# Patient Record
Sex: Male | Born: 1955 | Race: White | Hispanic: No | Marital: Married | Smoking: Former smoker
Health system: Southern US, Community
[De-identification: ages and names within clinical notes are randomized; demographics above are authoritative.]

## PROBLEM LIST (undated history)

## (undated) DIAGNOSIS — T8859XA Other complications of anesthesia, initial encounter: Secondary | ICD-10-CM

## (undated) DIAGNOSIS — C61 Malignant neoplasm of prostate: Secondary | ICD-10-CM

## (undated) DIAGNOSIS — K219 Gastro-esophageal reflux disease without esophagitis: Secondary | ICD-10-CM

## (undated) DIAGNOSIS — I10 Essential (primary) hypertension: Secondary | ICD-10-CM

## (undated) DIAGNOSIS — C801 Malignant (primary) neoplasm, unspecified: Secondary | ICD-10-CM

## (undated) HISTORY — PX: OTHER SURGICAL HISTORY: SHX169

## (undated) HISTORY — PX: EYE SURGERY: SHX253

## (undated) HISTORY — PX: FRACTURE SURGERY: SHX138

## (undated) HISTORY — PX: TONSILLECTOMY: SUR1361

---

## 2010-09-11 DIAGNOSIS — Z8719 Personal history of other diseases of the digestive system: Secondary | ICD-10-CM | POA: Insufficient documentation

## 2010-09-11 DIAGNOSIS — C61 Malignant neoplasm of prostate: Secondary | ICD-10-CM | POA: Insufficient documentation

## 2010-09-11 DIAGNOSIS — C159 Malignant neoplasm of esophagus, unspecified: Secondary | ICD-10-CM | POA: Insufficient documentation

## 2010-09-11 DIAGNOSIS — A6000 Herpesviral infection of urogenital system, unspecified: Secondary | ICD-10-CM | POA: Insufficient documentation

## 2012-03-15 DIAGNOSIS — Z7409 Other reduced mobility: Secondary | ICD-10-CM | POA: Insufficient documentation

## 2012-03-15 DIAGNOSIS — S32001A Stable burst fracture of unspecified lumbar vertebra, initial encounter for closed fracture: Secondary | ICD-10-CM | POA: Insufficient documentation

## 2012-03-15 DIAGNOSIS — W19XXXA Unspecified fall, initial encounter: Secondary | ICD-10-CM | POA: Insufficient documentation

## 2012-03-15 DIAGNOSIS — S52509A Unspecified fracture of the lower end of unspecified radius, initial encounter for closed fracture: Secondary | ICD-10-CM | POA: Insufficient documentation

## 2012-03-15 DIAGNOSIS — S92002A Unspecified fracture of left calcaneus, initial encounter for closed fracture: Secondary | ICD-10-CM | POA: Insufficient documentation

## 2012-03-15 DIAGNOSIS — B3781 Candidal esophagitis: Secondary | ICD-10-CM | POA: Insufficient documentation

## 2012-03-15 DIAGNOSIS — S92251A Displaced fracture of navicular [scaphoid] of right foot, initial encounter for closed fracture: Secondary | ICD-10-CM | POA: Insufficient documentation

## 2012-03-17 DIAGNOSIS — T07XXXA Unspecified multiple injuries, initial encounter: Secondary | ICD-10-CM | POA: Insufficient documentation

## 2012-09-23 DIAGNOSIS — S32009A Unspecified fracture of unspecified lumbar vertebra, initial encounter for closed fracture: Secondary | ICD-10-CM | POA: Insufficient documentation

## 2013-09-08 DIAGNOSIS — H26499 Other secondary cataract, unspecified eye: Secondary | ICD-10-CM | POA: Insufficient documentation

## 2013-11-04 DIAGNOSIS — M62838 Other muscle spasm: Secondary | ICD-10-CM | POA: Insufficient documentation

## 2013-11-04 DIAGNOSIS — K2100 Gastro-esophageal reflux disease with esophagitis, without bleeding: Secondary | ICD-10-CM | POA: Insufficient documentation

## 2018-01-15 DIAGNOSIS — I639 Cerebral infarction, unspecified: Secondary | ICD-10-CM

## 2018-01-15 HISTORY — DX: Cerebral infarction, unspecified: I63.9

## 2018-02-10 ENCOUNTER — Ambulatory Visit: Payer: BC Managed Care – PPO | Admitting: Gastroenterology

## 2018-02-10 ENCOUNTER — Other Ambulatory Visit: Payer: Self-pay

## 2018-02-10 VITALS — BP 155/89 | HR 59 | Wt 213.8 lb

## 2018-02-10 DIAGNOSIS — R1310 Dysphagia, unspecified: Secondary | ICD-10-CM

## 2018-02-10 NOTE — Progress Notes (Signed)
Andrew Bellows MD, MRCP(U.K) [place]  [place], [place] [address]  Main: [phone]  Fax: [phone]   Gastroenterology Consultation  Referring Provider:     Ellamae Sia, MD Primary Care Physician:  Andrew Sia, MD Primary Gastroenterologist:  Dr. Jonathon Mullins  Reason for Consultation:     GERD        HPI:   Andrew Mullins is a 63 y.o. y/o male referred for consultation & management  by Dr. Quay Mullins, Andrew Dach, MD.    She has been referred for history of acid reflux and esophageal stricture.  Prostate cancer 2004- did well . 2010 diagnosed with esophageal cancer. Not a long term smoker. Says he had a high grade tumor - was treated as head and neck , Was too risky for surgery . Underwent chemo and radiation. Cant recall any lymph nodes being affected. Completed treatment in 2010 . He was treated and followed up by Dr Andrew Mullins at [place] [facility] , last seen 3 years back. Says as a side effect of the chemo radiation wasan esophageal stricture and has had about 42 EGD's so far. Was having one every two months and would need to be dilated.   Says presently for the past 7 months since last EGD by Dr Andrew Mullins in [place] where had steroid injection x 2.  Since then he felt good for a long time.   Presently says cant swallow meats- just been eating softer foods.    No past medical history on file.    Prior to Admission medications   Not on File    No family history on file.   Social History   Tobacco Use  . Smoking status: Not on file  Substance Use Topics  . Alcohol use: Not on file  . Drug use: Not on file    Allergies as of 02/10/2018  . (No Known Allergies)    Review of Systems:    All systems reviewed and negative except where noted in HPI.   Physical Exam:  BP (!) 155/89   Pulse (!) 59   Wt 213 lb 12.8 oz (97 kg)  No LMP for male patient. Psych:  Alert and cooperative. Normal mood and affect. General:   Alert,  Well-developed,  well-nourished, pleasant and cooperative in NAD Head:  Normocephalic and atraumatic. Eyes:  Sclera clear, no icterus.   Conjunctiva pink. Ears:  Normal auditory acuity. Nose:  No deformity, discharge, or lesions. Mouth:  No deformity or lesions,oropharynx pink & moist. Neck:  Supple; no masses or thyromegaly. Lungs:  Respirations even and unlabored.  Clear throughout to auscultation.   No wheezes, crackles, or rhonchi. No acute distress. Heart:  Regular rate and rhythm; no murmurs, clicks, rubs, or gallops. Abdomen:  Normal bowel sounds.  No bruits.  Soft, non-tender and non-distended without masses, hepatosplenomegaly or hernias noted.  No guarding or rebound tenderness.    Neurologic:  Alert and oriented x3;  grossly normal neurologically. Skin:  Intact without significant lesions or rashes. No jaundice. Lymph Nodes:  No significant cervical adenopathy. Psych:  Alert and cooperative. Normal mood and affect.  Imaging Studies: No results found.  Assessment and Plan:   Andrew Mullins is a 63 y.o. y/o male has been referred for GERD. His main issue is dysphagia which based on his history of multiple dilations at [facility] is likely from radiation for esophageal cancer causing a stricture.   Plan  1. EGD+ dilation  2. Obtain all records from last  GI office 3. Will confirm at next visit need for colonoscopy    I have discussed alternative options, risks & benefits,  which include, but are not limited to, bleeding, infection, perforation,respiratory complication & drug reaction.  The patient agrees with this plan & written consent will be obtained.    Follow up in 8 weeks   Dr Andrew Bellows MD,MRCP(U.K)

## 2018-02-18 ENCOUNTER — Ambulatory Visit: Payer: BC Managed Care – PPO | Admitting: Anesthesiology

## 2018-02-18 ENCOUNTER — Encounter
Admission: RE | Disposition: A | Discharge status: Home / Self Care | Payer: Self-pay | Source: Home / Self Care | Attending: Gastroenterology

## 2018-02-18 ENCOUNTER — Encounter: Payer: Self-pay | Admitting: Anesthesiology

## 2018-02-18 ENCOUNTER — Ambulatory Visit
Admission: RE | Admit: 2018-02-18 | Discharge: 2018-02-18 | Disposition: A | Discharge status: Home / Self Care | Payer: BC Managed Care – PPO | Attending: Gastroenterology | Admitting: Gastroenterology

## 2018-02-18 DIAGNOSIS — I10 Essential (primary) hypertension: Secondary | ICD-10-CM | POA: Diagnosis not present

## 2018-02-18 DIAGNOSIS — K219 Gastro-esophageal reflux disease without esophagitis: Secondary | ICD-10-CM | POA: Insufficient documentation

## 2018-02-18 DIAGNOSIS — K222 Esophageal obstruction: Secondary | ICD-10-CM | POA: Insufficient documentation

## 2018-02-18 DIAGNOSIS — Z8501 Personal history of malignant neoplasm of esophagus: Secondary | ICD-10-CM | POA: Diagnosis not present

## 2018-02-18 DIAGNOSIS — Z87891 Personal history of nicotine dependence: Secondary | ICD-10-CM | POA: Insufficient documentation

## 2018-02-18 DIAGNOSIS — Z79899 Other long term (current) drug therapy: Secondary | ICD-10-CM | POA: Insufficient documentation

## 2018-02-18 DIAGNOSIS — R1310 Dysphagia, unspecified: Secondary | ICD-10-CM | POA: Diagnosis not present

## 2018-02-18 HISTORY — DX: Gastro-esophageal reflux disease without esophagitis: K21.9

## 2018-02-18 HISTORY — DX: Essential (primary) hypertension: I10

## 2018-02-18 HISTORY — PX: ESOPHAGOGASTRODUODENOSCOPY (EGD) WITH PROPOFOL: SHX5813

## 2018-02-18 SURGERY — ESOPHAGOGASTRODUODENOSCOPY (EGD) WITH PROPOFOL
Anesthesia: General

## 2018-02-18 MED ORDER — GLYCOPYRROLATE 0.2 MG/ML IJ SOLN
INTRAMUSCULAR | Status: DC | PRN
  Administered 2018-02-18: 0.2 mg via INTRAVENOUS

## 2018-02-18 MED ORDER — LIDOCAINE HCL (CARDIAC) PF 100 MG/5ML IV SOSY
PREFILLED_SYRINGE | INTRAVENOUS | Status: DC | PRN
  Administered 2018-02-18: 60 mg via INTRAVENOUS

## 2018-02-18 MED ORDER — PROPOFOL 10 MG/ML IV BOLUS
INTRAVENOUS | Status: DC | PRN
  Administered 2018-02-18: 70 mg via INTRAVENOUS

## 2018-02-18 MED ORDER — PROPOFOL 500 MG/50ML IV EMUL
INTRAVENOUS | Status: DC | PRN
  Administered 2018-02-18: 140 ug/kg/min via INTRAVENOUS

## 2018-02-18 MED ORDER — SODIUM CHLORIDE 0.9 % IV SOLN
INTRAVENOUS | Status: DC
  Administered 2018-02-18: 1000 mL via INTRAVENOUS

## 2018-02-18 NOTE — Anesthesia Post-op Follow-up Note (Signed)
Anesthesia QCDR form completed.        

## 2018-02-18 NOTE — Transfer of Care (Signed)
Immediate Anesthesia Transfer of Care Note  Patient: Andrew Mullins  Procedure(s) Performed: ESOPHAGOGASTRODUODENOSCOPY (EGD) WITH PROPOFOL (N/A )  Patient Location: PACU  Anesthesia Type:General  Level of Consciousness: sedated  Airway & Oxygen Therapy: Patient Spontanous Breathing and Patient connected to nasal cannula oxygen  Post-op Assessment: Report given to RN and Post -op Vital signs reviewed and stable  Post vital signs: Reviewed and stable  Last Vitals:  Vitals Value Taken Time  BP 143/104 02/18/2018  9:53 AM  Temp 36.1 C 02/18/2018  9:53 AM  Pulse 75 02/18/2018  9:54 AM  Resp 14 02/18/2018  9:54 AM  SpO2 99 % 02/18/2018  9:54 AM  Vitals shown include unvalidated device data.  Last Pain:  Vitals:   02/18/18 0953  TempSrc:   PainSc: 0-No pain         Complications: No apparent anesthesia complications

## 2018-02-18 NOTE — Anesthesia Preprocedure Evaluation (Addendum)
Anesthesia Evaluation  Patient identified by MRN, date of birth, ID band Patient awake    Reviewed: Allergy & Precautions, NPO status , Patient's Chart, lab work & pertinent test results, reviewed documented beta blocker date and time   Airway Mallampati: II  TM Distance: >3 FB     Dental  (+) Chipped   Pulmonary former smoker,           Cardiovascular hypertension, Pt. on home beta blockers      Neuro/Psych    GI/Hepatic   Endo/Other    Renal/GU      Musculoskeletal   Abdominal   Peds  Hematology   Anesthesia Other Findings Esophageal Ca with radiation and multiple dilations.  Reproductive/Obstetrics                            Anesthesia Physical Anesthesia Plan  ASA: II  Anesthesia Plan: General   Post-op Pain Management:    Induction: Intravenous  PONV Risk Score and Plan:   Airway Management Planned:   Additional Equipment:   Intra-op Plan:   Post-operative Plan:   Informed Consent: I have reviewed the patients History and Physical, chart, labs and discussed the procedure including the risks, benefits and alternatives for the proposed anesthesia with the patient or authorized representative who has indicated his/her understanding and acceptance.       Plan Discussed with: CRNA  Anesthesia Plan Comments:         Anesthesia Quick Evaluation

## 2018-02-18 NOTE — Anesthesia Postprocedure Evaluation (Signed)
Anesthesia Post Note  Patient: VIC ESCO  Procedure(s) Performed: ESOPHAGOGASTRODUODENOSCOPY (EGD) WITH PROPOFOL (N/A )  Patient location during evaluation: Endoscopy Anesthesia Type: General Level of consciousness: awake and alert Pain management: pain level controlled Vital Signs Assessment: post-procedure vital signs reviewed and stable Respiratory status: spontaneous breathing, nonlabored ventilation, respiratory function stable and patient connected to nasal cannula oxygen Cardiovascular status: blood pressure returned to baseline and stable Postop Assessment: no apparent nausea or vomiting Anesthetic complications: no     Last Vitals:  Vitals:   02/18/18 0953 02/18/18 1012  BP: (!) 143/104 (!) 176/98  Pulse: 85   Resp: 18   Temp: (!) 36.1 C   SpO2: 99%     Last Pain:  Vitals:   02/18/18 1022  TempSrc:   PainSc: 0-No pain                 Beaux Wedemeyer S

## 2018-02-18 NOTE — Op Note (Signed)
[facility] Gastroenterology Patient Name: Andrew Mullins Procedure Date: 02/18/2018 9:36 AM MRN: [id number] Account #: [id number] Date of Birth: [birth year] Admit Type: Outpatient Age: 63 Room: [facility] ENDO ROOM 1 Gender: Male Note Status: Finalized Procedure:            Upper GI endoscopy Indications:          Dysphagia Providers:            [name] MD, MD Referring MD:         Remus Blake MD, MD (Referring MD) Medicines:            Monitored Anesthesia Care Complications:        No immediate complications. Procedure:            Pre-Anesthesia Assessment:                       - Prior to the procedure, a History and Physical was                        performed, and patient medications, allergies and                        sensitivities were reviewed. The patient's tolerance of                        previous anesthesia was reviewed.                       - The risks and benefits of the procedure and the                        sedation options and risks were discussed with the                        patient. All questions were answered and informed                        consent was obtained.                       - ASA Grade Assessment: II - A patient with mild                        systemic disease.                       After obtaining informed consent, the endoscope was                        passed under direct vision. Throughout the procedure,                        the patient's blood pressure, pulse, and oxygen                        saturations were monitored continuously. The Endoscope                        was introduced through the mouth, and advanced to the  third part of duodenum. The upper GI endoscopy was                        accomplished with ease. The patient tolerated the                        procedure well. Findings:      The examined duodenum was normal.      The stomach was normal.      The cardia and  gastric fundus were normal on retroflexion.      One benign-appearing, intrinsic moderate (circumferential scarring or       stenosis; an endoscope may pass) stenosis was found 25 cm from the       incisors. This stenosis measured 1.1 cm (inner diameter) x 1 cm (in       length). The stenosis was traversed. A TTS dilator was passed through       the scope. Dilation with a 12-13.5-15 mm balloon dilator was performed       to 13.5 mm. The dilation site was examined and showed complete       resolution of luminal narrowing. Impression:           - Normal examined duodenum.                       - Normal stomach.                       - Benign-appearing esophageal stenosis. Dilated.                       - No specimens collected. Recommendation:       - Discharge patient to home (with escort).                       - Resume previous diet.                       - Continue present medications.                       - Repeat upper endoscopy in 2 weeks to evaluate the                        response to therapy. Procedure Code(s):    --- Professional ---                       [phone], Esophagogastroduodenoscopy, flexible, transoral;                        with transendoscopic balloon dilation of esophagus                        (less than 30 mm diameter) Diagnosis Code(s):    --- Professional ---                       K22.2, Esophageal obstruction                       R13.10, Dysphagia, unspecified CPT copyright 2018 American Medical Association. All rights reserved. The codes documented in this report are preliminary and upon coder review may  be revised to  meet current compliance requirements. [name], MD [name] MD, MD 02/18/2018 9:50:01 AM This report has been signed electronically. Number of Addenda: 0 Note Initiated On: 02/18/2018 9:36 AM      [facility]

## 2018-02-19 ENCOUNTER — Encounter: Payer: Self-pay | Admitting: Gastroenterology

## 2018-02-20 NOTE — H&P (Signed)
Andrew Bellows, MD [address], [place], [place], [place], [address] [address], [place], [place], [place], [address] Phone: [phone]  Fax: [phone]  Primary Care Physician:  Ellamae Sia, MD   Pre-Procedure History & Physical: HPI:  Andrew Mullins is a 63 y.o. male is here for an endoscopy    Past Medical History:  Diagnosis Date  . GERD (gastroesophageal reflux disease)   . Hypertension     Past Surgical History:  Procedure Laterality Date  . broken bones repair    . ESOPHAGOGASTRODUODENOSCOPY (EGD) WITH PROPOFOL N/A 02/18/2018   Procedure: ESOPHAGOGASTRODUODENOSCOPY (EGD) WITH PROPOFOL;  Surgeon: Andrew Bellows, MD;  Location: [facility] ENDOSCOPY;  Service: Gastroenterology;  Laterality: N/A;  . EYE SURGERY     retinal detatchment  . FRACTURE SURGERY    . radical prostate    . TONSILLECTOMY      Prior to Admission medications   Medication Sig Start Date End Date Taking? Authorizing Provider  famciclovir (FAMVIR) 250 MG tablet Take 250 mg by mouth 2 (two) times daily.   Yes [provider]  losartan (COZAAR) 25 MG tablet Take 25 mg by mouth daily.   Yes [provider]  metoprolol tartrate (LOPRESSOR) 25 MG tablet Take 25 mg by mouth 2 (two) times daily.   Yes [provider]  omeprazole (PRILOSEC) 20 MG capsule Take 20 mg by mouth daily.   Yes [provider]    Allergies as of 02/10/2018  . (No Known Allergies)    History reviewed. No pertinent family history.  Social History   Socioeconomic History  . Marital status: Married    Spouse name: Not on file  . Number of children: Not on file  . Years of education: Not on file  . Highest education level: Not on file  Occupational History  . Not on file  Social Needs  . Financial resource strain: Not on file  . Food insecurity:    Worry: Not on file    Inability: Not on file  . Transportation needs:    Medical: Not on file    Non-medical: Not on file    Tobacco Use  . Smoking status: Former Research scientist (life sciences)  . Smokeless tobacco: Never Used  Substance and Sexual Activity  . Alcohol use: Not on file  . Drug use: Not on file  . Sexual activity: Not on file  Lifestyle  . Physical activity:    Days per week: Not on file    Minutes per session: Not on file  . Stress: Not on file  Relationships  . Social connections:    Talks on phone: Not on file    Gets together: Not on file    Attends religious service: Not on file    Active member of club or organization: Not on file    Attends meetings of clubs or organizations: Not on file    Relationship status: Not on file  . Intimate partner violence:    Fear of current or ex partner: Not on file    Emotionally abused: Not on file    Physically abused: Not on file    Forced sexual activity: Not on file  Other Topics Concern  . Not on file  Social History Narrative  . Not on file    Review of Systems: See HPI, otherwise negative ROS  Physical Exam: BP (!) 176/98   Pulse 85   Temp (!) 97 F (36.1 C)   Resp 18  Ht 6' (1.829 m)   Wt 96.9 kg   SpO2 99%   BMI 28.97 kg/m  General:   Alert,  pleasant and cooperative in NAD Head:  Normocephalic and atraumatic. Neck:  Supple; no masses or thyromegaly. Lungs:  Clear throughout to auscultation, normal respiratory effort.    Heart:  +S1, +S2, Regular rate and rhythm, No edema. Abdomen:  Soft, nontender and nondistended. Normal bowel sounds, without guarding, and without rebound.   Neurologic:  Alert and  oriented x4;  grossly normal neurologically.  Impression/Plan: Andrew Mullins is here for an endoscopy  to be performed for  evaluation of dysphagia    Risks, benefits, limitations, and alternatives regarding endoscopy and dialtion have been reviewed with the patient.  Questions have been answered.  All parties agreeable.   Andrew Bellows, MD  02/20/2018, 8:16 AM

## 2018-02-21 ENCOUNTER — Telehealth: Payer: Self-pay

## 2018-02-21 NOTE — Telephone Encounter (Signed)
Called pt to schedule a repeat EGD. Pt states he has to confirm he and his wife's availability, he plans to contact our office when he's ready to schedule.

## 2018-02-21 NOTE — Telephone Encounter (Signed)
-----   Message from [name], MD sent at 02/18/2018 10:00 AM EST ----- Regarding: please arrange appointment  Andrew Mullins  Please arrange EGD with dilation in 2 weeks    Regards    Dr [name]  Gastroenterology/Hepatology Pager: [phone]

## 2018-03-26 ENCOUNTER — Telehealth: Payer: Self-pay | Admitting: Gastroenterology

## 2018-03-26 ENCOUNTER — Other Ambulatory Visit: Payer: Self-pay

## 2018-03-26 DIAGNOSIS — R1310 Dysphagia, unspecified: Secondary | ICD-10-CM

## 2018-03-26 NOTE — Telephone Encounter (Signed)
Spoke with pt and was able to schedule the repeat EGD procedure as instructed by Dr. Vicente Males. Pt plans to pick up the printed instructions as well as sign a medical release form to receive records from his previous GI provider.

## 2018-03-26 NOTE — Telephone Encounter (Signed)
Pt left vm he missed a call he think it may be to schedule an apt

## 2018-04-01 ENCOUNTER — Other Ambulatory Visit: Payer: Self-pay

## 2018-04-01 ENCOUNTER — Ambulatory Visit: Payer: BC Managed Care – PPO | Admitting: Anesthesiology

## 2018-04-01 ENCOUNTER — Encounter
Admission: RE | Disposition: A | Discharge status: Home / Self Care | Payer: Self-pay | Source: Home / Self Care | Attending: Gastroenterology

## 2018-04-01 ENCOUNTER — Ambulatory Visit
Admission: RE | Admit: 2018-04-01 | Discharge: 2018-04-01 | Disposition: A | Discharge status: Home / Self Care | Payer: BC Managed Care – PPO | Attending: Gastroenterology | Admitting: Gastroenterology

## 2018-04-01 DIAGNOSIS — I10 Essential (primary) hypertension: Secondary | ICD-10-CM | POA: Diagnosis not present

## 2018-04-01 DIAGNOSIS — Z79899 Other long term (current) drug therapy: Secondary | ICD-10-CM | POA: Diagnosis not present

## 2018-04-01 DIAGNOSIS — K222 Esophageal obstruction: Secondary | ICD-10-CM | POA: Diagnosis not present

## 2018-04-01 DIAGNOSIS — R1310 Dysphagia, unspecified: Secondary | ICD-10-CM | POA: Insufficient documentation

## 2018-04-01 DIAGNOSIS — K219 Gastro-esophageal reflux disease without esophagitis: Secondary | ICD-10-CM | POA: Insufficient documentation

## 2018-04-01 DIAGNOSIS — Z87891 Personal history of nicotine dependence: Secondary | ICD-10-CM | POA: Insufficient documentation

## 2018-04-01 HISTORY — PX: ESOPHAGOGASTRODUODENOSCOPY (EGD) WITH PROPOFOL: SHX5813

## 2018-04-01 SURGERY — ESOPHAGOGASTRODUODENOSCOPY (EGD) WITH PROPOFOL
Anesthesia: General

## 2018-04-01 MED ORDER — LIDOCAINE HCL (PF) 2 % IJ SOLN
INTRAMUSCULAR | Status: AC
  Filled 2018-04-01: qty 10

## 2018-04-01 MED ORDER — FENTANYL CITRATE (PF) 100 MCG/2ML IJ SOLN
INTRAMUSCULAR | Status: AC
  Filled 2018-04-01: qty 2

## 2018-04-01 MED ORDER — GLYCOPYRROLATE 0.2 MG/ML IJ SOLN
INTRAMUSCULAR | Status: DC | PRN
  Administered 2018-04-01: 0.2 mg via INTRAVENOUS

## 2018-04-01 MED ORDER — FENTANYL CITRATE (PF) 100 MCG/2ML IJ SOLN
INTRAMUSCULAR | Status: DC | PRN
  Administered 2018-04-01: 50 ug via INTRAVENOUS

## 2018-04-01 MED ORDER — LIDOCAINE HCL (CARDIAC) PF 100 MG/5ML IV SOSY
PREFILLED_SYRINGE | INTRAVENOUS | Status: DC | PRN
  Administered 2018-04-01: 80 mg via INTRAVENOUS

## 2018-04-01 MED ORDER — PROPOFOL 10 MG/ML IV BOLUS
INTRAVENOUS | Status: DC | PRN
  Administered 2018-04-01: 50 mg via INTRAVENOUS
  Administered 2018-04-01 (×2): 20 mg via INTRAVENOUS

## 2018-04-01 MED ORDER — PROPOFOL 500 MG/50ML IV EMUL
INTRAVENOUS | Status: AC
  Filled 2018-04-01: qty 50

## 2018-04-01 MED ORDER — SODIUM CHLORIDE 0.9 % IV SOLN
INTRAVENOUS | Status: DC
  Administered 2018-04-01: 10:00:00 via INTRAVENOUS

## 2018-04-01 NOTE — Op Note (Signed)
[facility] Gastroenterology Patient Name: Andrew Mullins Procedure Date: 04/01/2018 10:20 AM MRN: [id number] Account #: [id number] Date of Birth: 1955/02/16 Admit Type: Outpatient Age: 63 Room: [facility] ENDO ROOM 1 Gender: Male Note Status: Finalized Procedure:            Upper GI endoscopy Indications:          Dysphagia Providers:            [name] MD, MD Medicines:            Monitored Anesthesia Care Complications:        No immediate complications. Procedure:            Pre-Anesthesia Assessment:                       - Prior to the procedure, a History and Physical was                        performed, and patient medications, allergies and                        sensitivities were reviewed. The patient's tolerance of                        previous anesthesia was reviewed.                       - The risks and benefits of the procedure and the                        sedation options and risks were discussed with the                        patient. All questions were answered and informed                        consent was obtained.                       - ASA Grade Assessment: II - A patient with mild                        systemic disease.                       After obtaining informed consent, the endoscope was                        passed under direct vision. Throughout the procedure,                        the patient's blood pressure, pulse, and oxygen                        saturations were monitored continuously. The Endoscope                        was introduced through the mouth, and advanced to the                        third part of duodenum. The upper GI endoscopy was  accomplished with ease. The patient tolerated the                        procedure well. Findings:      One benign-appearing, intrinsic moderate (circumferential scarring or       stenosis; an endoscope may pass) stenosis was found 30 cm from the      incisors. This stenosis measured 1.4 cm (inner diameter) x less than one       cm (in length). The stenosis was traversed. A TTS dilator was passed       through the scope. Dilation with a 12-13.5-15 mm balloon dilator was       performed to 15 mm. The dilation site was examined and showed complete       resolution of luminal narrowing.      The stomach was normal.      The examined duodenum was normal.      The cardia and gastric fundus were normal on retroflexion. Impression:           - Benign-appearing esophageal stenosis. Dilated.                       - Normal stomach.                       - Normal examined duodenum.                       - No specimens collected. Recommendation:       - Discharge patient to home (with escort).                       - Resume previous diet.                       - Continue present medications.                       - Return to my office PRN. Procedure Code(s):    --- Professional ---                       [phone], Esophagogastroduodenoscopy, flexible, transoral;                        with transendoscopic balloon dilation of esophagus                        (less than 30 mm diameter) Diagnosis Code(s):    --- Professional ---                       K22.2, Esophageal obstruction                       R13.10, Dysphagia, unspecified CPT copyright 2018 American Medical Association. All rights reserved. The codes documented in this report are preliminary and upon coder review may  be revised to meet current compliance requirements. [name], MD [name] MD, MD 04/01/2018 10:39:53 AM This report has been signed electronically. Number of Addenda: 0 Note Initiated On: 04/01/2018 10:20 AM      [facility]

## 2018-04-01 NOTE — H&P (Signed)
[name], MD [address] [place], [place], [place], [address] [address], [place], [place], [place], [address] Phone: [phone]  Fax: [phone]  Primary Care Physician:  Ellamae Sia, MD   Pre-Procedure History & Physical: HPI:  Andrew Mullins is a 63 y.o. male is here for an endoscopy    Past Medical History:  Diagnosis Date  . GERD (gastroesophageal reflux disease)   . Hypertension     Past Surgical History:  Procedure Laterality Date  . broken bones repair    . ESOPHAGOGASTRODUODENOSCOPY (EGD) WITH PROPOFOL N/A 02/18/2018   Procedure: ESOPHAGOGASTRODUODENOSCOPY (EGD) WITH PROPOFOL;  Surgeon: [name], MD;  Location: [facility] ENDOSCOPY;  Service: Gastroenterology;  Laterality: N/A;  . EYE SURGERY     retinal detatchment  . FRACTURE SURGERY    . radical prostate    . TONSILLECTOMY      Prior to Admission medications   Medication Sig Start Date End Date Taking? Authorizing Provider  famciclovir (FAMVIR) 250 MG tablet Take 250 mg by mouth 2 (two) times daily.   Yes [provider]  losartan (COZAAR) 25 MG tablet Take 25 mg by mouth daily.   Yes [provider]  metoprolol tartrate (LOPRESSOR) 25 MG tablet Take 25 mg by mouth 2 (two) times daily.   Yes [provider]  omeprazole (PRILOSEC) 20 MG capsule Take 20 mg by mouth daily.   Yes [provider]    Allergies as of 03/26/2018  . (No Known Allergies)    No family history on file.  Social History   Socioeconomic History  . Marital status: Married    Spouse name: Not on file  . Number of children: Not on file  . Years of education: Not on file  . Highest education level: Not on file  Occupational History  . Not on file  Social Needs  . Financial resource strain: Not on file  . Food insecurity:    Worry: Not on file    Inability: Not on file  . Transportation needs:    Medical: Not on file    Non-medical: Not on file  Tobacco Use  . Smoking  status: Former Research scientist (life sciences)  . Smokeless tobacco: Never Used  Substance and Sexual Activity  . Alcohol use: Not on file  . Drug use: Not on file  . Sexual activity: Not on file  Lifestyle  . Physical activity:    Days per week: Not on file    Minutes per session: Not on file  . Stress: Not on file  Relationships  . Social connections:    Talks on phone: Not on file    Gets together: Not on file    Attends religious service: Not on file    Active member of club or organization: Not on file    Attends meetings of clubs or organizations: Not on file    Relationship status: Not on file  . Intimate partner violence:    Fear of current or ex partner: Not on file    Emotionally abused: Not on file    Physically abused: Not on file    Forced sexual activity: Not on file  Other Topics Concern  . Not on file  Social History Narrative  . Not on file    Review of Systems: See HPI, otherwise negative ROS  Physical Exam: BP (!) 159/95   Pulse (!) 59   Temp (!) 96 F (35.6 C) (Tympanic)   Resp 16  Ht 6' (1.829 m)   Wt 93 kg   SpO2 100%   BMI 27.80 kg/m  General:   Alert,  pleasant and cooperative in NAD Head:  Normocephalic and atraumatic. Neck:  Supple; no masses or thyromegaly. Lungs:  Clear throughout to auscultation, normal respiratory effort.    Heart:  +S1, +S2, Regular rate and rhythm, No edema. Abdomen:  Soft, nontender and nondistended. Normal bowel sounds, without guarding, and without rebound.   Neurologic:  Alert and  oriented x4;  grossly normal neurologically.  Impression/Plan: Andrew Mullins is here for an endoscopy  to be performed for  evaluation of dysphagia     Risks, benefits, limitations, and alternatives regarding endoscopy and dilation have been reviewed with the patient.  Questions have been answered.  All parties agreeable.   [name], MD  04/01/2018, 9:56 AM

## 2018-04-01 NOTE — Anesthesia Post-op Follow-up Note (Signed)
Anesthesia QCDR form completed.        

## 2018-04-01 NOTE — Transfer of Care (Signed)
Immediate Anesthesia Transfer of Care Note  Patient: Andrew Mullins  Procedure(s) Performed: ESOPHAGOGASTRODUODENOSCOPY (EGD) WITH PROPOFOL (N/A )  Patient Location: PACU  Anesthesia Type:General  Level of Consciousness: awake, alert  and oriented  Airway & Oxygen Therapy: Patient Spontanous Breathing and Patient connected to nasal cannula oxygen  Post-op Assessment: Report given to RN and Post -op Vital signs reviewed and stable  Post vital signs: Reviewed and stable  Last Vitals:  Vitals Value Taken Time  BP 141/90 04/01/2018 10:47 AM  Temp 35.7 C 04/01/2018 10:40 AM  Pulse 60 04/01/2018 10:48 AM  Resp 13 04/01/2018 10:48 AM  SpO2 98 % 04/01/2018 10:48 AM  Vitals shown include unvalidated device data.  Last Pain:  Vitals:   04/01/18 1040  TempSrc: Tympanic  PainSc:          Complications: No apparent anesthesia complications

## 2018-04-01 NOTE — Anesthesia Preprocedure Evaluation (Signed)
Anesthesia Evaluation  Patient identified by MRN, date of birth, ID band Patient awake    Reviewed: Allergy & Precautions, NPO status , Patient's Chart, lab work & pertinent test results  History of Anesthesia Complications Negative for: history of anesthetic complications  Airway Mallampati: III  TM Distance: >3 FB Neck ROM: Full    Dental no notable dental hx.    Pulmonary neg sleep apnea, neg COPD, former smoker,    breath sounds clear to auscultation- rhonchi (-) wheezing      Cardiovascular hypertension, Pt. on medications (-) CAD, (-) Past MI, (-) Cardiac Stents and (-) CABG  Rhythm:Regular Rate:Normal - Systolic murmurs and - Diastolic murmurs    Neuro/Psych neg Seizures negative neurological ROS  negative psych ROS   GI/Hepatic Neg liver ROS, GERD  ,  Endo/Other  negative endocrine ROSneg diabetes  Renal/GU negative Renal ROS     Musculoskeletal negative musculoskeletal ROS (+)   Abdominal (+) - obese,   Peds  Hematology negative hematology ROS (+)   Anesthesia Other Findings Past Medical History: No date: GERD (gastroesophageal reflux disease) No date: Hypertension   Reproductive/Obstetrics                             Anesthesia Physical Anesthesia Plan  ASA: II  Anesthesia Plan: General   Post-op Pain Management:    Induction: Intravenous  PONV Risk Score and Plan: 1 and Propofol infusion  Airway Management Planned: Natural Airway  Additional Equipment:   Intra-op Plan:   Post-operative Plan:   Informed Consent: I have reviewed the patients History and Physical, chart, labs and discussed the procedure including the risks, benefits and alternatives for the proposed anesthesia with the patient or authorized representative who has indicated his/her understanding and acceptance.     Dental advisory given  Plan Discussed with: CRNA and  Anesthesiologist  Anesthesia Plan Comments:         Anesthesia Quick Evaluation

## 2018-04-01 NOTE — Anesthesia Postprocedure Evaluation (Signed)
Anesthesia Post Note  Patient: Andrew Mullins  Procedure(s) Performed: ESOPHAGOGASTRODUODENOSCOPY (EGD) WITH PROPOFOL (N/A )  Patient location during evaluation: Endoscopy Anesthesia Type: General Level of consciousness: awake and alert and oriented Pain management: pain level controlled Vital Signs Assessment: post-procedure vital signs reviewed and stable Respiratory status: spontaneous breathing, nonlabored ventilation and respiratory function stable Cardiovascular status: blood pressure returned to baseline and stable Postop Assessment: no signs of nausea or vomiting Anesthetic complications: no     Last Vitals:  Vitals:   04/01/18 1110 04/01/18 1120  BP: (!) 157/92 (!) 157/93  Pulse: (!) 57 (!) 58  Resp: 15 13  Temp:    SpO2: 98% 100%    Last Pain:  Vitals:   04/01/18 1040  TempSrc: Tympanic  PainSc:                  Andrew Mullins

## 2018-04-02 ENCOUNTER — Encounter: Payer: Self-pay | Admitting: Gastroenterology

## 2018-09-16 ENCOUNTER — Other Ambulatory Visit: Payer: Self-pay | Admitting: Family Medicine

## 2018-09-16 DIAGNOSIS — R270 Ataxia, unspecified: Secondary | ICD-10-CM

## 2018-09-17 ENCOUNTER — Encounter: Payer: Self-pay | Admitting: Emergency Medicine

## 2018-09-17 ENCOUNTER — Other Ambulatory Visit: Payer: Self-pay

## 2018-09-17 ENCOUNTER — Emergency Department: Payer: BC Managed Care – PPO

## 2018-09-17 ENCOUNTER — Observation Stay: Payer: BC Managed Care – PPO

## 2018-09-17 ENCOUNTER — Inpatient Hospital Stay
Admission: EM | Admit: 2018-09-17 | Discharge: 2018-09-18 | DRG: 065 | Disposition: A | Discharge status: Home / Self Care | Payer: BC Managed Care – PPO | Attending: Internal Medicine | Admitting: Internal Medicine

## 2018-09-17 DIAGNOSIS — K219 Gastro-esophageal reflux disease without esophagitis: Secondary | ICD-10-CM | POA: Diagnosis present

## 2018-09-17 DIAGNOSIS — R531 Weakness: Secondary | ICD-10-CM | POA: Insufficient documentation

## 2018-09-17 DIAGNOSIS — K222 Esophageal obstruction: Secondary | ICD-10-CM | POA: Diagnosis present

## 2018-09-17 DIAGNOSIS — Z79899 Other long term (current) drug therapy: Secondary | ICD-10-CM

## 2018-09-17 DIAGNOSIS — R29700 NIHSS score 0: Secondary | ICD-10-CM | POA: Diagnosis present

## 2018-09-17 DIAGNOSIS — Z20828 Contact with and (suspected) exposure to other viral communicable diseases: Secondary | ICD-10-CM | POA: Diagnosis present

## 2018-09-17 DIAGNOSIS — I10 Essential (primary) hypertension: Secondary | ICD-10-CM | POA: Diagnosis present

## 2018-09-17 DIAGNOSIS — Z9079 Acquired absence of other genital organ(s): Secondary | ICD-10-CM

## 2018-09-17 DIAGNOSIS — Z8501 Personal history of malignant neoplasm of esophagus: Secondary | ICD-10-CM

## 2018-09-17 DIAGNOSIS — Z9221 Personal history of antineoplastic chemotherapy: Secondary | ICD-10-CM

## 2018-09-17 DIAGNOSIS — I69351 Hemiplegia and hemiparesis following cerebral infarction affecting right dominant side: Secondary | ICD-10-CM

## 2018-09-17 DIAGNOSIS — Z923 Personal history of irradiation: Secondary | ICD-10-CM

## 2018-09-17 DIAGNOSIS — M21371 Foot drop, right foot: Secondary | ICD-10-CM | POA: Diagnosis present

## 2018-09-17 DIAGNOSIS — Z87891 Personal history of nicotine dependence: Secondary | ICD-10-CM

## 2018-09-17 DIAGNOSIS — I639 Cerebral infarction, unspecified: Secondary | ICD-10-CM | POA: Diagnosis not present

## 2018-09-17 DIAGNOSIS — Z8546 Personal history of malignant neoplasm of prostate: Secondary | ICD-10-CM

## 2018-09-17 HISTORY — DX: Malignant (primary) neoplasm, unspecified: C80.1

## 2018-09-17 LAB — URINALYSIS, COMPLETE (UACMP) WITH MICROSCOPIC
Bacteria, UA: NONE SEEN
Bilirubin Urine: NEGATIVE
Glucose, UA: NEGATIVE mg/dL
Ketones, ur: NEGATIVE mg/dL
Leukocytes,Ua: NEGATIVE
Nitrite: NEGATIVE
Protein, ur: NEGATIVE mg/dL
Specific Gravity, Urine: 1.004 — ABNORMAL LOW (ref 1.005–1.030)
Squamous Epithelial / HPF: NONE SEEN (ref 0–5)
pH: 7 (ref 5.0–8.0)

## 2018-09-17 LAB — CBC WITH DIFFERENTIAL/PLATELET
Abs Immature Granulocytes: 0.02 10*3/uL (ref 0.00–0.07)
Basophils Absolute: 0 10*3/uL (ref 0.0–0.1)
Basophils Relative: 1 %
Eosinophils Absolute: 0.1 10*3/uL (ref 0.0–0.5)
Eosinophils Relative: 2 %
HCT: 40.1 % (ref 39.0–52.0)
Hemoglobin: 13.3 g/dL (ref 13.0–17.0)
Immature Granulocytes: 0 %
Lymphocytes Relative: 21 %
Lymphs Abs: 1.3 10*3/uL (ref 0.7–4.0)
MCH: 28.4 pg (ref 26.0–34.0)
MCHC: 33.2 g/dL (ref 30.0–36.0)
MCV: 85.5 fL (ref 80.0–100.0)
Monocytes Absolute: 0.6 10*3/uL (ref 0.1–1.0)
Monocytes Relative: 11 %
Neutro Abs: 4 10*3/uL (ref 1.7–7.7)
Neutrophils Relative %: 65 %
Platelets: 181 10*3/uL (ref 150–400)
RBC: 4.69 MIL/uL (ref 4.22–5.81)
RDW: 12.8 % (ref 11.5–15.5)
WBC: 6.1 10*3/uL (ref 4.0–10.5)
nRBC: 0 % (ref 0.0–0.2)

## 2018-09-17 LAB — COMPREHENSIVE METABOLIC PANEL
ALT: 10 U/L (ref 0–44)
AST: 15 U/L (ref 15–41)
Albumin: 3.5 g/dL (ref 3.5–5.0)
Alkaline Phosphatase: 52 U/L (ref 38–126)
Anion gap: 6 (ref 5–15)
BUN: 12 mg/dL (ref 8–23)
CO2: 26 mmol/L (ref 22–32)
Calcium: 8.5 mg/dL — ABNORMAL LOW (ref 8.9–10.3)
Chloride: 106 mmol/L (ref 98–111)
Creatinine, Ser: 0.72 mg/dL (ref 0.61–1.24)
GFR calc Af Amer: >60 mL/min (ref >60)
GFR calc non Af Amer: >60 mL/min (ref >60)
Glucose, Bld: 105 mg/dL — ABNORMAL HIGH (ref 70–99)
Potassium: 3.8 mmol/L (ref 3.5–5.1)
Sodium: 138 mmol/L (ref 135–145)
Total Bilirubin: 0.4 mg/dL (ref 0.3–1.2)
Total Protein: 6.5 g/dL (ref 6.5–8.1)

## 2018-09-17 LAB — TROPONIN I (HIGH SENSITIVITY): Troponin I (High Sensitivity): 3 ng/L (ref <18)

## 2018-09-17 LAB — SARS CORONAVIRUS 2 (TAT 6-24 HRS): SARS Coronavirus 2: NEGATIVE

## 2018-09-17 MED ORDER — STROKE: EARLY STAGES OF RECOVERY BOOK
Freq: Once | Status: AC
  Administered 2018-09-17: 17:00:00

## 2018-09-17 MED ORDER — ASPIRIN 81 MG PO CHEW
324.0000 mg | CHEWABLE_TABLET | Freq: Once | ORAL | Status: AC
  Administered 2018-09-17: 14:00:00 324 mg via ORAL
  Filled 2018-09-17: qty 4

## 2018-09-17 MED ORDER — ACETAMINOPHEN 160 MG/5ML PO SOLN
650.0000 mg | ORAL | Status: DC | PRN
  Filled 2018-09-17: qty 20.3

## 2018-09-17 MED ORDER — ASPIRIN EC 325 MG PO TBEC
325.0000 mg | DELAYED_RELEASE_TABLET | Freq: Every day | ORAL | Status: DC
  Administered 2018-09-18: 325 mg via ORAL
  Filled 2018-09-17: qty 1

## 2018-09-17 MED ORDER — ACETAMINOPHEN 650 MG RE SUPP: 650.0000 mg | RECTAL | Status: DC | PRN

## 2018-09-17 MED ORDER — ENOXAPARIN SODIUM 40 MG/0.4ML ~~LOC~~ SOLN
40.0000 mg | SUBCUTANEOUS | Status: DC
  Administered 2018-09-17: 40 mg via SUBCUTANEOUS
  Filled 2018-09-17: qty 0.4

## 2018-09-17 MED ORDER — ACETAMINOPHEN 325 MG PO TABS: 650.0000 mg | ORAL_TABLET | ORAL | Status: DC | PRN

## 2018-09-17 NOTE — Progress Notes (Signed)
Family Meeting Note  Advance Directive:yes  Today a meeting took place with the Patient.     The following clinical team members were present during this meeting:MD  The following were discussed:Patient's diagnosis: Acute right-sided weakness clinical CVA, hypertension, GERD, history of esophageal cancer status post chemoradiation therapy residual esophageal stenosis, history of prostate cancer will be admitted to the hospital for stroke work-up.  Initial CT head is negative.  Treatment plan of care discussed in detail with the patient and his wife at bedside.  They both verbalized understanding of the plan   patient's progosis: Unable to determine and Goals for treatment: Full Code  Wife Macky Lower is the healthcare power of attorney  Additional follow-up to be provided: Hospitalist and neurology  Time spent during discussion:17 min  Nicholes Mango, MD

## 2018-09-17 NOTE — ED Triage Notes (Signed)
Patient presents to ED via POV from home with c/o right sided weakness. Patient reports last known well Sunday night 2300. Patient reports at 0500 on Monday he got up and went to the bathroom and noticed his right leg dragging behind him. A&O x4. No slurred speech. 5/5 bilateral hand grasps noted. Patient reports still feeling "off".

## 2018-09-17 NOTE — ED Provider Notes (Signed)
[facility] Emergency Department Provider Note       Time seen: ----------------------------------------- 11:54 AM on 09/17/2018 -----------------------------------------   I have reviewed the triage vital signs and the nursing notes.  HISTORY   Chief Complaint Transient Ischemic Attack    HPI Andrew Mullins is a 63 y.o. male with a history of cancer, GERD, hypertension who presents to the ED for right-sided weakness.  Patient reports last known well was Sunday night at 11 PM.  Patient reports 5:00 on Monday got up went to the bathroom noticed his right leg was dragging behind him.  She reports feeling off at this point.  Past Medical History:  Diagnosis Date  . Cancer ([place])   . GERD (gastroesophageal reflux disease)   . Hypertension     There are no active problems to display for this patient.   Past Surgical History:  Procedure Laterality Date  . broken bones repair    . ESOPHAGOGASTRODUODENOSCOPY (EGD) WITH PROPOFOL N/A 02/18/2018   Procedure: ESOPHAGOGASTRODUODENOSCOPY (EGD) WITH PROPOFOL;  Surgeon: [name], MD;  Location: [facility] ENDOSCOPY;  Service: Gastroenterology;  Laterality: N/A;  . ESOPHAGOGASTRODUODENOSCOPY (EGD) WITH PROPOFOL N/A 04/01/2018   Procedure: ESOPHAGOGASTRODUODENOSCOPY (EGD) WITH PROPOFOL;  Surgeon: [name], MD;  Location: [facility] ENDOSCOPY;  Service: Gastroenterology;  Laterality: N/A;  . EYE SURGERY     retinal detatchment  . FRACTURE SURGERY    . radical prostate    . TONSILLECTOMY      Allergies Patient has no known allergies.  Social History Social History   Tobacco Use  . Smoking status: Former Research scientist (life sciences)  . Smokeless tobacco: Never Used  Substance Use Topics  . Alcohol use: Not on file  . Drug use: Not on file   Review of Systems Constitutional: Negative for fever. Cardiovascular: Negative for chest pain. Respiratory: Negative for shortness of breath. Gastrointestinal: Negative for abdominal pain,  vomiting and diarrhea. Musculoskeletal: Negative for back pain. Skin: Negative for rash. Neurological: Positive for right-sided weakness  All systems negative/normal/unremarkable except as stated in the HPI  ____________________________________________   PHYSICAL EXAM:  VITAL SIGNS: ED Triage Vitals  Enc Vitals Group     BP 09/17/18 1136 (!) 155/93     Pulse Rate 09/17/18 1136 (!) 57     Resp 09/17/18 1136 16     Temp 09/17/18 1136 98.5 F (36.9 C)     Temp Source 09/17/18 1136 Oral     SpO2 09/17/18 1136 99 %     Weight 09/17/18 1147 210 lb (95.3 kg)     Height 09/17/18 1147 6' (1.829 m)     Head Circumference --      Peak Flow --      Pain Score 09/17/18 1147 0     Pain Loc --      Pain Edu? --      Excl. in [place]? --    Constitutional: Alert and oriented. Well appearing and in no distress. Eyes: Conjunctivae are normal. Normal extraocular movements. Cardiovascular: Normal rate, regular rhythm. No murmurs, rubs, or gallops. Respiratory: Normal respiratory effort without tachypnea nor retractions. Breath sounds are clear and equal bilaterally. No wheezes/rales/rhonchi. Gastrointestinal: Soft and nontender. Normal bowel sounds Musculoskeletal: Nontender with normal range of motion in extremities. No lower extremity tenderness nor edema. Neurologic:  Normal speech and language. No gross focal neurologic deficits are appreciated.  Patient describes intrinsic hand muscle weakness and difficulty moving his arm and right leg but I cannot appreciate any sensory or motor deficits. Skin:  Skin is warm, dry and intact. No rash noted. Psychiatric: Mood and affect are normal. Speech and behavior are normal.  ____________________________________________  EKG: Interpreted by me.  Sinus rhythm rate 52 bpm, septal infarct, nonspecific ST segment changes, normal axis, normal QT  ____________________________________________  ED COURSE:  As part of my medical decision making, I reviewed  the following data within the [facility] History obtained from family if available, nursing notes, old chart and ekg, as well as notes from prior ED visits. Patient presented for right-sided weakness, we will assess with labs and imaging as indicated at this time.   Procedures  Andrew Mullins was evaluated in Emergency Department on 09/17/2018 for the symptoms described in the history of present illness. He was evaluated in the context of the global COVID-19 pandemic, which necessitated consideration that the patient might be at risk for infection with the SARS-CoV-2 virus that causes COVID-19. Institutional protocols and algorithms that pertain to the evaluation of patients at risk for COVID-19 are in a state of rapid change based on information released by regulatory bodies including the CDC and federal and state organizations. These policies and algorithms were followed during the patient's care in the ED.  ____________________________________________   LABS (pertinent positives/negatives)  Labs Reviewed  COMPREHENSIVE METABOLIC PANEL - Abnormal; Notable for the following components:      Result Value   Glucose, Bld 105 (*)    Calcium 8.5 (*)    All other components within normal limits  CBC WITH DIFFERENTIAL/PLATELET  URINALYSIS, COMPLETE (UACMP) WITH MICROSCOPIC  TROPONIN I (HIGH SENSITIVITY)    RADIOLOGY Images were viewed by me CT head IMPRESSION:  1. No acute intracranial hemorrhage.  2. 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  Clinical correlation and further evaluation with MRI recommended.   These results were called by telephone at the time of interpretation  on 09/17/2018 at 12:34 pm to Dr. Lenise Arena , who verbally  acknowledged these results.   ____________________________________________   DIFFERENTIAL DIAGNOSIS   CVA, TIA, metastasis  FINAL ASSESSMENT AND PLAN  CVA   Plan: The patient had presented for  right-sided weakness. Patient's labs did not reveal any acute process. Patient's imaging did reveal a small left-sided infarct which would correlate with his symptoms.  I have started him on aspirin and will discuss with the hospitalist for admission.  Symptoms seem to start on Monday.   Laurence Aly, MD    Note: This note was generated in part or whole with voice recognition software. Voice recognition is usually quite accurate but there are transcription errors that can and very often do occur. I apologize for any typographical errors that were not detected and corrected.     Earleen Newport, MD 09/17/18 1258

## 2018-09-17 NOTE — Consult Note (Signed)
Reason for Consult: R sided weakness  Referring Physician: Dr. Margaretmary Eddy  CC: R sided weakness   HPI: Andrew Mullins is an 63 y.o. male with a known history of prostate cancer status post radical prostatectomy, esophageal cancer status post chemoradiation therapy and frequent endoscopy, GERD, hypertension has been having right-sided weakness starting from Monday.  Patient reports last well-known was on Sunday night at 11 PM.  Pt was raking and went to sleep on Sunday night,  admits that he woke up up at 5 AM on Monday when he went to the bathroom he noticed right leg and right hand weakness associated with headache.  As the right-sided weakness was not going away patient went to his primary care physician at [facility] clinic yesterday who referred him to see the neurologist Dr. Melrose Nakayama today.  CTH with subacute L suborbital BG/periventricular ischemia.    Past Medical History:  Diagnosis Date  . Cancer ([place])   . GERD (gastroesophageal reflux disease)   . Hypertension     Past Surgical History:  Procedure Laterality Date  . broken bones repair    . ESOPHAGOGASTRODUODENOSCOPY (EGD) WITH PROPOFOL N/A 02/18/2018   Procedure: ESOPHAGOGASTRODUODENOSCOPY (EGD) WITH PROPOFOL;  Surgeon: [name], MD;  Location: [facility] ENDOSCOPY;  Service: Gastroenterology;  Laterality: N/A;  . ESOPHAGOGASTRODUODENOSCOPY (EGD) WITH PROPOFOL N/A 04/01/2018   Procedure: ESOPHAGOGASTRODUODENOSCOPY (EGD) WITH PROPOFOL;  Surgeon: [name], MD;  Location: [facility] ENDOSCOPY;  Service: Gastroenterology;  Laterality: N/A;  . EYE SURGERY     retinal detatchment  . FRACTURE SURGERY    . radical prostate    . TONSILLECTOMY      No family history on file.  Social History:  reports that he has quit smoking. He has never used smokeless tobacco. No history on file for alcohol and drug.  No Known Allergies  Medications: I have reviewed the patient's current medications.  ROS: History obtained from the patient  General ROS:  negative for - chills, fatigue, fever, night sweats, weight gain or weight loss Psychological ROS: negative for - behavioral disorder, hallucinations, memory difficulties, mood swings or suicidal ideation Ophthalmic ROS: negative for - blurry vision, double vision, eye pain or loss of vision ENT ROS: negative for - epistaxis, nasal discharge, oral lesions, sore throat, tinnitus or vertigo Allergy and Immunology ROS: negative for - hives or itchy/watery eyes Hematological and Lymphatic ROS: negative for - bleeding problems, bruising or swollen lymph nodes Endocrine ROS: negative for - galactorrhea, hair pattern changes, polydipsia/polyuria or temperature intolerance Respiratory ROS: negative for - cough, hemoptysis, shortness of breath or wheezing Cardiovascular ROS: negative for - chest pain, dyspnea on exertion, edema or irregular heartbeat Gastrointestinal esophageal CA Genito-Urinary ROS: negative for - dysuria, hematuria, incontinence or urinary frequency/urgency Musculoskeletal ROS: negative for - joint swelling or muscular weakness Neurological ROS: as noted in HPI Dermatological ROS: negative for rash and skin lesion changes  Physical Examination: Blood pressure (!) 147/103, pulse (!) 54, temperature 98 F (36.7 C), temperature source Oral, resp. rate 17, height 6' (1.829 m), weight 95.3 kg, SpO2 100 %.    Neurological Examination   Mental Status: Alert, oriented, thought content appropriate.  Speech fluent without evidence of aphasia.  Able to follow 3 step commands without difficulty. Cranial Nerves: II: Discs flat bilaterally; Visual fields grossly normal, pupils equal, round, reactive to light and accommodation III,IV, VI: ptosis not present, extra-ocular motions intact bilaterally V,VII: smile symmetric, facial light touch sensation normal bilaterally VIII: hearing normal bilaterally IX,X: gag reflex present XI: bilateral  shoulder shrug XII: midline tongue  extension Motor: Right : Upper extremity   5/5 R hand 4/5   Left:     Upper extremity   5/5  Lower extremity   5/5 Distal weakness in plantar flexion    Lower extremity   5/5 Tone and bulk:normal tone throughout; no atrophy noted Sensory: Pinprick and light touch intact throughout, bilaterally Deep Tendon Reflexes: 2+ and symmetric throughout Plantars: Right: downgoing   Left: downgoing Cerebellar: normal finger-to-nose, normal rapid alternating movements and normal heel-to-shin test Gait: not tested      Laboratory Studies:   Basic Metabolic Panel: Recent Labs  Lab 09/17/18 1201  NA 138  K 3.8  CL 106  CO2 26  GLUCOSE 105*  BUN 12  CREATININE 0.72  CALCIUM 8.5*    Liver Function Tests: Recent Labs  Lab 09/17/18 1201  AST 15  ALT 10  ALKPHOS 52  BILITOT 0.4  PROT 6.5  ALBUMIN 3.5   No results for input(s): LIPASE, AMYLASE in the last 168 hours. No results for input(s): AMMONIA in the last 168 hours.  CBC: Recent Labs  Lab 09/17/18 1201  WBC 6.1  NEUTROABS 4.0  HGB 13.3  HCT 40.1  MCV 85.5  PLT 181    Cardiac Enzymes: No results for input(s): CKTOTAL, CKMB, CKMBINDEX, TROPONINI in the last 168 hours.  BNP: Invalid input(s): POCBNP  CBG: No results for input(s): GLUCAP in the last 168 hours.  Microbiology: No results found for this or any previous visit.  Coagulation Studies: No results for input(s): LABPROT, INR in the last 72 hours.  Urinalysis: No results for input(s): COLORURINE, LABSPEC, PHURINE, GLUCOSEU, HGBUR, BILIRUBINUR, KETONESUR, PROTEINUR, UROBILINOGEN, NITRITE, LEUKOCYTESUR in the last 168 hours.  Invalid input(s): APPERANCEUR  Lipid Panel:  No results found for: CHOL, TRIG, HDL, CHOLHDL, VLDL, LDLCALC  HgbA1C: No results found for: HGBA1C  Urine Drug Screen:  No results found for: LABOPIA, COCAINSCRNUR, LABBENZ, AMPHETMU, THCU, LABBARB  Alcohol Level: No results for input(s): ETH in the last 168 hours.  Other  results: EKG: normal EKG, normal sinus rhythm, unchanged from previous tracings.  Imaging: Ct Head Wo Contrast  Result Date: 09/17/2018 CLINICAL DATA:  63 year old male with focal neurologic deficit. Possible stroke. Right arm and leg weakness since Monday. EXAM: CT HEAD WITHOUT CONTRAST TECHNIQUE: Contiguous axial images were obtained from the base of the skull through the vertex without intravenous contrast. COMPARISON:  None. FINDINGS: Brain: The ventricles and sulci appropriate size for patient's age. There is a 9 mm hypodense focus in the left periventricular white matter corona radiata (series 2, image 17) consistent with an age indeterminate infarct, possibly subacute. An acute infarct is not excluded. Further evaluation with MRI is recommended. A subcentimeter hypodense focus in the right periventricular white matter is noted. There is no acute intracranial hemorrhage. No mass effect or midline shift no extra-axial fluid collection. Vascular: No hyperdense vessel or unexpected calcification. Skull: Normal. Negative for fracture or focal lesion. Sinuses/Orbits: There is a metallic structure in the right orbit medial to the globe. The visualized paranasal sinuses and mastoid air cells are clear. Other: None IMPRESSION: 1. No acute intracranial hemorrhage. 2. 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 Clinical correlation and further evaluation with MRI recommended. These results were called by telephone at the time of interpretation on 09/17/2018 at 12:34 pm to Dr. Lenise Arena , who verbally acknowledged these results. Electronically Signed   By: Anner Crete M.D.   On: 09/17/2018 12:36  Assessment/Plan:  63 y.o. male with a known history of prostate cancer status post radical prostatectomy, esophageal cancer status post chemoradiation therapy and frequent endoscopy, GERD, hypertension has been having right-sided weakness starting from Monday.  Patient reports last  well-known was on Sunday night at 11 PM.  Pt was raking and went to sleep on Sunday night,  admits that he woke up up at 5 AM on Monday when he went to the bathroom he noticed right leg and right hand weakness associated with headache.  As the right-sided weakness was not going away patient went to his primary care physician at [facility] clinic yesterday who referred him to see the neurologist Dr. Melrose Nakayama today.  CTH with subacute L suborbital BG/periventricular ischemia.    - Suspect subacute stroke in L subcortical are could be causing this as he is 4 days out.   - MRI head, MRA h/n ordered. MRA to make sure no dissection as manual labor on Sunday that could of provoked these symptoms - Will hold off spine imaging at this time as that subcortical infract likely explains it - Agree ASA 325 daily - Pt/ot 09/17/2018, 2:49 PM

## 2018-09-17 NOTE — ED Notes (Signed)
Pt reports that he was sent here from PCP office for further evaluation of possible stroke. Right arm and leg weakness since Monday. Pt able to ambulate to desk without difficulty. Speech appropriate, alert and oriented X 4.

## 2018-09-17 NOTE — H&P (Signed)
[facility] [place] NAME: Andrew Mullins    MR#:  [id number]  DATE OF BIRTH:  09-16-1955  DATE OF ADMISSION:  09/17/2018  PRIMARY CARE PHYSICIAN: Ellamae Sia, MD   REQUESTING/REFERRING PHYSICIAN: Lenise Arena  CHIEF COMPLAINT:  Right-sided weakness  HISTORY OF PRESENT ILLNESS:  Andrew Mullins  is a 63 y.o. male with a known history of prostate cancer status post radical prostatectomy, esophageal cancer status post chemoradiation therapy, GERD, hypertension has been having right-sided weakness starting from Monday.  Patient reports last well-known was on Sunday night at 11 PM.  He admits that he woke up up at 5 AM on Monday when he went to the bathroom he noticed right leg and right hand weakness associated with headache.  As the right-sided weakness was not going away patient went to his primary care physician at [facility] clinic yesterday who referred him to see the neurologist Dr. Melrose Nakayama today.  Patient was seen by Dr. Starleen Blue who sent him to the emergency department immediately.  CT head is negative for acute findings but patient still has right-sided weakness and diminished fine motor skills and having hard time in putting the words together.  Wife at bedside.  No similar numbness in the past  PAST MEDICAL HISTORY:   Past Medical History:  Diagnosis Date  . Cancer ([place])   . GERD (gastroesophageal reflux disease)   . Hypertension     PAST SURGICAL HISTOIRY:   Past Surgical History:  Procedure Laterality Date  . broken bones repair    . ESOPHAGOGASTRODUODENOSCOPY (EGD) WITH PROPOFOL N/A 02/18/2018   Procedure: ESOPHAGOGASTRODUODENOSCOPY (EGD) WITH PROPOFOL;  Surgeon: [name], MD;  Location: [facility] ENDOSCOPY;  Service: Gastroenterology;  Laterality: N/A;  . ESOPHAGOGASTRODUODENOSCOPY (EGD) WITH PROPOFOL N/A 04/01/2018   Procedure: ESOPHAGOGASTRODUODENOSCOPY (EGD) WITH PROPOFOL;  Surgeon: [name], MD;  Location: [facility]  ENDOSCOPY;  Service: Gastroenterology;  Laterality: N/A;  . EYE SURGERY     retinal detatchment  . FRACTURE SURGERY    . radical prostate    . TONSILLECTOMY      SOCIAL HISTORY:   Social History   Tobacco Use  . Smoking status: Former Research scientist (life sciences)  . Smokeless tobacco: Never Used  Substance Use Topics  . Alcohol use: Not on file    FAMILY HISTORY:  No family history on file.  DRUG ALLERGIES:  No Known Allergies  REVIEW OF SYSTEMS:  CONSTITUTIONAL: No fever, fatigue or weakness.  EYES: No blurred or double vision.  EARS, NOSE, AND THROAT: No tinnitus or ear pain.  RESPIRATORY: No cough, shortness of breath, wheezing or hemoptysis.  CARDIOVASCULAR: No chest pain, orthopnea, edema.  GASTROINTESTINAL: No nausea, vomiting, diarrhea or abdominal pain.  GENITOURINARY: No dysuria, hematuria.  ENDOCRINE: No polyuria, nocturia,  HEMATOLOGY: No anemia, easy bruising or bleeding SKIN: No rash or lesion. MUSCULOSKELETAL: No joint pain or arthritis.   NEUROLOGIC: Reports right-sided weakness associated with headache and diminished fine motor skills no tingling, numbness, weakness.  PSYCHIATRY: No anxiety or depression.   MEDICATIONS AT HOME:   Prior to Admission medications   Medication Sig Start Date End Date Taking? Authorizing Provider  famciclovir (FAMVIR) 250 MG tablet Take 250 mg by mouth 2 (two) times daily.   Yes [provider]  ibuprofen (ADVIL) 200 MG tablet Take 400 mg by mouth every 6 (six) hours as needed for mild pain.   Yes [provider]  losartan (COZAAR) 50 MG tablet Take 50 mg by mouth daily.  08/20/18  Yes [provider]  metoprolol tartrate (LOPRESSOR) 25 MG tablet Take 25 mg by mouth 2 (two) times daily.   Yes [provider]  omeprazole (PRILOSEC) 20 MG capsule Take 20 mg by mouth daily.   Yes [provider]      VITAL SIGNS:  Blood pressure (!) 142/92, pulse (!) 58, temperature 98 F (36.7 C), temperature source  Oral, resp. rate 16, height 6' (1.829 m), weight 95.3 kg, SpO2 100 %.  PHYSICAL EXAMINATION:  GENERAL:  63 y.o.-year-old patient lying in the bed with no acute distress.  EYES: Pupils equal, round, reactive to light and accommodation. No scleral icterus. Extraocular muscles intact.  HEENT: Head atraumatic, normocephalic. Oropharynx and nasopharynx clear.  NECK:  Supple, no jugular venous distention. No thyroid enlargement, no tenderness.  LUNGS: Normal breath sounds bilaterally, no wheezing, rales,rhonchi or crepitation. No use of accessory muscles of respiration.  CARDIOVASCULAR: S1, S2 normal. No murmurs, rubs, or gallops.  ABDOMEN: Soft, nontender, nondistended. Bowel sounds present. No organomegaly or mass.  EXTREMITIES: No pedal edema, cyanosis, or clubbing.  NEUROLOGIC: Cranial nerves II through XII are intact. Muscle strength 5/5 in all left-sided extremities.  Right upper and lower extremity 4 out of 5 .sensation intact. Gait not checked.  Diminished fine motor skills PSYCHIATRIC: The patient is alert and oriented x 3.  SKIN: No obvious rash, lesion, or ulcer.   LABORATORY PANEL:   CBC Recent Labs  Lab 09/17/18 1201  WBC 6.1  HGB 13.3  HCT 40.1  PLT 181   ------------------------------------------------------------------------------------------------------------------  Chemistries  Recent Labs  Lab 09/17/18 1201  NA 138  K 3.8  CL 106  CO2 26  GLUCOSE 105*  BUN 12  CREATININE 0.72  CALCIUM 8.5*  AST 15  ALT 10  ALKPHOS 52  BILITOT 0.4   ------------------------------------------------------------------------------------------------------------------  Cardiac Enzymes No results for input(s): TROPONINI in the last 168 hours. ------------------------------------------------------------------------------------------------------------------  RADIOLOGY:  Ct Head Wo Contrast  Result Date: 09/17/2018 CLINICAL DATA:  63 year old male with focal neurologic deficit.  Possible stroke. Right arm and leg weakness since Monday. EXAM: CT HEAD WITHOUT CONTRAST TECHNIQUE: Contiguous axial images were obtained from the base of the skull through the vertex without intravenous contrast. COMPARISON:  None. FINDINGS: Brain: The ventricles and sulci appropriate size for patient's age. There is a 9 mm hypodense focus in the left periventricular white matter corona radiata (series 2, image 17) consistent with an age indeterminate infarct, possibly subacute. An acute infarct is not excluded. Further evaluation with MRI is recommended. A subcentimeter hypodense focus in the right periventricular white matter is noted. There is no acute intracranial hemorrhage. No mass effect or midline shift no extra-axial fluid collection. Vascular: No hyperdense vessel or unexpected calcification. Skull: Normal. Negative for fracture or focal lesion. Sinuses/Orbits: There is a metallic structure in the right orbit medial to the globe. The visualized paranasal sinuses and mastoid air cells are clear. Other: None IMPRESSION: 1. No acute intracranial hemorrhage. 2. 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 Clinical correlation and further evaluation with MRI recommended. These results were called by telephone at the time of interpretation on 09/17/2018 at 12:34 pm to Dr. Lenise Arena , who verbally acknowledged these results. Electronically Signed   By: Anner Crete M.D.   On: 09/17/2018 12:36    EKG:   Orders placed or performed during the hospital encounter of 09/17/18  . ED EKG  . ED EKG  . EKG 12-Lead  . EKG 12-Lead  IMPRESSION AND PLAN:   # clinical CVA Admit to MedSurg unit CT head is negative for acute findings but has age-indeterminate left periventricular white matter infarct Will get MRI of the brain, carotid Dopplers and 2D echocardiogram Bedside swallow evaluation, n.p.o. in the interim Neurology consult, secure chat message sent to Dr. Irish Elders Aspirin  325 mg once daily once patient passes bedside swallow evaluation lipid panel TSH, hemoglobin A1c check PT/OT, speech therapy evaluation  #Hypertension Holding p.o. medications available as well evaluation is pending  #History of esophageal cancer status post chemo and radiation therapy Patient has residual esophageal stenosis Follow-up with gastroenterology for esophageal dilatation as recommended  #History of prostate cancer status post radical hysterectomy  #GERD PPI Provide GI and DVT prophylaxis  All the records are reviewed and case discussed with ED provider. Management plans discussed with the patient, wife at bedside and they are in agreement.  CODE STATUS: fc   TOTAL TIME TAKING CARE OF THIS PATIENT: 45 minutes.   Note: This dictation was prepared with Dragon dictation along with smaller phrase technology. Any transcriptional errors that result from this process are unintentional.  Nicholes Mango M.D on 09/17/2018 at 2:07 PM  Between 7am to 6pm - Pager - [phone]  After 6pm go to [email or web address] - password [email or web address]  Office  [phone]  CC: Primary care physician; Ellamae Sia, MD

## 2018-09-17 NOTE — ED Notes (Signed)
ED TO INPATIENT HANDOFF REPORT  ED Nurse Name and Phone #:  [phone]  S Name/Age/Gender Andrew Mullins 63 y.o. male Room/Bed: ED24A/ED24A  Code Status   Code Status: Not on file  Home/SNF/Other Home Patient oriented to: self, place, time and situation Is this baseline? Yes   Triage Complete: Triage complete  Chief Complaint poss cva  Triage Note Patient presents to ED via POV from home with c/o right sided weakness. Patient reports last known well Sunday night 2300. Patient reports at 0500 on Monday he got up and went to the bathroom and noticed his right leg dragging behind him. A&O x4. No slurred speech. 5/5 bilateral hand grasps noted. Patient reports still feeling "off".    Allergies No Known Allergies  Level of Care/Admitting Diagnosis ED Disposition    ED Disposition Condition [facility] Area: [place] [[id number]]  Level of Care: Med-Surg [16]  Covid Evaluation: Person Under Investigation (PUI)  Diagnosis: [facility]cerebral vascular [id number]  Admitting Physician: Nicholes Mango Alvarado  Attending Physician: Nicholes Mango [[id number]]  Bed request comments: 1c  PT Class (Do Not Modify): Observation [104]  PT Acc Code (Do Not Modify): Observation [10022]       B Medical/Surgery History Past Medical History:  Diagnosis Date  . Cancer ([place])   . GERD (gastroesophageal reflux disease)   . Hypertension    Past Surgical History:  Procedure Laterality Date  . broken bones repair    . ESOPHAGOGASTRODUODENOSCOPY (EGD) WITH PROPOFOL N/A 02/18/2018   Procedure: ESOPHAGOGASTRODUODENOSCOPY (EGD) WITH PROPOFOL;  Surgeon: [name], MD;  Location: [facility] ENDOSCOPY;  Service: Gastroenterology;  Laterality: N/A;  . ESOPHAGOGASTRODUODENOSCOPY (EGD) WITH PROPOFOL N/A 04/01/2018   Procedure: ESOPHAGOGASTRODUODENOSCOPY (EGD) WITH PROPOFOL;  Surgeon: [name], MD;  Location: [facility] ENDOSCOPY;  Service: Gastroenterology;  Laterality: N/A;   . EYE SURGERY     retinal detatchment  . FRACTURE SURGERY    . radical prostate    . TONSILLECTOMY       A IV Location/Drains/Wounds Patient Lines/Drains/Airways Status   Active Line/Drains/Airways    Name:   Placement date:   Placement time:   Site:   Days:   Peripheral IV 09/17/18 Right Antecubital   09/17/18    1207    Antecubital   less than 1          Intake/Output Last 24 hours No intake or output data in the 24 hours ending 09/17/18 1556  Labs/Imaging Results for orders placed or performed during the hospital encounter of 09/17/18 (from the past 48 hour(s))  CBC with Differential     Status: None   Collection Time: 09/17/18 12:01 PM  Result Value Ref Range   WBC 6.1 4.0 - 10.5 K/uL   RBC 4.69 4.22 - 5.81 MIL/uL   Hemoglobin 13.3 13.0 - 17.0 g/dL   HCT 40.1 39.0 - 52.0 %   MCV 85.5 80.0 - 100.0 fL   MCH 28.4 26.0 - 34.0 pg   MCHC 33.2 30.0 - 36.0 g/dL   RDW 12.8 11.5 - 15.5 %   Platelets 181 150 - 400 K/uL   nRBC 0.0 0.0 - 0.2 %   Neutrophils Relative % 65 %   Neutro Abs 4.0 1.7 - 7.7 K/uL   Lymphocytes Relative 21 %   Lymphs Abs 1.3 0.7 - 4.0 K/uL   Monocytes Relative 11 %   Monocytes Absolute 0.6 0.1 - 1.0 K/uL   Eosinophils Relative 2 %  Eosinophils Absolute 0.1 0.0 - 0.5 K/uL   Basophils Relative 1 %   Basophils Absolute 0.0 0.0 - 0.1 K/uL   Immature Granulocytes 0 %   Abs Immature Granulocytes 0.02 0.00 - 0.07 K/uL    Comment: Performed at [facility], [address] [place], [place] [address]  Comprehensive metabolic panel     Status: Abnormal   Collection Time: 09/17/18 12:01 PM  Result Value Ref Range   Sodium 138 135 - 145 mmol/L   Potassium 3.8 3.5 - 5.1 mmol/L   Chloride 106 98 - 111 mmol/L   CO2 26 22 - 32 mmol/L   Glucose, Bld 105 (H) 70 - 99 mg/dL   BUN 12 8 - 23 mg/dL   Creatinine, Ser 0.72 0.61 - 1.24 mg/dL   Calcium 8.5 (L) 8.9 - 10.3 mg/dL   Total Protein 6.5 6.5 - 8.1 g/dL   Albumin 3.5 3.5 - 5.0 g/dL   AST 15 15 -  41 U/L   ALT 10 0 - 44 U/L   Alkaline Phosphatase 52 38 - 126 U/L   Total Bilirubin 0.4 0.3 - 1.2 mg/dL   GFR calc non Af Amer >60 >60 mL/min   GFR calc Af Amer >60 >60 mL/min   Anion gap 6 5 - 15    Comment: Performed at [facility], [address] [place], [place] [address]  Troponin I (High Sensitivity)     Status: None   Collection Time: 09/17/18 12:01 PM  Result Value Ref Range   Troponin I (High Sensitivity) 3 <18 ng/L    Comment: (NOTE) Elevated high sensitivity troponin I (hsTnI) values and significant  changes across serial measurements may suggest ACS but many other  chronic and acute conditions are known to elevate hsTnI results.  Refer to the "Links" section for chest pain algorithms and additional  guidance. Performed at [facility], [address] [place], [place] [address]   Urinalysis, Complete w Microscopic     Status: Abnormal   Collection Time: 09/17/18  3:07 PM  Result Value Ref Range   Color, Urine COLORLESS (A) YELLOW   APPearance CLEAR (A) CLEAR   Specific Gravity, Urine 1.004 (L) 1.005 - 1.030   pH 7.0 5.0 - 8.0   Glucose, UA NEGATIVE NEGATIVE mg/dL   Hgb urine dipstick SMALL (A) NEGATIVE   Bilirubin Urine NEGATIVE NEGATIVE   Ketones, ur NEGATIVE NEGATIVE mg/dL   Protein, ur NEGATIVE NEGATIVE mg/dL   Nitrite NEGATIVE NEGATIVE   Leukocytes,Ua NEGATIVE NEGATIVE   RBC / HPF 0-5 0 - 5 RBC/hpf   WBC, UA 0-5 0 - 5 WBC/hpf   Bacteria, UA NONE SEEN NONE SEEN   Squamous Epithelial / LPF NONE SEEN 0 - 5    Comment: Performed at [facility], [address] [place], [place] [address]   Ct Head Wo Contrast  Result Date: 09/17/2018 CLINICAL DATA:  63 year old male with focal neurologic deficit. Possible stroke. Right arm and leg weakness since Monday. EXAM: CT HEAD WITHOUT CONTRAST TECHNIQUE: Contiguous axial images were obtained from the base of the skull through the vertex without intravenous contrast. COMPARISON:  None.  FINDINGS: Brain: The ventricles and sulci appropriate size for patient's age. There is a 9 mm hypodense focus in the left periventricular white matter corona radiata (series 2, image 17) consistent with an age indeterminate infarct, possibly subacute. An acute infarct is not excluded. Further evaluation with MRI is recommended. A subcentimeter hypodense focus in the right periventricular  white matter is noted. There is no acute intracranial hemorrhage. No mass effect or midline shift no extra-axial fluid collection. Vascular: No hyperdense vessel or unexpected calcification. Skull: Normal. Negative for fracture or focal lesion. Sinuses/Orbits: There is a metallic structure in the right orbit medial to the globe. The visualized paranasal sinuses and mastoid air cells are clear. Other: None IMPRESSION: 1. No acute intracranial hemorrhage. 2. 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 Clinical correlation and further evaluation with MRI recommended. These results were called by telephone at the time of interpretation on 09/17/2018 at 12:34 pm to Dr. Lenise Arena , who verbally acknowledged these results. Electronically Signed   By: Anner Crete M.D.   On: 09/17/2018 12:36    Pending Labs Unresulted Labs (From admission, onward)    Start     Ordered   09/17/18 1300  SARS CORONAVIRUS 2 (TAT 6-24 HRS) Nasopharyngeal Nasopharyngeal Swab  (Asymptomatic/Tier 2 Patients Labs)  Once,   STAT    Question Answer Comment  Is this test for diagnosis or screening Screening   Symptomatic for COVID-19 as defined by CDC No   Hospitalized for COVID-19 No   Admitted to ICU for COVID-19 No   Previously tested for COVID-19 No   Resident in a congregate (group) care setting No   Employed in healthcare setting No      09/17/18 1259   Signed and Held  HIV antibody (Routine Testing)  Once,   R     Signed and Held   Signed and Held  Lipid panel  Tomorrow morning,   R    Comments: Fasting    Signed  and Held   Signed and Held  CBC  (enoxaparin (LOVENOX)    CrCl >/= 30 ml/min)  Once,   R    Comments: Baseline for enoxaparin therapy IF NOT ALREADY DRAWN.  Notify MD if PLT < 100 K.    Signed and Held   Signed and Held  Creatinine, serum  (enoxaparin (LOVENOX)    CrCl >/= 30 ml/min)  Once,   R    Comments: Baseline for enoxaparin therapy IF NOT ALREADY DRAWN.    Signed and Held   Signed and Held  Creatinine, serum  (enoxaparin (LOVENOX)    CrCl >/= 30 ml/min)  Weekly,   R    Comments: while on enoxaparin therapy    Signed and Held          Vitals/Pain Today's Vitals   09/17/18 1147 09/17/18 1230 09/17/18 1334 09/17/18 1400  BP:  (!) 180/101 (!) 142/92 (!) 147/103  Pulse:  (!) 55 (!) 58 (!) 54  Resp:  (!) 21 16 17   Temp:      TempSrc:      SpO2:  100% 100% 100%  Weight: 95.3 kg     Height: 6' (1.829 m)     PainSc: 0-No pain       Isolation Precautions No active isolations  Medications Medications  aspirin EC tablet 325 mg (has no administration in time range)  aspirin chewable tablet 324 mg (324 mg Oral Given 09/17/18 1334)    Mobility walks Low fall risk   Focused Assessments    R Recommendations: See Admitting Provider Note  Report given to:   Additional Notes:

## 2018-09-18 ENCOUNTER — Inpatient Hospital Stay: Admit: 2018-09-18 | Payer: BC Managed Care – PPO

## 2018-09-18 ENCOUNTER — Inpatient Hospital Stay (HOSPITAL_COMMUNITY)
Admit: 2018-09-18 | Discharge: 2018-09-18 | Disposition: A | Discharge status: Home / Self Care | Payer: BC Managed Care – PPO | Attending: Internal Medicine | Admitting: Internal Medicine

## 2018-09-18 ENCOUNTER — Observation Stay: Payer: BC Managed Care – PPO

## 2018-09-18 DIAGNOSIS — Z9221 Personal history of antineoplastic chemotherapy: Secondary | ICD-10-CM | POA: Diagnosis not present

## 2018-09-18 DIAGNOSIS — K222 Esophageal obstruction: Secondary | ICD-10-CM | POA: Diagnosis present

## 2018-09-18 DIAGNOSIS — I639 Cerebral infarction, unspecified: Secondary | ICD-10-CM | POA: Diagnosis present

## 2018-09-18 DIAGNOSIS — I351 Nonrheumatic aortic (valve) insufficiency: Secondary | ICD-10-CM

## 2018-09-18 DIAGNOSIS — I10 Essential (primary) hypertension: Secondary | ICD-10-CM | POA: Diagnosis present

## 2018-09-18 DIAGNOSIS — Z9079 Acquired absence of other genital organ(s): Secondary | ICD-10-CM | POA: Diagnosis not present

## 2018-09-18 DIAGNOSIS — Z923 Personal history of irradiation: Secondary | ICD-10-CM | POA: Diagnosis not present

## 2018-09-18 DIAGNOSIS — Z87891 Personal history of nicotine dependence: Secondary | ICD-10-CM | POA: Diagnosis not present

## 2018-09-18 DIAGNOSIS — Z8546 Personal history of malignant neoplasm of prostate: Secondary | ICD-10-CM | POA: Diagnosis not present

## 2018-09-18 DIAGNOSIS — I69351 Hemiplegia and hemiparesis following cerebral infarction affecting right dominant side: Secondary | ICD-10-CM | POA: Diagnosis not present

## 2018-09-18 DIAGNOSIS — R29700 NIHSS score 0: Secondary | ICD-10-CM | POA: Diagnosis present

## 2018-09-18 DIAGNOSIS — M21371 Foot drop, right foot: Secondary | ICD-10-CM | POA: Diagnosis present

## 2018-09-18 DIAGNOSIS — Z8501 Personal history of malignant neoplasm of esophagus: Secondary | ICD-10-CM | POA: Diagnosis not present

## 2018-09-18 DIAGNOSIS — K219 Gastro-esophageal reflux disease without esophagitis: Secondary | ICD-10-CM | POA: Diagnosis present

## 2018-09-18 DIAGNOSIS — Z20828 Contact with and (suspected) exposure to other viral communicable diseases: Secondary | ICD-10-CM | POA: Diagnosis present

## 2018-09-18 DIAGNOSIS — Z79899 Other long term (current) drug therapy: Secondary | ICD-10-CM | POA: Diagnosis not present

## 2018-09-18 LAB — LIPID PANEL
Cholesterol: 206 mg/dL — ABNORMAL HIGH (ref 0–200)
HDL: 31 mg/dL — ABNORMAL LOW (ref >40)
LDL Cholesterol: 149 mg/dL — ABNORMAL HIGH (ref 0–99)
Total CHOL/HDL Ratio: 6.6 RATIO
Triglycerides: 132 mg/dL (ref <150)
VLDL: 26 mg/dL (ref 0–40)

## 2018-09-18 LAB — ECHOCARDIOGRAM COMPLETE
Height: 72 in
Weight: 3360 oz

## 2018-09-18 MED ORDER — ASPIRIN 325 MG PO TBEC: 325.0000 mg | DELAYED_RELEASE_TABLET | Freq: Every day | ORAL | 0 refills | Status: DC

## 2018-09-18 MED ORDER — PANTOPRAZOLE SODIUM 40 MG PO TBEC
40.0000 mg | DELAYED_RELEASE_TABLET | Freq: Every day | ORAL | Status: DC
  Administered 2018-09-18: 40 mg via ORAL
  Filled 2018-09-18: qty 1

## 2018-09-18 MED ORDER — ATORVASTATIN CALCIUM 20 MG PO TABS: 40.0000 mg | ORAL_TABLET | Freq: Every day | ORAL | Status: DC

## 2018-09-18 MED ORDER — ATORVASTATIN CALCIUM 40 MG PO TABS: 40.0000 mg | ORAL_TABLET | Freq: Every day | ORAL | 2 refills | Status: DC

## 2018-09-18 NOTE — Evaluation (Signed)
Occupational Therapy Evaluation Patient Details Name: Andrew Mullins MRN: [id number] DOB: 10/16/1955 Today's Date: 09/18/2018    History of Present Illness presented to ER secondary to acute onset of R-sided weakness; admitted for TIA/CVA work up.  MRI significant for L BG/corona radiata infarct.   Clinical Impression   Patient is 63 year old male s/p L BG/corona radiata infarct affecting R dominant side.  Patient sitting up in recliner and is alert and oriented; follows commands and demonstrates good effort throughout session. Denies speech/language difficulty, visual changes.  Gross motor strength 4+ to 5/5 bilat UE/LEs, no focal deficits; noted delay in coordination, speed of activation and fine motor control to R UE > R LE.  Sensation and proprioception intact.  He is mod indep for transfers and mobility.  Educated in HEP using pink theraputty with handout which was reviewed.  Also provided with red foam utensil holder to assist with self feeding, grooming and writing tasks.  Writing improved to 80% with use of red foam holder.  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    Follow Up Recommendations  Outpatient OT    Equipment Recommendations       Recommendations for Other Services       Precautions / Restrictions Precautions Precautions: None      Mobility Bed Mobility               General bed mobility comments: seated in recliner beginning/end of treatment session  Transfers Overall transfer level: Needs assistance Equipment used: Rolling walker (2 wheeled) Transfers: Sit to/from Stand Sit to Stand: Modified independent (Device/Increase time)         General transfer comment: good strength/power to bilat LEs    Balance Overall balance assessment: Needs assistance Sitting-balance support: No upper extremity  supported;Feet supported Sitting balance-Leahy Scale: Normal     Standing balance support: No upper extremity supported Standing balance-Leahy Scale: Fair   Single Leg Stance - Right Leg: 3 Single Leg Stance - Left Leg: 4   Tandem Stance - Left Leg: 1 Rhomberg - Eyes Opened: 30 Rhomberg - Eyes Closed: 30               ADL either performed or assessed with clinical judgement   ADL Overall ADL's : Needs assistance/impaired Eating/Feeding: Set up;Minimal assistance;Sitting Eating/Feeding Details (indicate cue type and reason): diffficulty holding cup in right hand and opening packages due to decreased [facility] Grooming: Wash/dry hands;Wash/dry face;Oral care;Set up;Brushing hair;Independent;Sitting   Upper Body Bathing: Independent;Set up   Lower Body Bathing: Independent;Set up   Upper Body Dressing : Independent;Set up;Sitting Upper Body Dressing Details (indicate cue type and reason): difficulty with buttons and zippers but able to complete pull over shirts indep Lower Body Dressing: Set up;Independent   Toilet Transfer: Set up;Supervision/safety   Toileting- Clothing Manipulation and Hygiene: Set up;Supervision/safety         General ADL Comments: Pt ambulated w/o AD and currently not in need of any AD per PT.  Mod indep in transfers and all mobility with good balance.  Rec outpatient OT to address fine motor skills and strength R dominant hand.  Sensation and propioception intact.  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  Also given pink theraputty with written exercises  for HEP.     Vision Baseline Vision/History: No visual deficits Patient Visual Report: No change from baseline       Perception     Praxis      Pertinent Vitals/Pain Pain Assessment: No/denies pain     Hand Dominance Right   Extremity/Trunk Assessment Upper Extremity Assessment Upper Extremity  Assessment: RUE deficits/detail RUE Deficits / Details: Full AROM in RUE but decrased fine motor control; intact sensation and intact proprioception. RUE Sensation: WNL RUE Coordination: decreased fine motor   Lower Extremity Assessment Lower Extremity Assessment: Defer to PT evaluation   Cervical / Trunk Assessment Cervical / Trunk Assessment: Normal   Communication Communication Communication: No difficulties   Cognition Arousal/Alertness: Awake/alert Behavior During Therapy: WFL for tasks assessed/performed Overall Cognitive Status: Within Functional Limits for tasks assessed                                     General Comments       Exercises     Shoulder Instructions      Home Living Family/patient expects to be discharged to:: Private residence Living Arrangements: Spouse/significant other Available Help at Discharge: Family Type of Home: House Home Access: Stairs to enter Technical brewer of Steps: 3 Entrance Stairs-Rails: Left Home Layout: One level     Bathroom Shower/Tub: Teacher, early years/pre: Standard     Home Equipment: None          Prior Functioning/Environment Level of Independence: Independent        Comments: Indep with ADLs, household and community mobilization without assist device; working full-time as Metallurgist at [employer] List: Decreased strength;Decreased activity tolerance;Impaired UE functional use      OT Treatment/Interventions: Patient/family education;Neuromuscular education;Therapeutic exercise    OT Goals(Current goals can be found in the care plan section) Acute Rehab OT Goals Patient Stated Goal: to return home and back to work OT Goal Formulation: With patient Time For Goal Achievement: 09/25/18 Potential to Achieve Goals: Good ADL Goals Pt/caregiver will Perform Home Exercise Program: Right Upper extremity;Increased strength;With theraputty;With written HEP  provided  OT Frequency: Min 1X/week   Barriers to D/C:            Co-evaluation              AM-PAC OT "6 Clicks" Daily Activity     Outcome Measure Help from another person eating meals?: A Little Help from another person taking care of personal grooming?: None Help from another person toileting, which includes using toliet, bedpan, or urinal?: None Help from another person bathing (including washing, rinsing, drying)?: A Little Help from another person to put on and taking off regular upper body clothing?: A Little Help from another person to put on and taking off regular lower body clothing?: A Little 6 Click Score: 20   End of Session Equipment Utilized During Treatment: Other (comment)(pink theraputty and foam utensil holders given)  Activity Tolerance: Patient tolerated treatment well Patient left: in chair;with call bell/phone within reach  OT Visit Diagnosis: Muscle weakness (generalized) (M62.81);Other symptoms and signs involving the nervous system (R29.898)                Time: 1320-1400 OT Time Calculation (min): 40 min Charges:  OT General Charges $OT Visit: 1 Visit OT Evaluation $OT Eval Low Complexity: 1 Low OT Treatments $Self Care/Home  Management : 8-22 mins $Neuromuscular Re-education: 8-22 mins  Chrys Racer, OTR/L, [place] ascom [phone] 09/18/18, 2:17 PM

## 2018-09-18 NOTE — Discharge Summary (Signed)
[facility] [place] [facility] [place] NAME: Andrew Mullins    MR#:  [id number]  DATE OF BIRTH:  06-03-1955  DATE OF ADMISSION:  09/17/2018 ADMITTING PHYSICIAN: Nicholes Mango, MD  DATE OF DISCHARGE: 09/18/2018  PRIMARY CARE PHYSICIAN: Ellamae Sia, MD    ADMISSION DIAGNOSIS:  Cerebrovascular accident (CVA), unspecified mechanism ([place]) [I63.9]  DISCHARGE DIAGNOSIS:   Acute Left CVA-- new  SECONDARY DIAGNOSIS:   Past Medical History:  Diagnosis Date  . Cancer ([place])   . GERD (gastroesophageal reflux disease)   . Hypertension     HOSPITAL COURSE:   Andrew Mullins  is a 63 y.o. male with a known history of prostate cancer status post radical prostatectomy, esophageal cancer status post chemoradiation therapy, GERD, hypertension has been having right-sided weakness starting from Monday.    # acute left CVA -CT head is negative for acute findings but has age-indeterminate left periventricular white matter infarct - MRI of the brain left corona radius infarct MRI-MRA head and neck negative, -patient seen by Dr. Irish Elders -ASA 325 mg daily, added Lipitor 40 mg daily -patient will get outpatient physical therapy and occupational therapy. -He has mild right side of foot drop. Patient will follow-up with Dr. Melrose Nakayama neurology is outpatient. He can discuss clearance to drive after seeing Dr. Melrose Nakayama and getting outpatient PT  #Hypertension -resume meds with holding parameter given to patient   #History of esophageal cancer status post chemo and radiation therapy Patient has residual esophageal stenosis Follow-up with gastroenterology for esophageal dilatation as recommended-- after a few weeks  #History of prostate cancer status post radical hysterectomy  #GERD PPI  Overall hemodynamically stable. Patient was discharged to home with outpatient PT and OT CONSULTS OBTAINED:  Treatment Team:  Leotis Pain, MD  DRUG ALLERGIES:  No  Known Allergies  DISCHARGE MEDICATIONS:   Allergies as of 09/18/2018   No Known Allergies     Medication List    STOP taking these medications   ibuprofen 200 MG tablet Commonly known as: ADVIL     TAKE these medications   aspirin 325 MG EC tablet Take 1 tablet (325 mg total) by mouth daily. Start taking on: September 19, 2018   atorvastatin 40 MG tablet Commonly known as: LIPITOR Take 1 tablet (40 mg total) by mouth daily at 6 PM.   famciclovir 250 MG tablet Commonly known as: FAMVIR Take 250 mg by mouth 2 (two) times daily.   losartan 50 MG tablet Commonly known as: COZAAR Take 50 mg by mouth daily. Notes to patient: Hold of systolic blood pressure less than 120   metoprolol tartrate 25 MG tablet Commonly known as: LOPRESSOR Take 25 mg by mouth 2 (two) times daily. Notes to patient: Hold if systolic blood pressure less than 120   omeprazole 20 MG capsule Commonly known as: PRILOSEC Take 20 mg by mouth daily.       If you experience worsening of your admission symptoms, develop shortness of breath, life threatening emergency, suicidal or homicidal thoughts you must seek medical attention immediately by calling 911 or calling your MD immediately  if symptoms less severe.  You Must read complete instructions/literature along with all the possible adverse reactions/side effects for all the Medicines you take and that have been prescribed to you. Take any new Medicines after you have completely understood and accept all the possible adverse reactions/side effects.   Please note  You were cared for by a hospitalist during your hospital stay. If  you have any questions about your discharge medications or the care you received while you were in the hospital after you are discharged, you can call the unit and asked to speak with the hospitalist on call if the hospitalist that took care of you is not available. Once you are discharged, your primary care physician will handle  any further medical issues. Please note that NO REFILLS for any discharge medications will be authorized once you are discharged, as it is imperative that you return to your primary care physician (or establish a relationship with a primary care physician if you do not have one) for your aftercare needs so that they can reassess your need for medications and monitor your lab values. Today   SUBJECTIVE   Right-sided weakness much improved. Patient able to write better than yesterday.  VITAL SIGNS:  Blood pressure (!) 179/93, pulse 69, temperature 98.9 F (37.2 C), temperature source Oral, resp. rate 16, height 6' (1.829 m), weight 95.3 kg, SpO2 99 %.  I/O:    Intake/Output Summary (Last 24 hours) at 09/18/2018 1308 Last data filed at 09/18/2018 0900 Gross per 24 hour  Intake 480 ml  Output -  Net 480 ml    PHYSICAL EXAMINATION:  GENERAL:  63 y.o.-year-old patient lying in the bed with no acute distress.  EYES: Pupils equal, round, reactive to light and accommodation. No scleral icterus. Extraocular muscles intact.  HEENT: Head atraumatic, normocephalic. Oropharynx and nasopharynx clear.  NECK:  Supple, no jugular venous distention. No thyroid enlargement, no tenderness.  LUNGS: Normal breath sounds bilaterally, no wheezing, rales,rhonchi or crepitation. No use of accessory muscles of respiration.  CARDIOVASCULAR: S1, S2 normal. No murmurs, rubs, or gallops.  ABDOMEN: Soft, non-tender, non-distended. Bowel sounds present. No organomegaly or mass.  EXTREMITIES: No pedal edema, cyanosis, or clubbing.  NEUROLOGIC: Cranial nerves II through XII are intact. Muscle strength 5/5 in all extremities. Sensation intact. Gait not checked.  PSYCHIATRIC: The patient is alert and oriented x 3.  SKIN: No obvious rash, lesion, or ulcer.   DATA REVIEW:   CBC  Recent Labs  Lab 09/17/18 1201  WBC 6.1  HGB 13.3  HCT 40.1  PLT 181    Chemistries  Recent Labs  Lab 09/17/18 1201  NA 138  K 3.8   CL 106  CO2 26  GLUCOSE 105*  BUN 12  CREATININE 0.72  CALCIUM 8.5*  AST 15  ALT 10  ALKPHOS 52  BILITOT 0.4    Microbiology Results   Recent Results (from the past 240 hour(s))  SARS CORONAVIRUS 2 (TAT 6-24 HRS) Nasopharyngeal Nasopharyngeal Swab     Status: None   Collection Time: 09/17/18  1:33 PM   Specimen: Nasopharyngeal Swab  Result Value Ref Range Status   SARS Coronavirus 2 NEGATIVE NEGATIVE Final    Comment: (NOTE) SARS-CoV-2 target nucleic acids are NOT DETECTED. The SARS-CoV-2 RNA is generally detectable in upper and lower respiratory specimens during the acute phase of infection. Negative results do not preclude SARS-CoV-2 infection, do not rule out co-infections with other pathogens, and should not be used as the sole basis for treatment or other patient management decisions. Negative results must be combined with clinical observations, patient history, and epidemiological information. The expected result is Negative. Fact Sheet for Patients: SugarRoll.be Fact Sheet for Healthcare Providers: https://www.woods-mathews.com/ This test is not yet approved or cleared by the [place] FDA and  has been authorized for detection and/or diagnosis of SARS-CoV-2 by FDA under an Emergency Use  Authorization (EUA). This EUA will remain  in effect (meaning this test can be used) for the duration of the COVID-19 declaration under Section 56 4(b)(1) of the Act, 21 U.S.C. section 360bbb-3(b)(1), unless the authorization is terminated or revoked sooner. Performed at [facility], [place] [address] [place], [place] [address]     RADIOLOGY:  Ct Head Wo Contrast  Result Date: 09/17/2018 CLINICAL DATA:  63 year old male with focal neurologic deficit. Possible stroke. Right arm and leg weakness since Monday. EXAM: CT HEAD WITHOUT CONTRAST TECHNIQUE: Contiguous axial images were obtained from the base of the skull through the  vertex without intravenous contrast. COMPARISON:  None. FINDINGS: Brain: The ventricles and sulci appropriate size for patient's age. There is a 9 mm hypodense focus in the left periventricular white matter corona radiata (series 2, image 17) consistent with an age indeterminate infarct, possibly subacute. An acute infarct is not excluded. Further evaluation with MRI is recommended. A subcentimeter hypodense focus in the right periventricular white matter is noted. There is no acute intracranial hemorrhage. No mass effect or midline shift no extra-axial fluid collection. Vascular: No hyperdense vessel or unexpected calcification. Skull: Normal. Negative for fracture or focal lesion. Sinuses/Orbits: There is a metallic structure in the right orbit medial to the globe. The visualized paranasal sinuses and mastoid air cells are clear. Other: None IMPRESSION: 1. No acute intracranial hemorrhage. 2. 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 Clinical correlation and further evaluation with MRI recommended. These results were called by telephone at the time of interpretation on 09/17/2018 at 12:34 pm to Dr. Lenise Arena , who verbally acknowledged these results. Electronically Signed   By: Anner Crete M.D.   On: 09/17/2018 12:36   Mr Angio Head Wo Contrast  Result Date: 09/17/2018 CLINICAL DATA:  Stroke EXAM: MRA HEAD WITHOUT CONTRAST TECHNIQUE: Angiographic images of the Circle of Willis were obtained using MRA technique without intravenous contrast. COMPARISON:  MRI brain today FINDINGS: Both vertebral arteries widely patent. PICA patent bilaterally. Prominent right AICA. Superior cerebellar and posterior cerebral arteries patent bilaterally without stenosis. Basilar normal. Internal carotid artery widely patent bilaterally. No aneurysm. Anterior and middle cerebral arteries normal bilaterally. IMPRESSION: Normal MRA circle-of-Willis. Electronically Signed   By: Franchot Gallo M.D.   On:  09/17/2018 19:52   Mr Angio Neck Wo Contrast  Result Date: 09/17/2018 CLINICAL DATA:  Right-sided weakness.  Stroke. EXAM: MRA NECK WITHOUT CONTRAST TECHNIQUE: . Angiographic images of the neck were obtained using MRA technique without intravenous contrast. Carotid stenosis measurements (when applicable) are obtained utilizing NASCET criteria, using the distal internal carotid diameter as the denominator. COMPARISON:  None. FINDINGS: Antegrade flow in the carotid and vertebral arteries bilaterally. Carotid bifurcation widely patent bilaterally. Both vertebral arteries widely patent. IMPRESSION: Negative MRA neck Electronically Signed   By: Franchot Gallo M.D.   On: 09/17/2018 19:43   Mr Brain Wo Contrast  Result Date: 09/17/2018 CLINICAL DATA:  Right-sided weakness. History prostate cancer and esophageal cancer EXAM: MRI HEAD WITHOUT CONTRAST TECHNIQUE: Multiplanar, multiecho pulse sequences of the brain and surrounding structures were obtained without intravenous contrast. COMPARISON:  CT head 09/17/2018 FINDINGS: Brain: 1 cm acute infarct in the left corona radiata extending into the deep white matter tracts on the left. No other acute infarct. Scattered small white matter hyperintensities consistent with chronic ischemia. Negative for hemorrhage or mass. Ventricle size normal. Vascular: Normal arterial flow voids Skull and upper cervical spine: Negative Sinuses/Orbits: Mild mucosal edema paranasal sinuses. Right eye surgery. Other:  None IMPRESSION: Acute infarct in the left corona radiata and adjacent white matter tracts Mild chronic microvascular ischemic change in the white matter. Electronically Signed   By: Franchot Gallo M.D.   On: 09/17/2018 19:41   US Carotid Bilateral (at [facility] Only)  Result Date: 09/18/2018 CLINICAL DATA:  CVA. Syncopal episode. History of hypertension and hyperlipidemia. EXAM: BILATERAL CAROTID DUPLEX ULTRASOUND TECHNIQUE: Pearline Cables scale imaging, color Doppler and duplex  ultrasound were performed of bilateral carotid and vertebral arteries in the neck. COMPARISON:  None. FINDINGS: Criteria: Quantification of carotid stenosis is based on velocity parameters that correlate the residual internal carotid diameter with NASCET-based stenosis levels, using the diameter of the distal internal carotid lumen as the denominator for stenosis measurement. The following velocity measurements were obtained: RIGHT ICA: 86/31 cm/sec CCA: 123456 cm/sec SYSTOLIC ICA/CCA RATIO:  0.9 ECA: 135 cm/sec LEFT ICA: 110/34 cm/sec CCA: 0000000 cm/sec SYSTOLIC ICA/CCA RATIO:  1.0 ECA: 143 cm/sec RIGHT CAROTID ARTERY: There is a minimal amount of eccentric echogenic plaque within the right carotid bulb (image 16), extending to involve the origin and proximal aspects of the right internal carotid artery (image 24), not resulting in elevated peak systolic velocities within the interrogated course the right internal carotid artery to suggest a hemodynamically significant stenosis. RIGHT VERTEBRAL ARTERY:  Antegrade flow LEFT CAROTID ARTERY: There is no grayscale evidence of significant intimal thickening or atherosclerotic plaque affecting the interrogated portions of the left carotid system. There are no elevated peak systolic velocities within the interrogated course of the left internal carotid artery to suggest a hemodynamically significant stenosis. LEFT VERTEBRAL ARTERY:  Antegrade flow IMPRESSION: 1. Minimal amount of right-sided atherosclerotic plaque, not resulting in a hemodynamically significant stenosis. 2. Normal sonographic evaluation of the left carotid system. Electronically Signed   By: Sandi Mariscal M.D.   On: 09/18/2018 09:56     CODE STATUS:     Code Status Orders  (From admission, onward)         Start     Ordered   09/17/18 1636  Full code  Continuous     09/17/18 1635        Code Status History    This patient has a current code status but no historical code status.   Advance  Care Planning Activity      TOTAL TIME TAKING CARE OF THIS PATIENT: 40 minutes.    Fritzi Mandes M.D on 09/18/2018 at 1:08 PM  Between 7am to 6pm - Pager - [phone] After 6pm go to [email or web address] - password [email or web address] [facility]  Office  [phone]  CC: Primary care physician; Ellamae Sia, MD

## 2018-09-18 NOTE — Progress Notes (Signed)
*  PRELIMINARY RESULTS* Echocardiogram 2D Echocardiogram has been performed.  Andrew Mullins 09/18/2018, 1:08 PM

## 2018-09-18 NOTE — Progress Notes (Signed)
Discharge instructions given and went over with patient at bedside. All questions answered. Patient discharged home. Maejor Erven S, RN  

## 2018-09-18 NOTE — TOC Progression Note (Signed)
Transition of Care [facility]) - Progression Note    Patient Details  Name: Andrew Mullins MRN: [id number] Date of Birth: July 26, 1955  Transition of [facility]) CM/SW Contact  Shelbie Hutching, RN Phone Number: 09/18/2018, 12:46 PM  Clinical Narrative:    Referral sent for outpatient PT and OT to [facility] rehab.        Expected Discharge Plan and Services                                                 Social Determinants of Health (SDOH) Interventions    Readmission Risk Interventions No flowsheet data found.

## 2018-09-18 NOTE — Consult Note (Signed)
Subjective: States feeling better.     Past Medical History:  Diagnosis Date  . Cancer ([place])   . GERD (gastroesophageal reflux disease)   . Hypertension     Past Surgical History:  Procedure Laterality Date  . broken bones repair    . ESOPHAGOGASTRODUODENOSCOPY (EGD) WITH PROPOFOL N/A 02/18/2018   Procedure: ESOPHAGOGASTRODUODENOSCOPY (EGD) WITH PROPOFOL;  Surgeon: [name], MD;  Location: [facility] ENDOSCOPY;  Service: Gastroenterology;  Laterality: N/A;  . ESOPHAGOGASTRODUODENOSCOPY (EGD) WITH PROPOFOL N/A 04/01/2018   Procedure: ESOPHAGOGASTRODUODENOSCOPY (EGD) WITH PROPOFOL;  Surgeon: [name], MD;  Location: [facility] ENDOSCOPY;  Service: Gastroenterology;  Laterality: N/A;  . EYE SURGERY     retinal detatchment  . FRACTURE SURGERY    . radical prostate    . TONSILLECTOMY      History reviewed. No pertinent family history.  Social History:  reports that he has quit smoking. He has never used smokeless tobacco. He reports that he does not drink alcohol or use drugs.  No Known Allergies  Medications: I have reviewed the patient's current medications.  ROS: History obtained from the patient  General ROS: negative for - chills, fatigue, fever, night sweats, weight gain or weight loss Psychological ROS: negative for - behavioral disorder, hallucinations, memory difficulties, mood swings or suicidal ideation Ophthalmic ROS: negative for - blurry vision, double vision, eye pain or loss of vision ENT ROS: negative for - epistaxis, nasal discharge, oral lesions, sore throat, tinnitus or vertigo Allergy and Immunology ROS: negative for - hives or itchy/watery eyes Hematological and Lymphatic ROS: negative for - bleeding problems, bruising or swollen lymph nodes Endocrine ROS: negative for - galactorrhea, hair pattern changes, polydipsia/polyuria or temperature intolerance Respiratory ROS: negative for - cough, hemoptysis, shortness of breath or wheezing Cardiovascular ROS: negative  for - chest pain, dyspnea on exertion, edema or irregular heartbeat Gastrointestinal esophageal CA Genito-Urinary ROS: negative for - dysuria, hematuria, incontinence or urinary frequency/urgency Musculoskeletal ROS: negative for - joint swelling or muscular weakness Neurological ROS: as noted in HPI Dermatological ROS: negative for rash and skin lesion changes  Physical Examination: Blood pressure (!) 179/93, pulse 69, temperature 98.9 F (37.2 C), temperature source Oral, resp. rate 16, height 6' (1.829 m), weight 95.3 kg, SpO2 99 %.    Neurological Examination   Mental Status: Alert, oriented, thought content appropriate.  Speech fluent without evidence of aphasia.  Able to follow 3 step commands without difficulty. Cranial Nerves: II: Discs flat bilaterally; Visual fields grossly normal, pupils equal, round, reactive to light and accommodation III,IV, VI: ptosis not present, extra-ocular motions intact bilaterally V,VII: smile symmetric, facial light touch sensation normal bilaterally VIII: hearing normal bilaterally IX,X: gag reflex present XI: bilateral shoulder shrug XII: midline tongue extension Motor: Right : Upper extremity   5/5 R hand 4/5   Left:     Upper extremity   5/5  Lower extremity   5/5 Distal weakness in plantar flexion    Lower extremity   5/5 Tone and bulk:normal tone throughout; no atrophy noted Sensory: Pinprick and light touch intact throughout, bilaterally Deep Tendon Reflexes: 2+ and symmetric throughout Plantars: Right: downgoing   Left: downgoing Cerebellar: normal finger-to-nose, normal rapid alternating movements and normal heel-to-shin test Gait: not tested      Laboratory Studies:   Basic Metabolic Panel: Recent Labs  Lab 09/17/18 1201  NA 138  K 3.8  CL 106  CO2 26  GLUCOSE 105*  BUN 12  CREATININE 0.72  CALCIUM 8.5*    Liver Function  Tests: Recent Labs  Lab 09/17/18 1201  AST 15  ALT 10  ALKPHOS 52  BILITOT 0.4  PROT  6.5  ALBUMIN 3.5   No results for input(s): LIPASE, AMYLASE in the last 168 hours. No results for input(s): AMMONIA in the last 168 hours.  CBC: Recent Labs  Lab 09/17/18 1201  WBC 6.1  NEUTROABS 4.0  HGB 13.3  HCT 40.1  MCV 85.5  PLT 181    Cardiac Enzymes: No results for input(s): CKTOTAL, CKMB, CKMBINDEX, TROPONINI in the last 168 hours.  BNP: Invalid input(s): POCBNP  CBG: No results for input(s): GLUCAP in the last 168 hours.  Microbiology: Results for orders placed or performed during the hospital encounter of 09/17/18  SARS CORONAVIRUS 2 (TAT 6-24 HRS) Nasopharyngeal Nasopharyngeal Swab     Status: None   Collection Time: 09/17/18  1:33 PM   Specimen: Nasopharyngeal Swab  Result Value Ref Range Status   SARS Coronavirus 2 NEGATIVE NEGATIVE Final    Comment: (NOTE) SARS-CoV-2 target nucleic acids are NOT DETECTED. The SARS-CoV-2 RNA is generally detectable in upper and lower respiratory specimens during the acute phase of infection. Negative results do not preclude SARS-CoV-2 infection, do not rule out co-infections with other pathogens, and should not be used as the sole basis for treatment or other patient management decisions. Negative results must be combined with clinical observations, patient history, and epidemiological information. The expected result is Negative. Fact Sheet for Patients: SugarRoll.be Fact Sheet for Healthcare Providers: https://www.woods-mathews.com/ This test is not yet approved or cleared by the [place] FDA and  has been authorized for detection and/or diagnosis of SARS-CoV-2 by FDA under an Emergency Use Authorization (EUA). This EUA will remain  in effect (meaning this test can be used) for the duration of the COVID-19 declaration under Section 56 4(b)(1) of the Act, 21 U.S.C. section 360bbb-3(b)(1), unless the authorization is terminated or revoked sooner. Performed at [facility], [place] [address], [place], [place] [address]     Coagulation Studies: No results for input(s): LABPROT, INR in the last 72 hours.  Urinalysis:  Recent Labs  Lab 09/17/18 1507  COLORURINE COLORLESS*  LABSPEC 1.004*  PHURINE 7.0  GLUCOSEU NEGATIVE  HGBUR SMALL*  BILIRUBINUR NEGATIVE  KETONESUR NEGATIVE  PROTEINUR NEGATIVE  NITRITE NEGATIVE  LEUKOCYTESUR NEGATIVE    Lipid Panel:     Component Value Date/Time   CHOL 206 (H) 09/18/2018 0511   TRIG 132 09/18/2018 0511   HDL 31 (L) 09/18/2018 0511   CHOLHDL 6.6 09/18/2018 0511   VLDL 26 09/18/2018 0511   LDLCALC 149 (H) 09/18/2018 0511    HgbA1C: No results found for: HGBA1C  Urine Drug Screen:  No results found for: LABOPIA, COCAINSCRNUR, LABBENZ, AMPHETMU, THCU, LABBARB  Alcohol Level: No results for input(s): ETH in the last 168 hours.  Other results: EKG: normal EKG, normal sinus rhythm, unchanged from previous tracings.  Imaging: Ct Head Wo Contrast  Result Date: 09/17/2018 CLINICAL DATA:  63 year old male with focal neurologic deficit. Possible stroke. Right arm and leg weakness since Monday. EXAM: CT HEAD WITHOUT CONTRAST TECHNIQUE: Contiguous axial images were obtained from the base of the skull through the vertex without intravenous contrast. COMPARISON:  None. FINDINGS: Brain: The ventricles and sulci appropriate size for patient's age. There is a 9 mm hypodense focus in the left periventricular white matter corona radiata (series 2, image 17) consistent with an age indeterminate infarct, possibly subacute. An acute infarct is not excluded. Further evaluation with  MRI is recommended. A subcentimeter hypodense focus in the right periventricular white matter is noted. There is no acute intracranial hemorrhage. No mass effect or midline shift no extra-axial fluid collection. Vascular: No hyperdense vessel or unexpected calcification. Skull: Normal. Negative for fracture or focal lesion. Sinuses/Orbits: There is a  metallic structure in the right orbit medial to the globe. The visualized paranasal sinuses and mastoid air cells are clear. Other: None IMPRESSION: 1. No acute intracranial hemorrhage. 2. 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 Clinical correlation and further evaluation with MRI recommended. These results were called by telephone at the time of interpretation on 09/17/2018 at 12:34 pm to Dr. Lenise Arena , who verbally acknowledged these results. Electronically Signed   By: Anner Crete M.D.   On: 09/17/2018 12:36   Mr Angio Head Wo Contrast  Result Date: 09/17/2018 CLINICAL DATA:  Stroke EXAM: MRA HEAD WITHOUT CONTRAST TECHNIQUE: Angiographic images of the Circle of Willis were obtained using MRA technique without intravenous contrast. COMPARISON:  MRI brain today FINDINGS: Both vertebral arteries widely patent. PICA patent bilaterally. Prominent right AICA. Superior cerebellar and posterior cerebral arteries patent bilaterally without stenosis. Basilar normal. Internal carotid artery widely patent bilaterally. No aneurysm. Anterior and middle cerebral arteries normal bilaterally. IMPRESSION: Normal MRA circle-of-Willis. Electronically Signed   By: Franchot Gallo M.D.   On: 09/17/2018 19:52   Mr Angio Neck Wo Contrast  Result Date: 09/17/2018 CLINICAL DATA:  Right-sided weakness.  Stroke. EXAM: MRA NECK WITHOUT CONTRAST TECHNIQUE: . Angiographic images of the neck were obtained using MRA technique without intravenous contrast. Carotid stenosis measurements (when applicable) are obtained utilizing NASCET criteria, using the distal internal carotid diameter as the denominator. COMPARISON:  None. FINDINGS: Antegrade flow in the carotid and vertebral arteries bilaterally. Carotid bifurcation widely patent bilaterally. Both vertebral arteries widely patent. IMPRESSION: Negative MRA neck Electronically Signed   By: Franchot Gallo M.D.   On: 09/17/2018 19:43   Mr Brain Wo  Contrast  Result Date: 09/17/2018 CLINICAL DATA:  Right-sided weakness. History prostate cancer and esophageal cancer EXAM: MRI HEAD WITHOUT CONTRAST TECHNIQUE: Multiplanar, multiecho pulse sequences of the brain and surrounding structures were obtained without intravenous contrast. COMPARISON:  CT head 09/17/2018 FINDINGS: Brain: 1 cm acute infarct in the left corona radiata extending into the deep white matter tracts on the left. No other acute infarct. Scattered small white matter hyperintensities consistent with chronic ischemia. Negative for hemorrhage or mass. Ventricle size normal. Vascular: Normal arterial flow voids Skull and upper cervical spine: Negative Sinuses/Orbits: Mild mucosal edema paranasal sinuses. Right eye surgery. Other: None IMPRESSION: Acute infarct in the left corona radiata and adjacent white matter tracts Mild chronic microvascular ischemic change in the white matter. Electronically Signed   By: Franchot Gallo M.D.   On: 09/17/2018 19:41   US Carotid Bilateral (at [facility] Only)  Result Date: 09/18/2018 CLINICAL DATA:  CVA. Syncopal episode. History of hypertension and hyperlipidemia. EXAM: BILATERAL CAROTID DUPLEX ULTRASOUND TECHNIQUE: Pearline Cables scale imaging, color Doppler and duplex ultrasound were performed of bilateral carotid and vertebral arteries in the neck. COMPARISON:  None. FINDINGS: Criteria: Quantification of carotid stenosis is based on velocity parameters that correlate the residual internal carotid diameter with NASCET-based stenosis levels, using the diameter of the distal internal carotid lumen as the denominator for stenosis measurement. The following velocity measurements were obtained: RIGHT ICA: 86/31 cm/sec CCA: 123456 cm/sec SYSTOLIC ICA/CCA RATIO:  0.9 ECA: 135 cm/sec LEFT ICA: 110/34 cm/sec CCA: 0000000 cm/sec SYSTOLIC ICA/CCA RATIO:  1.0 ECA: 143 cm/sec RIGHT CAROTID ARTERY: There is a minimal amount of eccentric echogenic plaque within the right carotid bulb  (image 16), extending to involve the origin and proximal aspects of the right internal carotid artery (image 24), not resulting in elevated peak systolic velocities within the interrogated course the right internal carotid artery to suggest a hemodynamically significant stenosis. RIGHT VERTEBRAL ARTERY:  Antegrade flow LEFT CAROTID ARTERY: There is no grayscale evidence of significant intimal thickening or atherosclerotic plaque affecting the interrogated portions of the left carotid system. There are no elevated peak systolic velocities within the interrogated course of the left internal carotid artery to suggest a hemodynamically significant stenosis. LEFT VERTEBRAL ARTERY:  Antegrade flow IMPRESSION: 1. Minimal amount of right-sided atherosclerotic plaque, not resulting in a hemodynamically significant stenosis. 2. Normal sonographic evaluation of the left carotid system. Electronically Signed   By: Sandi Mariscal M.D.   On: 09/18/2018 09:56     Assessment/Plan:  63 y.o. male with a known history of prostate cancer status post radical prostatectomy, esophageal cancer status post chemoradiation therapy and frequent endoscopy, GERD, hypertension has been having right-sided weakness starting from Monday.  Patient reports last well-known was on Sunday night at 11 PM.  Pt was raking and went to sleep on Sunday night,  admits that he woke up up at 5 AM on Monday when he went to the bathroom he noticed right leg and right hand weakness associated with headache.  As the right-sided weakness was not going away patient went to his primary care physician at [facility] clinic yesterday who referred him to see the neurologist Dr. Melrose Nakayama today.  CTH with subacute L suborbital BG/periventricular ischemia.   - MRI ischemia same area as the subacute stroke on CTH - MRA h/n no signs of dissection - pt/ot  - ASA daily   - Pt can drive as long as he is cleared by physical therapy with that minimal foot drop. - Ok to have  endoscopy in the next few weeks as scheduled - d/c planning depending on PT/OT - d/w pt and wife.   09/18/2018, 11:40 AM

## 2018-09-18 NOTE — Evaluation (Signed)
Physical Therapy Evaluation Patient Details Name: Andrew Mullins MRN: [id number] DOB: 05-Jun-1955 Today's Date: 09/18/2018   History of Present Illness  presented to ER secondary to acute onset of R-sided weakness; admitted for TIA/CVA work up.  MRI significant for L BG/corona radiata infarct.  Clinical Impression  Upon evaluation, patient alert and oriented; follows commands and demonstrates good effort with all mobility tasks. Denies speech/language difficulty, visual changes.  Gross motor strength 4+ to 5/5 bilat UE/LEs, no focal deficits; noted delay in coordination, speed of activation and fine motor control to R UE > R LE.  Denies numbness/paresthesia.  Able to complete sit/stand, basic transfers and gait (400') without assist device, mod indep. Demonstrates reciprocal stepping pattern with good step height/length; mild decreased in R heel strike.  Good cadence and gait speed; min cuing for relaxation and swing to R UE.  Completes dynamic gait components (head turns, changes of direction, speed modulation and start/stop) without difficulty or LOB.  Higher level balance difficulties (tandem stance, SLS) with noted difficulty, but patient with fair use of compensatory strategies at this time. Would benefit from skilled PT to address above deficits and promote optimal return to PLOF; recommend transition to home with outpatient PT upon discharge.     Follow Up Recommendations Outpatient PT(Outpatient OT)    Equipment Recommendations       Recommendations for Other Services       Precautions / Restrictions Precautions Precautions: None      Mobility  Bed Mobility               General bed mobility comments: seated in recliner beginning/end of treatment session  Transfers Overall transfer level: Needs assistance Equipment used: Rolling walker (2 wheeled) Transfers: Sit to/from Stand Sit to Stand: Modified independent (Device/Increase time)         General transfer  comment: good strength/power to bilat LEs  Ambulation/Gait Ambulation/Gait assistance: Modified independent (Device/Increase time) Gait Distance (Feet): 400 Feet Assistive device: None       General Gait Details: reciprocal stepping pattern with good step height/length; mild decreased in R heel strike.  Good cadence and gait speed; min cuing for relaxation and swing to R UE.  Completes dynamic gait components (head turns, changes of direction, speed modulation and start/stop) without difficulty or LOB.  Stairs            Wheelchair Mobility    Modified Rankin (Stroke Patients Only)       Balance Overall balance assessment: Needs assistance Sitting-balance support: No upper extremity supported;Feet supported Sitting balance-Leahy Scale: Normal     Standing balance support: No upper extremity supported Standing balance-Leahy Scale: Fair   Single Leg Stance - Right Leg: 3 Single Leg Stance - Left Leg: 4   Tandem Stance - Left Leg: 1 Rhomberg - Eyes Opened: 30 Rhomberg - Eyes Closed: 30                 Pertinent Vitals/Pain Pain Assessment: No/denies pain    Home Living Family/patient expects to be discharged to:: Private residence Living Arrangements: Spouse/significant other Available Help at Discharge: Family Type of Home: House Home Access: Stairs to enter Entrance Stairs-Rails: Left Entrance Stairs-Number of Steps: 3 Home Layout: One level Home Equipment: None      Prior Function Level of Independence: Independent         Comments: Indep with ADLs, household and community mobilization without assist device; working full-time as Metallurgist at [employer]  Dominant Hand: Right    Extremity/Trunk Assessment   Upper Extremity Assessment Upper Extremity Assessment: (gross motor strength 4+ to 5/5 bilat, no focal deficits; noted delay in coordination, speed of activation and fine motor control to R UE.  Denies  numbness/paresthesia)    Lower Extremity Assessment Lower Extremity Assessment: (gross strength 4+ to 5/5 bilat; very mild R foot drop (worsens with fatigue), but compensates well)       Communication   Communication: No difficulties  Cognition Arousal/Alertness: Awake/alert Behavior During Therapy: WFL for tasks assessed/performed Overall Cognitive Status: Within Functional Limits for tasks assessed                                        General Comments      Exercises     Assessment/Plan    PT Assessment Patient needs continued PT services  PT Problem List Decreased activity tolerance;Decreased balance;Decreased coordination;Decreased mobility;Decreased strength       PT Treatment Interventions DME instruction;Gait training;Stair training;Therapeutic activities;Functional mobility training;Therapeutic exercise;Balance training;Neuromuscular re-education;Patient/family education    PT Goals (Current goals can be found in the Care Plan section)  Acute Rehab PT Goals Patient Stated Goal: to return home and back to work PT Goal Formulation: With patient/family Time For Goal Achievement: 10/02/18 Potential to Achieve Goals: Good    Frequency 7X/week   Barriers to discharge        Co-evaluation               AM-PAC PT "6 Clicks" Mobility  Outcome Measure Help needed turning from your back to your side while in a flat bed without using bedrails?: None Help needed moving from lying on your back to sitting on the side of a flat bed without using bedrails?: None Help needed moving to and from a bed to a chair (including a wheelchair)?: None Help needed standing up from a chair using your arms (e.g., wheelchair or bedside chair)?: None Help needed to walk in hospital room?: None Help needed climbing 3-5 steps with a railing? : A Little 6 Click Score: 23    End of Session Equipment Utilized During Treatment: Gait belt Activity Tolerance: Patient  tolerated treatment well Patient left: in chair;with family/visitor present;with call bell/phone within reach Nurse Communication: Mobility status PT Visit Diagnosis: Hemiplegia and hemiparesis;Difficulty in walking, not elsewhere classified (R26.2) Hemiplegia - Right/Left: Right Hemiplegia - dominant/non-dominant: Dominant Hemiplegia - caused by: Cerebral infarction    Time: 1147-1208 PT Time Calculation (min) (ACUTE ONLY): 21 min   Charges:   PT Evaluation $PT Eval Moderate Complexity: 1 Mod PT Treatments $Gait Training: 8-22 mins        Pasha Gadison H. Owens Shark, PT, DPT, NCS 09/18/18, 1:36 PM [phone]

## 2018-09-18 NOTE — Progress Notes (Signed)
Chart reviewed and Pt visited. Pt is being discharged home today. Pts speech is clear and appropriate. HX of esophageal cancer and has F/u with EGD as OP. Pt reports no swallowing issues during this admission.Plans for OP PT and OT for weakness secondary to new CVA. NO ST indicated at this time.

## 2018-09-19 LAB — HIV ANTIBODY (ROUTINE TESTING W REFLEX): HIV Screen 4th Generation wRfx: NONREACTIVE

## 2018-09-29 ENCOUNTER — Ambulatory Visit: Payer: BC Managed Care – PPO

## 2018-10-10 DIAGNOSIS — Z8673 Personal history of transient ischemic attack (TIA), and cerebral infarction without residual deficits: Secondary | ICD-10-CM | POA: Insufficient documentation

## 2018-10-10 HISTORY — DX: Personal history of transient ischemic attack (TIA), and cerebral infarction without residual deficits: Z86.73

## 2018-11-17 ENCOUNTER — Telehealth: Payer: Self-pay

## 2018-11-17 NOTE — Telephone Encounter (Signed)
Patient is calling because patient is wanting to scheduled another EGD. Patient last EGD was 04/01/2018

## 2018-11-18 ENCOUNTER — Other Ambulatory Visit: Payer: Self-pay

## 2018-11-18 DIAGNOSIS — R1310 Dysphagia, unspecified: Secondary | ICD-10-CM

## 2018-11-18 NOTE — Telephone Encounter (Signed)
Called pt regarding his request to schedule a repeat upper endoscopy.  Unable to contact, LVM to return call

## 2018-11-18 NOTE — Telephone Encounter (Signed)
Please go ahead and schedule for dilation.  Check if on any blood thinners.

## 2018-11-18 NOTE — Telephone Encounter (Signed)
Spoke with pt regarding the repeat EGD. Pt states he's began to have trouble swallowing again and would like another esophageal dilation. I explained that I will need to run this by Dr. Vicente Males to make sure it's okay to schedule.

## 2018-11-18 NOTE — Telephone Encounter (Signed)
Pt procedure has been scheduled on 12-02-18, will send blood thinner holding request for the Aspirin 325 mg to pt Neurologist. Pt is aware and agrees.

## 2018-11-25 ENCOUNTER — Telehealth: Payer: Self-pay

## 2018-11-25 NOTE — Telephone Encounter (Signed)
Called pt neurologist office to request clarification of blood thinner holding instructions for pt's Aspirin 325 mg. Spoke with Dr. Lannie Fields nurse who informed me that Dr. Melrose Nakayama states pt is cleared to have the endoscopy procedure and is okay to hold the aspirin 325 mg for as long as Dr. Vicente Males requires prior to procedure.  Called to inform pt, unable to contact, left detailed VM Will also send MyChart message.

## 2018-11-28 ENCOUNTER — Other Ambulatory Visit: Payer: Self-pay

## 2018-11-28 ENCOUNTER — Other Ambulatory Visit
Admission: RE | Admit: 2018-11-28 | Discharge: 2018-11-28 | Disposition: A | Discharge status: Home / Self Care | Payer: BC Managed Care – PPO | Source: Ambulatory Visit | Attending: Gastroenterology | Admitting: Gastroenterology

## 2018-11-28 DIAGNOSIS — Z20828 Contact with and (suspected) exposure to other viral communicable diseases: Secondary | ICD-10-CM | POA: Diagnosis not present

## 2018-11-28 DIAGNOSIS — K219 Gastro-esophageal reflux disease without esophagitis: Secondary | ICD-10-CM | POA: Insufficient documentation

## 2018-11-28 DIAGNOSIS — Z01812 Encounter for preprocedural laboratory examination: Secondary | ICD-10-CM | POA: Diagnosis present

## 2018-11-28 DIAGNOSIS — E785 Hyperlipidemia, unspecified: Secondary | ICD-10-CM | POA: Insufficient documentation

## 2018-11-28 LAB — SARS CORONAVIRUS 2 (TAT 6-24 HRS): SARS Coronavirus 2: NEGATIVE

## 2018-12-02 ENCOUNTER — Encounter: Payer: Self-pay | Admitting: *Deleted

## 2018-12-02 ENCOUNTER — Ambulatory Visit: Payer: BC Managed Care – PPO | Admitting: Registered Nurse

## 2018-12-02 ENCOUNTER — Ambulatory Visit
Admission: RE | Admit: 2018-12-02 | Discharge: 2018-12-02 | Disposition: A | Discharge status: Home / Self Care | Payer: BC Managed Care – PPO | Attending: Gastroenterology | Admitting: Gastroenterology

## 2018-12-02 ENCOUNTER — Other Ambulatory Visit: Payer: Self-pay

## 2018-12-02 ENCOUNTER — Encounter
Admission: RE | Disposition: A | Discharge status: Home / Self Care | Payer: Self-pay | Source: Home / Self Care | Attending: Gastroenterology

## 2018-12-02 DIAGNOSIS — I10 Essential (primary) hypertension: Secondary | ICD-10-CM | POA: Diagnosis not present

## 2018-12-02 DIAGNOSIS — K219 Gastro-esophageal reflux disease without esophagitis: Secondary | ICD-10-CM | POA: Insufficient documentation

## 2018-12-02 DIAGNOSIS — Z8501 Personal history of malignant neoplasm of esophagus: Secondary | ICD-10-CM | POA: Insufficient documentation

## 2018-12-02 DIAGNOSIS — K222 Esophageal obstruction: Secondary | ICD-10-CM | POA: Insufficient documentation

## 2018-12-02 DIAGNOSIS — Z7982 Long term (current) use of aspirin: Secondary | ICD-10-CM | POA: Diagnosis not present

## 2018-12-02 DIAGNOSIS — R1310 Dysphagia, unspecified: Secondary | ICD-10-CM | POA: Diagnosis present

## 2018-12-02 DIAGNOSIS — K228 Other specified diseases of esophagus: Secondary | ICD-10-CM | POA: Insufficient documentation

## 2018-12-02 DIAGNOSIS — Z87891 Personal history of nicotine dependence: Secondary | ICD-10-CM | POA: Insufficient documentation

## 2018-12-02 DIAGNOSIS — Z79899 Other long term (current) drug therapy: Secondary | ICD-10-CM | POA: Diagnosis not present

## 2018-12-02 HISTORY — PX: ESOPHAGOGASTRODUODENOSCOPY (EGD) WITH PROPOFOL: SHX5813

## 2018-12-02 SURGERY — ESOPHAGOGASTRODUODENOSCOPY (EGD) WITH PROPOFOL
Anesthesia: General

## 2018-12-02 MED ORDER — PROPOFOL 500 MG/50ML IV EMUL
INTRAVENOUS | Status: DC | PRN
  Administered 2018-12-02: 140 ug/kg/min via INTRAVENOUS

## 2018-12-02 MED ORDER — SODIUM CHLORIDE 0.9 % IV SOLN
INTRAVENOUS | Status: DC
  Administered 2018-12-02: 09:00:00 via INTRAVENOUS

## 2018-12-02 MED ORDER — PROPOFOL 10 MG/ML IV BOLUS
INTRAVENOUS | Status: DC | PRN
  Administered 2018-12-02: 80 mg via INTRAVENOUS

## 2018-12-02 NOTE — Anesthesia Postprocedure Evaluation (Signed)
Anesthesia Post Note  Patient: Andrew Mullins  Procedure(s) Performed: ESOPHAGOGASTRODUODENOSCOPY (EGD) WITH PROPOFOL with Dilation (N/A )  Patient location during evaluation: Endoscopy Anesthesia Type: General Level of consciousness: awake and alert Pain management: pain level controlled Vital Signs Assessment: post-procedure vital signs reviewed and stable Respiratory status: spontaneous breathing, nonlabored ventilation, respiratory function stable and patient connected to nasal cannula oxygen Cardiovascular status: blood pressure returned to baseline and stable Postop Assessment: no apparent nausea or vomiting Anesthetic complications: no     Last Vitals:  Vitals:   12/02/18 0940 12/02/18 1008  BP: 139/88 (!) 152/98  Pulse: 84   Resp: (!) 21   Temp: (!) 36.2 C   SpO2: 99%     Last Pain:  Vitals:   12/02/18 0940  TempSrc:   PainSc: 0-No pain                 Precious Haws Piscitello

## 2018-12-02 NOTE — Anesthesia Preprocedure Evaluation (Signed)
Anesthesia Evaluation  Patient identified by MRN, date of birth, ID band Patient awake    Reviewed: Allergy & Precautions, H&P , NPO status , Patient's Chart, lab work & pertinent test results  History of Anesthesia Complications Negative for: history of anesthetic complications  Airway Mallampati: III  TM Distance: <3 FB Neck ROM: limited    Dental  (+) Chipped   Pulmonary neg pulmonary ROS, neg shortness of breath, former smoker,           Cardiovascular Exercise Tolerance: Good hypertension, (-) angina(-) Past MI and (-) DOE      Neuro/Psych CVA negative psych ROS   GI/Hepatic Neg liver ROS, GERD  Medicated and Controlled,  Endo/Other  negative endocrine ROS  Renal/GU negative Renal ROS  negative genitourinary   Musculoskeletal   Abdominal   Peds  Hematology negative hematology ROS (+)   Anesthesia Other Findings Past Medical History: No date: Cancer ([place])     Comment:  esophageal No date: GERD (gastroesophageal reflux disease) No date: Hypertension  Past Surgical History: No date: broken bones repair 02/18/2018: ESOPHAGOGASTRODUODENOSCOPY (EGD) WITH PROPOFOL; N/A     Comment:  Procedure: ESOPHAGOGASTRODUODENOSCOPY (EGD) WITH               PROPOFOL;  Surgeon: [name], MD;  Location: [facility]               ENDOSCOPY;  Service: Gastroenterology;  Laterality: N/A; 04/01/2018: ESOPHAGOGASTRODUODENOSCOPY (EGD) WITH PROPOFOL; N/A     Comment:  Procedure: ESOPHAGOGASTRODUODENOSCOPY (EGD) WITH               PROPOFOL;  Surgeon: [name], MD;  Location: [facility]               ENDOSCOPY;  Service: Gastroenterology;  Laterality: N/A; No date: EYE SURGERY     Comment:  retinal detatchment No date: FRACTURE SURGERY No date: radical prostate No date: TONSILLECTOMY  BMI    Body Mass Index: 29.16 kg/m      Reproductive/Obstetrics negative OB ROS                             Anesthesia  Physical Anesthesia Plan  ASA: III  Anesthesia Plan: General   Post-op Pain Management:    Induction: Intravenous  PONV Risk Score and Plan: Propofol infusion and TIVA  Airway Management Planned: Natural Airway and Nasal Cannula  Additional Equipment:   Intra-op Plan:   Post-operative Plan:   Informed Consent: I have reviewed the patients History and Physical, chart, labs and discussed the procedure including the risks, benefits and alternatives for the proposed anesthesia with the patient or authorized representative who has indicated his/her understanding and acceptance.     Dental Advisory Given  Plan Discussed with: Anesthesiologist, CRNA and Surgeon  Anesthesia Plan Comments: (Patient has medical clearance for the procedure.  Patient consented for risk of stroke or re stroke.  He voiced understanding.  Patient consented for risks of anesthesia including but not limited to:  - adverse reactions to medications - risk of intubation if required - damage to teeth, lips or other oral mucosa - sore throat or hoarseness - Damage to heart, brain, lungs or loss of life  Patient voiced understanding.)        Anesthesia Quick Evaluation

## 2018-12-02 NOTE — Op Note (Signed)
[facility] Gastroenterology Patient Name: Andrew Mullins Procedure Date: 12/02/2018 9:20 AM MRN: [id number] Account #: [id number] Date of Birth: [birth year] Admit Type: Outpatient Age: 63 Room: [facility] ENDO ROOM 1 Gender: Male Note Status: Finalized Procedure:             Upper GI endoscopy Indications:           Dysphagia Providers:             [name] MD, MD Medicines:             Monitored Anesthesia Care Complications:         No immediate complications. Procedure:             Pre-Anesthesia Assessment:                        - Prior to the procedure, a History and Physical was                         performed, and patient medications, allergies and                         sensitivities were reviewed. The patient's tolerance                         of previous anesthesia was reviewed.                        - The risks and benefits of the procedure and the                         sedation options and risks were discussed with the                         patient. All questions were answered and informed                         consent was obtained.                        - ASA Grade Assessment: II - A patient with mild                         systemic disease.                        After obtaining informed consent, the endoscope was                         passed under direct vision. Throughout the procedure,                         the patient's blood pressure, pulse, and oxygen                         saturations were monitored continuously. The Endoscope                         was introduced through the mouth, and advanced to the  third part of duodenum. The upper GI endoscopy was                         accomplished with ease. The patient tolerated the                         procedure well. Findings:      The examined duodenum was normal.      The stomach was normal.      The cardia and gastric fundus were normal on  retroflexion.      One benign-appearing, intrinsic severe (stenosis; an endoscope cannot       pass) stenosis was found 30 cm from the incisors. This stenosis measured       8 mm (inner diameter) x 2 cm (in length). The stenosis was traversed       after dilation. A TTS dilator was passed through the scope. Dilation       with an 08-23-08 mm balloon and a 10-26-10 mm balloon dilator was       performed to 11 mm. The dilation site was examined following endoscope       reinsertion and showed moderate mucosal disruption.      A single 7 mm mucosal nodule with a localized distribution was found in       the upper third of the esophagus, 20 cm from the incisors.      The exam was otherwise without abnormality. Impression:            - Normal examined duodenum.                        - Normal stomach.                        - Benign-appearing esophageal stenosis. Dilated.                        - Mucosal nodule found in the esophagus.                        - The examination was otherwise normal.                        - No specimens collected. Recommendation:        - Discharge patient to home (with escort).                        - Resume previous diet.                        - Continue present medications.                        - 1. Refer to Dr Rush Landmark or Dr Ardis Hughs for EUS and                         EMR of esophageal nodule in view of prior history of                         esophageal cancer in remission.  2. He will require further dilation from 11 mm to                         possibly 15 mm at next session                        3. F/u in my office in 6 weeks Procedure Code(s):     --- Professional ---                        [id number], Esophagogastroduodenoscopy, flexible,                         transoral; with transendoscopic balloon dilation of                         esophagus (less than 30 mm diameter) Diagnosis Code(s):     --- Professional ---                         K22.2, Esophageal obstruction                        K22.8, Other specified diseases of esophagus                        R13.10, Dysphagia, unspecified CPT copyright 2019 American Medical Association. All rights reserved. The codes documented in this report are preliminary and upon coder review may  be revised to meet current compliance requirements. [name], MD [name] MD, MD 12/02/2018 9:38:56 AM This report has been signed electronically. Number of Addenda: 0 Note Initiated On: 12/02/2018 9:20 AM Estimated Blood Loss:  Estimated blood loss: none.      [facility]

## 2018-12-02 NOTE — Anesthesia Post-op Follow-up Note (Signed)
Anesthesia QCDR form completed.        

## 2018-12-02 NOTE — H&P (Signed)
[name], MD [address], [place], [place], [place], [address] [id number] [place], [place], [place], [address] Phone: [phone]  Fax: [phone]  Primary Care Physician:  Baxter Hire, MD   Pre-Procedure History & Physical: HPI:  Andrew Mullins is a 63 y.o. male is here for an endoscopy    Past Medical History:  Diagnosis Date  . Cancer ([place])    esophageal  . GERD (gastroesophageal reflux disease)   . Hypertension     Past Surgical History:  Procedure Laterality Date  . broken bones repair    . ESOPHAGOGASTRODUODENOSCOPY (EGD) WITH PROPOFOL N/A 02/18/2018   Procedure: ESOPHAGOGASTRODUODENOSCOPY (EGD) WITH PROPOFOL;  Surgeon: [name], MD;  Location: [facility] ENDOSCOPY;  Service: Gastroenterology;  Laterality: N/A;  . ESOPHAGOGASTRODUODENOSCOPY (EGD) WITH PROPOFOL N/A 04/01/2018   Procedure: ESOPHAGOGASTRODUODENOSCOPY (EGD) WITH PROPOFOL;  Surgeon: [name], MD;  Location: [facility] ENDOSCOPY;  Service: Gastroenterology;  Laterality: N/A;  . EYE SURGERY     retinal detatchment  . FRACTURE SURGERY    . radical prostate    . TONSILLECTOMY      Prior to Admission medications   Medication Sig Start Date End Date Taking? Authorizing Provider  aspirin EC 325 MG EC tablet Take 1 tablet (325 mg total) by mouth daily. 09/19/18  Yes Fritzi Mandes, MD  atorvastatin (LIPITOR) 40 MG tablet Take 1 tablet (40 mg total) by mouth daily at 6 PM. 09/18/18  Yes Fritzi Mandes, MD  famciclovir (FAMVIR) 250 MG tablet Take 250 mg by mouth 2 (two) times daily.   Yes [provider]  losartan (COZAAR) 50 MG tablet Take 50 mg by mouth daily. 08/20/18  Yes [provider]  metoprolol tartrate (LOPRESSOR) 25 MG tablet Take 25 mg by mouth 2 (two) times daily.   Yes [provider]  omeprazole (PRILOSEC) 20 MG capsule Take 20 mg by mouth daily.   Yes [provider]    Allergies as of 11/19/2018  . (No Known Allergies)    History reviewed. No  pertinent family history.  Social History   Socioeconomic History  . Marital status: Married    Spouse name: Not on file  . Number of children: Not on file  . Years of education: Not on file  . Highest education level: Not on file  Occupational History  . Not on file  Social Needs  . Financial resource strain: Not on file  . Food insecurity    Worry: Not on file    Inability: Not on file  . Transportation needs    Medical: Not on file    Non-medical: Not on file  Tobacco Use  . Smoking status: Former Research scientist (life sciences)  . Smokeless tobacco: Never Used  Substance and Sexual Activity  . Alcohol use: Never    Frequency: Never  . Drug use: Never  . Sexual activity: Not on file  Lifestyle  . Physical activity    Days per week: Not on file    Minutes per session: Not on file  . Stress: Not on file  Relationships  . Social Herbalist on phone: Not on file    Gets together: Not on file    Attends religious service: Not on file    Active member of club or organization: Not on file    Attends meetings of clubs or organizations: Not on file    Relationship status: Not on file  . Intimate partner violence    Fear of  current or ex partner: Not on file    Emotionally abused: Not on file    Physically abused: Not on file    Forced sexual activity: Not on file  Other Topics Concern  . Not on file  Social History Narrative   Lives at home with wife    Review of Systems: See HPI, otherwise negative ROS  Physical Exam: BP (!) 149/95   Pulse 63   Temp (!) 97.4 F (36.3 C) (Tympanic)   Resp 18   Ht 6' (1.829 m)   Wt 97.5 kg   SpO2 100%   BMI 29.16 kg/m  General:   Alert,  pleasant and cooperative in NAD Head:  Normocephalic and atraumatic. Neck:  Supple; no masses or thyromegaly. Lungs:  Clear throughout to auscultation, normal respiratory effort.    Heart:  +S1, +S2, Regular rate and rhythm, No edema. Abdomen:  Soft, nontender and nondistended. Normal bowel sounds,  without guarding, and without rebound.   Neurologic:  Alert and  oriented x4;  grossly normal neurologically.  Impression/Plan: Andrew Mullins is here for an endoscopy  to be performed for  evaluation of dysphagia    Risks, benefits, limitations, and alternatives regarding endoscopy and dilation have been reviewed with the patient.  Questions have been answered.  All parties agreeable.   [name], MD  12/02/2018, 9:20 AM

## 2018-12-02 NOTE — Transfer of Care (Signed)
Immediate Anesthesia Transfer of Care Note  Patient: Andrew Mullins  Procedure(s) Performed: ESOPHAGOGASTRODUODENOSCOPY (EGD) WITH PROPOFOL with Dilation (N/A )  Patient Location: PACU  Anesthesia Type:General  Level of Consciousness: sedated  Airway & Oxygen Therapy: Patient Spontanous Breathing  Post-op Assessment: Report given to RN and Post -op Vital signs reviewed and stable  Post vital signs: Reviewed and stable  Last Vitals:  Vitals Value Taken Time  BP 139/88 12/02/18 0940  Temp 36.2 C 12/02/18 0940  Pulse 84 12/02/18 0940  Resp 15 12/02/18 0940  SpO2 100 % 12/02/18 0940  Vitals shown include unvalidated device data.  Last Pain:  Vitals:   12/02/18 0940  TempSrc:   PainSc: 0-No pain         Complications: No apparent anesthesia complications

## 2018-12-03 ENCOUNTER — Encounter: Payer: Self-pay | Admitting: Gastroenterology

## 2018-12-04 ENCOUNTER — Telehealth: Payer: Self-pay

## 2018-12-04 NOTE — Telephone Encounter (Signed)
Called pt to offer a follow up televisit with Dr. Vicente Males following his 12/19/18 procedure with [facility].  Unable to contact, LVM to return call

## 2018-12-29 ENCOUNTER — Ambulatory Visit: Payer: BC Managed Care – PPO | Admitting: Gastroenterology

## 2018-12-29 ENCOUNTER — Telehealth: Payer: Self-pay | Admitting: Gastroenterology

## 2018-12-29 NOTE — Telephone Encounter (Signed)
Patient called l/m on v/m stating he was returning a call to office.

## 2019-01-05 ENCOUNTER — Telehealth: Payer: Self-pay | Admitting: Gastroenterology

## 2019-01-05 NOTE — Telephone Encounter (Signed)
Pt  Left vm returning Dr. Georgeann Oppenheim call he states he is doing fine he has a follow up apt with [facility] on 01/26/18 and they will arrange a Biopsy. He states he sorry he did not return our call sooner he has been traveling.

## 2019-02-09 ENCOUNTER — Encounter: Payer: Self-pay | Admitting: Internal Medicine

## 2019-05-06 ENCOUNTER — Telehealth: Payer: Self-pay | Admitting: Gastroenterology

## 2019-05-06 ENCOUNTER — Other Ambulatory Visit: Payer: Self-pay

## 2019-05-06 DIAGNOSIS — R1310 Dysphagia, unspecified: Secondary | ICD-10-CM

## 2019-05-06 NOTE — Telephone Encounter (Signed)
Spoke with pt and was able to schedule pt's repeat upper endoscopy with dilation.

## 2019-05-06 NOTE — Telephone Encounter (Signed)
Patient called & l/m on v/m stating he had not heard back from his prior message. Please call & ASAP to schedule an Procedure.

## 2019-05-14 ENCOUNTER — Other Ambulatory Visit
Admission: RE | Admit: 2019-05-14 | Discharge: 2019-05-14 | Disposition: A | Discharge status: Home / Self Care | Payer: BC Managed Care – PPO | Source: Ambulatory Visit | Attending: Gastroenterology | Admitting: Gastroenterology

## 2019-05-14 DIAGNOSIS — Z20822 Contact with and (suspected) exposure to covid-19: Secondary | ICD-10-CM | POA: Insufficient documentation

## 2019-05-14 DIAGNOSIS — Z01812 Encounter for preprocedural laboratory examination: Secondary | ICD-10-CM | POA: Insufficient documentation

## 2019-05-14 LAB — SARS CORONAVIRUS 2 (TAT 6-24 HRS): SARS Coronavirus 2: NEGATIVE

## 2019-05-15 ENCOUNTER — Encounter: Payer: Self-pay | Admitting: Gastroenterology

## 2019-05-18 ENCOUNTER — Ambulatory Visit: Payer: BC Managed Care – PPO | Admitting: Anesthesiology

## 2019-05-18 ENCOUNTER — Other Ambulatory Visit: Payer: Self-pay

## 2019-05-18 ENCOUNTER — Encounter: Payer: Self-pay | Admitting: Gastroenterology

## 2019-05-18 ENCOUNTER — Encounter
Admission: RE | Disposition: A | Discharge status: Home / Self Care | Payer: Self-pay | Source: Home / Self Care | Attending: Gastroenterology

## 2019-05-18 ENCOUNTER — Ambulatory Visit
Admission: RE | Admit: 2019-05-18 | Discharge: 2019-05-18 | Disposition: A | Discharge status: Home / Self Care | Payer: BC Managed Care – PPO | Attending: Gastroenterology | Admitting: Gastroenterology

## 2019-05-18 DIAGNOSIS — Z87891 Personal history of nicotine dependence: Secondary | ICD-10-CM | POA: Diagnosis not present

## 2019-05-18 DIAGNOSIS — K222 Esophageal obstruction: Secondary | ICD-10-CM | POA: Diagnosis not present

## 2019-05-18 DIAGNOSIS — Z7982 Long term (current) use of aspirin: Secondary | ICD-10-CM | POA: Insufficient documentation

## 2019-05-18 DIAGNOSIS — Z8673 Personal history of transient ischemic attack (TIA), and cerebral infarction without residual deficits: Secondary | ICD-10-CM | POA: Insufficient documentation

## 2019-05-18 DIAGNOSIS — Z8501 Personal history of malignant neoplasm of esophagus: Secondary | ICD-10-CM | POA: Diagnosis not present

## 2019-05-18 DIAGNOSIS — R1310 Dysphagia, unspecified: Secondary | ICD-10-CM | POA: Diagnosis present

## 2019-05-18 DIAGNOSIS — Z79899 Other long term (current) drug therapy: Secondary | ICD-10-CM | POA: Insufficient documentation

## 2019-05-18 DIAGNOSIS — K219 Gastro-esophageal reflux disease without esophagitis: Secondary | ICD-10-CM | POA: Insufficient documentation

## 2019-05-18 DIAGNOSIS — I10 Essential (primary) hypertension: Secondary | ICD-10-CM | POA: Insufficient documentation

## 2019-05-18 HISTORY — PX: ESOPHAGOGASTRODUODENOSCOPY (EGD) WITH PROPOFOL: SHX5813

## 2019-05-18 SURGERY — ESOPHAGOGASTRODUODENOSCOPY (EGD) WITH PROPOFOL
Anesthesia: General

## 2019-05-18 MED ORDER — LIDOCAINE HCL (PF) 2 % IJ SOLN
INTRAMUSCULAR | Status: AC
  Filled 2019-05-18: qty 5

## 2019-05-18 MED ORDER — LIDOCAINE HCL (CARDIAC) PF 100 MG/5ML IV SOSY
PREFILLED_SYRINGE | INTRAVENOUS | Status: DC | PRN
  Administered 2019-05-18: 50 mg via INTRAVENOUS

## 2019-05-18 MED ORDER — PROPOFOL 10 MG/ML IV BOLUS
INTRAVENOUS | Status: DC | PRN
  Administered 2019-05-18 (×2): 40 mg via INTRAVENOUS
  Administered 2019-05-18: 100 mg via INTRAVENOUS

## 2019-05-18 MED ORDER — SODIUM CHLORIDE 0.9 % IV SOLN: INTRAVENOUS | Status: DC

## 2019-05-18 MED ORDER — PROPOFOL 500 MG/50ML IV EMUL
INTRAVENOUS | Status: AC
  Filled 2019-05-18: qty 50

## 2019-05-18 NOTE — Transfer of Care (Signed)
Immediate Anesthesia Transfer of Care Note  Patient: Andrew Mullins  Procedure(s) Performed: ESOPHAGOGASTRODUODENOSCOPY (EGD) WITH PROPOFOL with Dilation (N/A )  Patient Location: Endoscopy Unit  Anesthesia Type:General  Level of Consciousness: drowsy and patient cooperative  Airway & Oxygen Therapy: Patient Spontanous Breathing  Post-op Assessment: Report given to RN and Post -op Vital signs reviewed and stable  Post vital signs: Reviewed and stable  Last Vitals:  Vitals Value Taken Time  BP 135/94 05/18/19 0826  Temp 36.3 C 05/18/19 0825  Pulse 68 05/18/19 0826  Resp 19 05/18/19 0826  SpO2 98 % 05/18/19 0826  Vitals shown include unvalidated device data.  Last Pain:  Vitals:   05/18/19 0825  TempSrc: Temporal         Complications: No apparent anesthesia complications

## 2019-05-18 NOTE — Anesthesia Preprocedure Evaluation (Signed)
Anesthesia Evaluation  Patient identified by MRN, date of birth, ID band Patient awake    Reviewed: Allergy & Precautions, NPO status , Patient's Chart, lab work & pertinent test results  History of Anesthesia Complications Negative for: history of anesthetic complications  Airway Mallampati: II  TM Distance: >3 FB Neck ROM: Full    Dental no notable dental hx.    Pulmonary neg sleep apnea, neg COPD, former smoker,    breath sounds clear to auscultation- rhonchi (-) wheezing      Cardiovascular Exercise Tolerance: Good hypertension, Pt. on medications (-) CAD, (-) Past MI, (-) Cardiac Stents and (-) CABG  Rhythm:Regular Rate:Normal - Systolic murmurs and - Diastolic murmurs    Neuro/Psych neg Seizures CVA, No Residual Symptoms negative psych ROS   GI/Hepatic Neg liver ROS, GERD  ,  Endo/Other  negative endocrine ROSneg diabetes  Renal/GU negative Renal ROS     Musculoskeletal negative musculoskeletal ROS (+)   Abdominal (+) - obese,   Peds  Hematology negative hematology ROS (+)   Anesthesia Other Findings Past Medical History: No date: Cancer ([place])     Comment:  esophageal No date: GERD (gastroesophageal reflux disease) No date: Hypertension 2020: Stroke ([place])     Comment:  no wealness   Reproductive/Obstetrics                             Anesthesia Physical Anesthesia Plan  ASA: II  Anesthesia Plan: General   Post-op Pain Management:    Induction: Intravenous  PONV Risk Score and Plan: 1 and Propofol infusion  Airway Management Planned: Natural Airway  Additional Equipment:   Intra-op Plan:   Post-operative Plan:   Informed Consent: I have reviewed the patients History and Physical, chart, labs and discussed the procedure including the risks, benefits and alternatives for the proposed anesthesia with the patient or authorized representative who has indicated his/her  understanding and acceptance.     Dental advisory given  Plan Discussed with: CRNA and Anesthesiologist  Anesthesia Plan Comments:         Anesthesia Quick Evaluation

## 2019-05-18 NOTE — Anesthesia Postprocedure Evaluation (Signed)
Anesthesia Post Note  Patient: KHALIEF DIEGEL  Procedure(s) Performed: ESOPHAGOGASTRODUODENOSCOPY (EGD) WITH PROPOFOL with Dilation (N/A )  Patient location during evaluation: Endoscopy Anesthesia Type: General Level of consciousness: awake and alert and oriented Pain management: pain level controlled Vital Signs Assessment: post-procedure vital signs reviewed and stable Respiratory status: spontaneous breathing, nonlabored ventilation and respiratory function stable Cardiovascular status: blood pressure returned to baseline and stable Postop Assessment: no signs of nausea or vomiting Anesthetic complications: no     Last Vitals:  Vitals:   05/18/19 0845 05/18/19 0855  BP: (!) 155/94 (!) 149/92  Pulse:    Resp:    Temp:    SpO2:      Last Pain:  Vitals:   05/18/19 0855  TempSrc:   PainSc: 0-No pain                 Edelin Fryer

## 2019-05-18 NOTE — Op Note (Signed)
[facility] Gastroenterology Patient Name: Andrew Mullins Procedure Date: 05/18/2019 7:32 AM MRN: [id number] Account #: [id number] Date of Birth: May 08, 1955 Admit Type: Inpatient Age: 64 Room: [facility] ENDO ROOM 4 Gender: Male Note Status: Finalized Procedure:             Upper GI endoscopy Indications:           Dysphagia Providers:             [name] MD, MD Referring MD:          Andres Labrum, MD (Referring MD) Medicines:             Monitored Anesthesia Care Complications:         No immediate complications. Procedure:             Pre-Anesthesia Assessment:                        - Prior to the procedure, a History and Physical was                         performed, and patient medications, allergies and                         sensitivities were reviewed. The patient's tolerance                         of previous anesthesia was reviewed.                        - The risks and benefits of the procedure and the                         sedation options and risks were discussed with the                         patient. All questions were answered and informed                         consent was obtained.                        - ASA Grade Assessment: II - A patient with mild                         systemic disease.                        After obtaining informed consent, the endoscope was                         passed under direct vision. Throughout the procedure,                         the patient's blood pressure, pulse, and oxygen                         saturations were monitored continuously. The Endoscope                         was introduced through the mouth, and advanced  to the                         upper third of esophagus. The upper GI endoscopy was                         accomplished without difficulty. The patient tolerated                         the procedure well. Findings:      One benign-appearing, intrinsic severe (stenosis; an  endoscope cannot       pass) stenosis was found 30 cm from the incisors. The stenosis was not       traversed. Impression:            - Benign-appearing esophageal stenosis.                        - No specimens collected. Recommendation:        - Discharge patient to home (with escort).                        - Clear liquid diet today.                        - Repeat upper endoscopy tomorrow for retreatment.                        - Will be done under fluyroscopy Procedure Code(s):     --- Professional ---                        43200, Esophagoscopy, flexible, transoral; diagnostic,                         including collection of specimen(s) by brushing or                         washing, when performed (separate procedure) Diagnosis Code(s):     --- Professional ---                        K22.2, Esophageal obstruction                        R13.10, Dysphagia, unspecified CPT copyright 2019 American Medical Association. All rights reserved. The codes documented in this report are preliminary and upon coder review may  be revised to meet current compliance requirements. [name], MD [name] MD, MD 05/18/2019 8:22:19 AM This report has been signed electronically. Number of Addenda: 0 Note Initiated On: 05/18/2019 7:32 AM Estimated Blood Loss:  Estimated blood loss: none.      [facility]

## 2019-05-18 NOTE — H&P (Signed)
Andrew Bellows, MD [address] [place], [place], [place], [address] [address] [place], [place], [place], [address] Phone: [phone]  Fax: [phone]  Primary Care Physician:  Baxter Hire, MD   Pre-Procedure History & Physical: HPI:  Andrew Mullins is a 64 y.o. male is here for an endoscopy    Past Medical History:  Diagnosis Date  . 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 GERD (gastroesophageal reflux disease)   . Hypertension   . Stroke ([place]) 2020   no wealness    Past Surgical History:  Procedure Laterality Date  . broken bones repair    . ESOPHAGOGASTRODUODENOSCOPY (EGD) WITH PROPOFOL N/A 02/18/2018   Procedure: ESOPHAGOGASTRODUODENOSCOPY (EGD) WITH PROPOFOL;  Surgeon: Andrew Bellows, MD;  Location: [facility] ENDOSCOPY;  Service: Gastroenterology;  Laterality: N/A;  . ESOPHAGOGASTRODUODENOSCOPY (EGD) WITH PROPOFOL N/A 04/01/2018   Procedure: ESOPHAGOGASTRODUODENOSCOPY (EGD) WITH PROPOFOL;  Surgeon: Andrew Bellows, MD;  Location: [facility] ENDOSCOPY;  Service: Gastroenterology;  Laterality: N/A;  . ESOPHAGOGASTRODUODENOSCOPY (EGD) WITH PROPOFOL N/A 12/02/2018   Procedure: ESOPHAGOGASTRODUODENOSCOPY (EGD) WITH PROPOFOL with Dilation;  Surgeon: Andrew Bellows, MD;  Location: [facility] ENDOSCOPY;  Service: Gastroenterology;  Laterality: N/A;  . EYE SURGERY     retinal detatchment  . FRACTURE SURGERY    . radical prostate    . TONSILLECTOMY      Prior to Admission medications   Medication Sig Start Date End Date Taking? Authorizing Provider  atorvastatin (LIPITOR) 40 MG tablet Take 1 tablet (40 mg total) by mouth daily at 6 PM. 09/18/18  Yes Fritzi Mandes, MD  famciclovir (FAMVIR) 250 MG tablet Take 250 mg by mouth 2 (two) times daily.   Yes [provider]  losartan (COZAAR) 50 MG tablet Take 50 mg by mouth daily. 08/20/18  Yes [provider]  metoprolol tartrate (LOPRESSOR) 25 MG tablet Take 25 mg by mouth 2 (two) times daily.   Yes [provider]  omeprazole  (PRILOSEC) 20 MG capsule Take 20 mg by mouth daily.   Yes [provider]  aspirin EC 325 MG EC tablet Take 1 tablet (325 mg total) by mouth daily. 09/19/18   Fritzi Mandes, MD    Allergies as of 05/06/2019  . (No Known Allergies)    History reviewed. No pertinent family history.  Social History   Socioeconomic History  . Marital status: Married    Spouse name: Not on file  . Number of children: Not on file  . Years of education: Not on file  . Highest education level: Not on file  Occupational History  . Not on file  Tobacco Use  . Smoking status: Former Research scientist (life sciences)  . Smokeless tobacco: Never Used  Substance and Sexual Activity  . Alcohol use: Never  . Drug use: Never  . Sexual activity: Not on file  Other Topics Concern  . Not on file  Social History Narrative   Lives at home with wife   Social Determinants of Health   Financial Resource Strain:   . Difficulty of Paying Living Expenses:   Food Insecurity:   . Worried About Charity fundraiser in the Last Year:   . Arboriculturist in the Last Year:   Transportation Needs:   . Film/video editor (Medical):   Marland Kitchen Lack of Transportation (Non-Medical):   Physical Activity:   . Days of Exercise per Week:   . Minutes of Exercise per Session:   Stress:   . Feeling of Stress :  Social Connections:   . Frequency of Communication with Friends and Family:   . Frequency of Social Gatherings with Friends and Family:   . Attends Religious Services:   . Active Member of Clubs or Organizations:   . Attends Archivist Meetings:   Marland Kitchen Marital Status:   Intimate Partner Violence:   . Fear of Current or Ex-Partner:   . Emotionally Abused:   Marland Kitchen Physically Abused:   . Sexually Abused:     Review of Systems: See HPI, otherwise negative ROS  Physical Exam: BP (!) 149/87   Pulse 63   Temp (!) 97.1 F (36.2 C) (Temporal)   Resp 16   Ht 6' (1.829 m)   Wt 95.3 kg   SpO2 98%   BMI 28.48 kg/m  General:    Alert,  pleasant and cooperative in NAD Head:  Normocephalic and atraumatic. Neck:  Supple; no masses or thyromegaly. Lungs:  Clear throughout to auscultation, normal respiratory effort.    Heart:  +S1, +S2, Regular rate and rhythm, No edema. Abdomen:  Soft, nontender and nondistended. Normal bowel sounds, without guarding, and without rebound.   Neurologic:  Alert and  oriented x4;  grossly normal neurologically.  Impression/Plan: Andrew Mullins is here for an endoscopy  to be performed for  evaluation of dysphagia    Risks, benefits, limitations, and alternatives regarding endoscopy have been reviewed with the patient.  Questions have been answered.  All parties agreeable.   Andrew Bellows, MD  05/18/2019, 8:08 AM

## 2019-05-19 ENCOUNTER — Ambulatory Visit: Payer: BC Managed Care – PPO | Admitting: Registered Nurse

## 2019-05-19 ENCOUNTER — Encounter: Payer: Self-pay | Admitting: *Deleted

## 2019-05-19 ENCOUNTER — Encounter
Admission: RE | Disposition: A | Discharge status: Home / Self Care | Payer: Self-pay | Source: Home / Self Care | Attending: Gastroenterology

## 2019-05-19 ENCOUNTER — Other Ambulatory Visit: Payer: Self-pay

## 2019-05-19 ENCOUNTER — Ambulatory Visit: Payer: BC Managed Care – PPO

## 2019-05-19 ENCOUNTER — Ambulatory Visit
Admission: RE | Admit: 2019-05-19 | Discharge: 2019-05-19 | Disposition: A | Discharge status: Home / Self Care | Payer: BC Managed Care – PPO | Attending: Gastroenterology | Admitting: Gastroenterology

## 2019-05-19 DIAGNOSIS — K219 Gastro-esophageal reflux disease without esophagitis: Secondary | ICD-10-CM | POA: Diagnosis not present

## 2019-05-19 DIAGNOSIS — I10 Essential (primary) hypertension: Secondary | ICD-10-CM | POA: Diagnosis not present

## 2019-05-19 DIAGNOSIS — R1310 Dysphagia, unspecified: Secondary | ICD-10-CM | POA: Insufficient documentation

## 2019-05-19 DIAGNOSIS — Z87891 Personal history of nicotine dependence: Secondary | ICD-10-CM | POA: Diagnosis not present

## 2019-05-19 DIAGNOSIS — Z8673 Personal history of transient ischemic attack (TIA), and cerebral infarction without residual deficits: Secondary | ICD-10-CM | POA: Diagnosis not present

## 2019-05-19 DIAGNOSIS — K222 Esophageal obstruction: Secondary | ICD-10-CM

## 2019-05-19 DIAGNOSIS — Z8501 Personal history of malignant neoplasm of esophagus: Secondary | ICD-10-CM | POA: Diagnosis not present

## 2019-05-19 DIAGNOSIS — Z7982 Long term (current) use of aspirin: Secondary | ICD-10-CM | POA: Diagnosis not present

## 2019-05-19 DIAGNOSIS — Z79899 Other long term (current) drug therapy: Secondary | ICD-10-CM | POA: Insufficient documentation

## 2019-05-19 HISTORY — PX: ESOPHAGOGASTRODUODENOSCOPY (EGD) WITH PROPOFOL: SHX5813

## 2019-05-19 SURGERY — ESOPHAGOGASTRODUODENOSCOPY (EGD) WITH PROPOFOL
Anesthesia: General

## 2019-05-19 MED ORDER — LIDOCAINE HCL (CARDIAC) PF 100 MG/5ML IV SOSY
PREFILLED_SYRINGE | INTRAVENOUS | Status: DC | PRN
  Administered 2019-05-19: 60 mg via INTRAVENOUS

## 2019-05-19 MED ORDER — PROPOFOL 10 MG/ML IV BOLUS
INTRAVENOUS | Status: DC | PRN
  Administered 2019-05-19: 50 mg via INTRAVENOUS

## 2019-05-19 MED ORDER — PROPOFOL 500 MG/50ML IV EMUL
INTRAVENOUS | Status: DC | PRN
  Administered 2019-05-19: 125 ug/kg/min via INTRAVENOUS

## 2019-05-19 MED ORDER — PROPOFOL 500 MG/50ML IV EMUL
INTRAVENOUS | Status: AC
  Filled 2019-05-19: qty 50

## 2019-05-19 MED ORDER — SODIUM CHLORIDE 0.9 % IV SOLN
INTRAVENOUS | Status: DC
  Administered 2019-05-19: 1000 mL via INTRAVENOUS

## 2019-05-19 NOTE — Transfer of Care (Signed)
Immediate Anesthesia Transfer of Care Note  Patient: Andrew Mullins  Procedure(s) Performed: ESOPHAGOGASTRODUODENOSCOPY (EGD) WITH PROPOFOL (N/A )  Patient Location: PACU and Endoscopy Unit  Anesthesia Type:General  Level of Consciousness: awake, alert  and oriented  Airway & Oxygen Therapy: Patient Spontanous Breathing  Post-op Assessment: Report given to RN and Post -op Vital signs reviewed and stable  Post vital signs: Reviewed and stable  Last Vitals:  Vitals Value Taken Time  BP 137/106 05/19/19 1226  Temp    Pulse 76 05/19/19 1227  Resp 22 05/19/19 1227  SpO2 97 % 05/19/19 1227  Vitals shown include unvalidated device data.  Last Pain:  Vitals:   05/19/19 1102  TempSrc: Tympanic         Complications: No apparent anesthesia complications

## 2019-05-19 NOTE — Anesthesia Preprocedure Evaluation (Signed)
Anesthesia Evaluation  Patient identified by MRN, date of birth, ID band Patient awake    Reviewed: Allergy & Precautions, NPO status , Patient's Chart, lab work & pertinent test results  History of Anesthesia Complications Negative for: history of anesthetic complications  Airway Mallampati: II       Dental no notable dental hx.    Pulmonary neg sleep apnea, neg COPD, former smoker,    - rhonchi (-) wheezing  (-) rales    Cardiovascular Exercise Tolerance: Good hypertension, Pt. on medications (-) CAD, (-) Past MI, (-) Cardiac Stents and (-) CABG  - Systolic murmurs and - Diastolic murmurs    Neuro/Psych neg Seizures CVA, No Residual Symptoms negative psych ROS   GI/Hepatic Neg liver ROS, GERD  ,  Endo/Other  negative endocrine ROSneg diabetes  Renal/GU negative Renal ROS     Musculoskeletal negative musculoskeletal ROS (+)   Abdominal (+) - obese,   Peds  Hematology negative hematology ROS (+)   Anesthesia Other Findings    Reproductive/Obstetrics                             Anesthesia Physical  Anesthesia Plan  ASA: II  Anesthesia Plan: General   Post-op Pain Management:    Induction: Intravenous  PONV Risk Score and Plan: 1 and Propofol infusion and TIVA  Airway Management Planned: Natural Airway  Additional Equipment:   Intra-op Plan:   Post-operative Plan:   Informed Consent: I have reviewed the patients History and Physical, chart, labs and discussed the procedure including the risks, benefits and alternatives for the proposed anesthesia with the patient or authorized representative who has indicated his/her understanding and acceptance.       Plan Discussed with:   Anesthesia Plan Comments:         Anesthesia Quick Evaluation

## 2019-05-19 NOTE — H&P (Signed)
[name], MD [address], [place], [place], [place], [address] [address] [place], [place], [place], [address] Phone: [phone]  Fax: [phone]  Primary Care Physician:  Baxter Hire, MD   Pre-Procedure History & Physical: HPI:  Andrew Mullins is a 64 y.o. male is here for an endoscopy    Past Medical History:  Diagnosis Date  . 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 GERD (gastroesophageal reflux disease)   . Hypertension   . Stroke ([place]) 2020   no wealness    Past Surgical History:  Procedure Laterality Date  . broken bones repair    . ESOPHAGOGASTRODUODENOSCOPY (EGD) WITH PROPOFOL N/A 02/18/2018   Procedure: ESOPHAGOGASTRODUODENOSCOPY (EGD) WITH PROPOFOL;  Surgeon: [name], MD;  Location: [facility] ENDOSCOPY;  Service: Gastroenterology;  Laterality: N/A;  . ESOPHAGOGASTRODUODENOSCOPY (EGD) WITH PROPOFOL N/A 04/01/2018   Procedure: ESOPHAGOGASTRODUODENOSCOPY (EGD) WITH PROPOFOL;  Surgeon: [name], MD;  Location: [facility] ENDOSCOPY;  Service: Gastroenterology;  Laterality: N/A;  . ESOPHAGOGASTRODUODENOSCOPY (EGD) WITH PROPOFOL N/A 12/02/2018   Procedure: ESOPHAGOGASTRODUODENOSCOPY (EGD) WITH PROPOFOL with Dilation;  Surgeon: [name], MD;  Location: [facility] ENDOSCOPY;  Service: Gastroenterology;  Laterality: N/A;  . ESOPHAGOGASTRODUODENOSCOPY (EGD) WITH PROPOFOL N/A 05/18/2019   Procedure: ESOPHAGOGASTRODUODENOSCOPY (EGD) WITH PROPOFOL with Dilation;  Surgeon: [name], MD;  Location: [facility] ENDOSCOPY;  Service: Gastroenterology;  Laterality: N/A;  . EYE SURGERY     retinal detatchment  . FRACTURE SURGERY    . radical prostate    . TONSILLECTOMY      Prior to Admission medications   Medication Sig Start Date End Date Taking? Authorizing Provider  atorvastatin (LIPITOR) 40 MG tablet Take 1 tablet (40 mg total) by mouth daily at 6 PM. 09/18/18  Yes Fritzi Mandes, MD  famciclovir (FAMVIR) 250 MG tablet Take 250 mg by mouth 2 (two) times daily.   Yes [provider]  losartan (COZAAR) 50 MG tablet Take 50 mg by mouth daily. 08/20/18  Yes [provider]  metoprolol tartrate (LOPRESSOR) 25 MG tablet Take 25 mg by mouth 2 (two) times daily.   Yes [provider]  omeprazole (PRILOSEC) 20 MG capsule Take 20 mg by mouth daily.   Yes [provider]  aspirin EC 325 MG EC tablet Take 1 tablet (325 mg total) by mouth daily. 09/19/18   Fritzi Mandes, MD    Allergies as of 05/18/2019  . (No Known Allergies)    No family history on file.  Social History   Socioeconomic History  . Marital status: Married    Spouse name: Not on file  . Number of children: Not on file  . Years of education: Not on file  . Highest education level: Not on file  Occupational History  . Not on file  Tobacco Use  . Smoking status: Former Research scientist (life sciences)  . Smokeless tobacco: Never Used  Substance and Sexual Activity  . Alcohol use: Never  . Drug use: Never  . Sexual activity: Not on file  Other Topics Concern  . Not on file  Social History Narrative   Lives at home with wife   Social Determinants of Health   Financial Resource Strain:   . Difficulty of Paying Living Expenses:   Food Insecurity:   . Worried About Charity fundraiser in the Last Year:   . Arboriculturist in the Last Year:   Transportation Needs:   . Film/video editor (Medical):   Marland Kitchen Lack of Transportation (  Non-Medical):   Physical Activity:   . Days of Exercise per Week:   . Minutes of Exercise per Session:   Stress:   . Feeling of Stress :   Social Connections:   . Frequency of Communication with Friends and Family:   . Frequency of Social Gatherings with Friends and Family:   . Attends Religious Services:   . Active Member of Clubs or Organizations:   . Attends Archivist Meetings:   Marland Kitchen Marital Status:   Intimate Partner Violence:   . Fear of Current or Ex-Partner:   . Emotionally Abused:   Marland Kitchen Physically Abused:   . Sexually Abused:     Review  of Systems: See HPI, otherwise negative ROS  Physical Exam: There were no vitals taken for this visit. General:   Alert,  pleasant and cooperative in NAD Head:  Normocephalic and atraumatic. Neck:  Supple; no masses or thyromegaly. Lungs:  Clear throughout to auscultation, normal respiratory effort.    Heart:  +S1, +S2, Regular rate and rhythm, No edema. Abdomen:  Soft, nontender and nondistended. Normal bowel sounds, without guarding, and without rebound.   Neurologic:  Alert and  oriented x4;  grossly normal neurologically.  Impression/Plan: Andrew Mullins is here for an endoscopy  to be performed for  evaluation of dysphagia    Risks, benefits, limitations, and alternatives regarding endoscopy have been reviewed with the patient.  Questions have been answered.  All parties agreeable.   [name], MD  05/19/2019, 11:06 AM

## 2019-05-19 NOTE — Op Note (Signed)
[facility] Patient Name: Andrew Mullins Procedure Date: 05/19/2019 11:57 AM MRN: [id number] Account #: [id number] Date of Birth: 18-Feb-1955 Admit Type: Outpatient Age: 64 Room: [facility] ENDO ROOM 4 Gender: Male Note Status: Finalized Procedure:             Upper GI endoscopy Indications:           Dysphagia Providers:             [name] MD, MD Referring MD:          Baxter Hire, MD (Referring MD) Medicines:             Monitored Anesthesia Care Complications:         No immediate complications. Procedure:             Pre-Anesthesia Assessment:                        - Prior to the procedure, a History and Physical was                         performed, and patient medications, allergies and                         sensitivities were reviewed. The patient's tolerance                         of previous anesthesia was reviewed.                        - The risks and benefits of the procedure and the                         sedation options and risks were discussed with the                         patient. All questions were answered and informed                         consent was obtained.                        - ASA Grade Assessment: II - A patient with mild                         systemic disease.                        After obtaining informed consent, the endoscope was                         passed under direct vision. Throughout the procedure,                         the patient's blood pressure, pulse, and oxygen                         saturations were monitored continuously. The Endoscope                         was introduced through the mouth, and advanced  to the                         third part of duodenum. The upper GI endoscopy was                         accomplished with ease. The patient tolerated the                         procedure well. Findings:      One benign-appearing, intrinsic severe (stenosis; an endoscope  cannot       pass) stenosis was found in the upper third of the esophagus. This       stenosis measured 8 mm (inner diameter) x less than one cm (in length).       The stenosis was traversed after dilation. A TTS dilator was passed       through the scope. Dilation with an 08-23-08 mm balloon dilator was       performed to 11 mm. The dilation site was examined and showed moderate       improvement in luminal narrowing.      The cardia and gastric fundus were normal on retroflexion. Impression:            - Benign-appearing esophageal stenosis. Dilated.                        - No specimens collected. Recommendation:        - Discharge patient to home (with escort).                        - Resume previous diet.                        - Continue present medications.                        - Repeat upper endoscopy in 2 weeks for retreatment. Procedure Code(s):     --- Professional ---                        [id number], Esophagogastroduodenoscopy, flexible,                         transoral; with transendoscopic balloon dilation of                         esophagus (less than 30 mm diameter) Diagnosis Code(s):     --- Professional ---                        K22.2, Esophageal obstruction                        R13.10, Dysphagia, unspecified CPT copyright 2019 American Medical Association. All rights reserved. The codes documented in this report are preliminary and upon coder review may  be revised to meet current compliance requirements. [name], MD [name] MD, MD 05/19/2019 12:20:23 PM This report has been signed electronically. Number of Addenda: 0 Note Initiated On: 05/19/2019 11:57 AM Estimated Blood Loss:  Estimated blood loss: none.      [facility]

## 2019-05-19 NOTE — Anesthesia Postprocedure Evaluation (Signed)
Anesthesia Post Note  Patient: Andrew Mullins  Procedure(s) Performed: ESOPHAGOGASTRODUODENOSCOPY (EGD) WITH PROPOFOL (N/A )  Patient location during evaluation: Endoscopy Anesthesia Type: General Level of consciousness: awake and alert and oriented Pain management: pain level controlled Vital Signs Assessment: post-procedure vital signs reviewed and stable Respiratory status: spontaneous breathing, nonlabored ventilation and respiratory function stable Cardiovascular status: blood pressure returned to baseline and stable Postop Assessment: no signs of nausea or vomiting Anesthetic complications: no     Last Vitals:  Vitals:   05/19/19 1227 05/19/19 1237  BP: (!) 137/106 (!) 139/104  Pulse: 70 (!) 59  Resp: 13 18  Temp: (!) 36.1 C   SpO2: 97% 97%    Last Pain:  Vitals:   05/19/19 1237  TempSrc:   PainSc: 0-No pain                 Hanny Elsberry

## 2019-05-20 ENCOUNTER — Encounter: Payer: Self-pay | Admitting: *Deleted

## 2019-05-25 ENCOUNTER — Encounter: Payer: Self-pay | Admitting: *Deleted

## 2019-05-25 ENCOUNTER — Telehealth: Payer: Self-pay | Admitting: Gastroenterology

## 2019-05-25 ENCOUNTER — Other Ambulatory Visit: Payer: Self-pay

## 2019-05-25 DIAGNOSIS — R1310 Dysphagia, unspecified: Secondary | ICD-10-CM

## 2019-05-25 DIAGNOSIS — K222 Esophageal obstruction: Secondary | ICD-10-CM

## 2019-05-25 NOTE — Telephone Encounter (Signed)
Spoke with pt and informed him that per Dr. Vicente Males, the repeat EGD with Dilation cannot be performed until at least 2 weeks after his last EGD as pt needs that time to allow healing. Pt verbalized understanding and agrees. We have scheduled pt's repeat procedure.

## 2019-05-25 NOTE — Telephone Encounter (Signed)
Patient states Dr. Vicente Males wanted him to have a repeat colon asap and pt wants to schedule for this week if possible. Please call pt back to schedule.

## 2019-06-23 ENCOUNTER — Other Ambulatory Visit
Admission: RE | Admit: 2019-06-23 | Discharge: 2019-06-23 | Disposition: A | Discharge status: Home / Self Care | Payer: BC Managed Care – PPO | Source: Ambulatory Visit | Attending: Gastroenterology | Admitting: Gastroenterology

## 2019-06-23 ENCOUNTER — Other Ambulatory Visit: Payer: Self-pay

## 2019-06-23 DIAGNOSIS — Z01812 Encounter for preprocedural laboratory examination: Secondary | ICD-10-CM | POA: Diagnosis present

## 2019-06-23 DIAGNOSIS — Z20822 Contact with and (suspected) exposure to covid-19: Secondary | ICD-10-CM | POA: Insufficient documentation

## 2019-06-23 LAB — SARS CORONAVIRUS 2 (TAT 6-24 HRS): SARS Coronavirus 2: NEGATIVE

## 2019-06-25 ENCOUNTER — Ambulatory Visit
Admission: RE | Admit: 2019-06-25 | Discharge: 2019-06-25 | Disposition: A | Discharge status: Home / Self Care | Payer: BC Managed Care – PPO | Attending: Gastroenterology | Admitting: Gastroenterology

## 2019-06-25 ENCOUNTER — Ambulatory Visit: Payer: BC Managed Care – PPO | Admitting: Certified Registered"

## 2019-06-25 ENCOUNTER — Encounter
Admission: RE | Disposition: A | Discharge status: Home / Self Care | Payer: Self-pay | Source: Home / Self Care | Attending: Gastroenterology

## 2019-06-25 ENCOUNTER — Encounter: Payer: Self-pay | Admitting: Gastroenterology

## 2019-06-25 ENCOUNTER — Other Ambulatory Visit: Payer: Self-pay

## 2019-06-25 DIAGNOSIS — Z79899 Other long term (current) drug therapy: Secondary | ICD-10-CM | POA: Diagnosis not present

## 2019-06-25 DIAGNOSIS — Z8673 Personal history of transient ischemic attack (TIA), and cerebral infarction without residual deficits: Secondary | ICD-10-CM | POA: Insufficient documentation

## 2019-06-25 DIAGNOSIS — K219 Gastro-esophageal reflux disease without esophagitis: Secondary | ICD-10-CM | POA: Insufficient documentation

## 2019-06-25 DIAGNOSIS — R1310 Dysphagia, unspecified: Secondary | ICD-10-CM | POA: Diagnosis not present

## 2019-06-25 DIAGNOSIS — Z87891 Personal history of nicotine dependence: Secondary | ICD-10-CM | POA: Diagnosis not present

## 2019-06-25 DIAGNOSIS — I10 Essential (primary) hypertension: Secondary | ICD-10-CM | POA: Diagnosis not present

## 2019-06-25 DIAGNOSIS — Z7982 Long term (current) use of aspirin: Secondary | ICD-10-CM | POA: Diagnosis not present

## 2019-06-25 DIAGNOSIS — K222 Esophageal obstruction: Secondary | ICD-10-CM

## 2019-06-25 HISTORY — PX: ESOPHAGOGASTRODUODENOSCOPY (EGD) WITH PROPOFOL: SHX5813

## 2019-06-25 SURGERY — ESOPHAGOGASTRODUODENOSCOPY (EGD) WITH PROPOFOL
Anesthesia: General

## 2019-06-25 MED ORDER — PROPOFOL 10 MG/ML IV BOLUS
INTRAVENOUS | Status: DC | PRN
  Administered 2019-06-25: 50 mg via INTRAVENOUS
  Administered 2019-06-25: 100 mg via INTRAVENOUS
  Administered 2019-06-25 (×2): 50 mg via INTRAVENOUS

## 2019-06-25 MED ORDER — SODIUM CHLORIDE 0.9 % IV SOLN
INTRAVENOUS | Status: DC
  Administered 2019-06-25: 1000 mL via INTRAVENOUS

## 2019-06-25 MED ORDER — PROPOFOL 500 MG/50ML IV EMUL
INTRAVENOUS | Status: AC
  Filled 2019-06-25: qty 50

## 2019-06-25 MED ORDER — GLYCOPYRROLATE 0.2 MG/ML IJ SOLN
INTRAMUSCULAR | Status: DC | PRN
  Administered 2019-06-25: .1 mg via INTRAVENOUS

## 2019-06-25 MED ORDER — LIDOCAINE HCL (CARDIAC) PF 100 MG/5ML IV SOSY
PREFILLED_SYRINGE | INTRAVENOUS | Status: DC | PRN
  Administered 2019-06-25: 60 mg via INTRAVENOUS

## 2019-06-25 MED ORDER — LIDOCAINE HCL (PF) 2 % IJ SOLN
INTRAMUSCULAR | Status: AC
  Filled 2019-06-25: qty 10

## 2019-06-25 NOTE — Transfer of Care (Signed)
Immediate Anesthesia Transfer of Care Note  Patient: Andrew Mullins  Procedure(s) Performed: ESOPHAGOGASTRODUODENOSCOPY (EGD) WITH PROPOFOL  with Dilation (N/A )  Patient Location: PACU and Endoscopy Unit  Anesthesia Type:General  Level of Consciousness: awake, alert  and oriented  Airway & Oxygen Therapy: Patient Spontanous Breathing  Post-op Assessment: Report given to RN and Post -op Vital signs reviewed and stable  Post vital signs: Reviewed and stable  Last Vitals:  Vitals Value Taken Time  BP 132/93 06/25/19 0824  Temp    Pulse 66 06/25/19 0825  Resp 17 06/25/19 0825  SpO2 96 % 06/25/19 0825  Vitals shown include unvalidated device data.  Last Pain:  Vitals:   06/25/19 0734  TempSrc: Tympanic  PainSc: 0-No pain         Complications: No complications documented.

## 2019-06-25 NOTE — Anesthesia Preprocedure Evaluation (Signed)
Anesthesia Evaluation  Patient identified by MRN, date of birth, ID band Patient awake    Reviewed: Allergy & Precautions, H&P , NPO status , Patient's Chart, lab work & pertinent test results  History of Anesthesia Complications Negative for: history of anesthetic complications  Airway Mallampati: III  TM Distance: <3 FB Neck ROM: limited    Dental  (+) Chipped   Pulmonary neg pulmonary ROS, neg shortness of breath, former smoker,           Cardiovascular Exercise Tolerance: Good hypertension, (-) angina(-) Past MI and (-) DOE      Neuro/Psych CVA negative psych ROS   GI/Hepatic Neg liver ROS, GERD  Medicated and Controlled,  Endo/Other  negative endocrine ROS  Renal/GU negative Renal ROS  negative genitourinary   Musculoskeletal   Abdominal   Peds  Hematology negative hematology ROS (+)   Anesthesia Other Findings Past Medical History: No date: Cancer ([place])     Comment:  esophageal No date: GERD (gastroesophageal reflux disease) No date: Hypertension  Past Surgical History: No date: broken bones repair 02/18/2018: ESOPHAGOGASTRODUODENOSCOPY (EGD) WITH PROPOFOL; N/A     Comment:  Procedure: ESOPHAGOGASTRODUODENOSCOPY (EGD) WITH               PROPOFOL;  Surgeon: [name], MD;  Location: [facility]               ENDOSCOPY;  Service: Gastroenterology;  Laterality: N/A; 04/01/2018: ESOPHAGOGASTRODUODENOSCOPY (EGD) WITH PROPOFOL; N/A     Comment:  Procedure: ESOPHAGOGASTRODUODENOSCOPY (EGD) WITH               PROPOFOL;  Surgeon: [name], MD;  Location: [facility]               ENDOSCOPY;  Service: Gastroenterology;  Laterality: N/A; No date: EYE SURGERY     Comment:  retinal detatchment No date: FRACTURE SURGERY No date: radical prostate No date: TONSILLECTOMY  BMI    Body Mass Index: 29.16 kg/m      Reproductive/Obstetrics negative OB ROS                             Anesthesia  Physical  Anesthesia Plan  ASA: III  Anesthesia Plan: General   Post-op Pain Management:    Induction: Intravenous  PONV Risk Score and Plan: Propofol infusion and TIVA  Airway Management Planned: Natural Airway and Nasal Cannula  Additional Equipment:   Intra-op Plan:   Post-operative Plan:   Informed Consent: I have reviewed the patients History and Physical, chart, labs and discussed the procedure including the risks, benefits and alternatives for the proposed anesthesia with the patient or authorized representative who has indicated his/her understanding and acceptance.     Dental Advisory Given  Plan Discussed with: Anesthesiologist, CRNA and Surgeon  Anesthesia Plan Comments: (Patient has medical clearance for the procedure.  Patient consented for risk of stroke or re stroke.  He voiced understanding.  Patient consented for risks of anesthesia including but not limited to:  - adverse reactions to medications - risk of intubation if required - damage to eyes, teeth, lips or other oral mucosa - nerve damage due to positioning  - sore throat or hoarseness - Damage to heart, brain, nerves, lungs, other parts of body or loss of life  Patient voiced understanding.)        Anesthesia Quick Evaluation

## 2019-06-25 NOTE — Op Note (Signed)
[facility] Gastroenterology Patient Name: Andrew Mullins Procedure Date: 06/25/2019 7:21 AM MRN: [id number] Account #: [id number] Date of Birth: [birth year] Admit Type: Inpatient Age: 64 Room: [facility] ENDO ROOM 1 Gender: Male Note Status: Finalized Procedure:             Upper GI endoscopy Indications:           Follow-up of esophageal stricture Providers:             [name] MD, MD Referring MD:          Andres Labrum, MD (Referring MD) Medicines:             Monitored Anesthesia Care Complications:         No immediate complications. Procedure:             Pre-Anesthesia Assessment:                        - Prior to the procedure, a History and Physical was                         performed, and patient medications, allergies and                         sensitivities were reviewed. The patient's tolerance                         of previous anesthesia was reviewed.                        - The risks and benefits of the procedure and the                         sedation options and risks were discussed with the                         patient. All questions were answered and informed                         consent was obtained.                        - ASA Grade Assessment: II - A patient with mild                         systemic disease.                        After obtaining informed consent, the endoscope was                         passed under direct vision. Throughout the procedure,                         the patient's blood pressure, pulse, and oxygen                         saturations were monitored continuously. The Endoscope                         was introduced through the  mouth, and advanced to the                         third part of duodenum. The upper GI endoscopy was                         accomplished with ease. The patient tolerated the                         procedure well. Findings:      The examined duodenum was normal.       The stomach was normal.      The cardia and gastric fundus were normal on retroflexion.      One benign-appearing, intrinsic moderate (circumferential scarring or       stenosis; an endoscope may pass) stenosis was found in the upper third       of the esophagus. This stenosis measured 1 cm (inner diameter) x 1 cm       (in length). The stenosis was traversed. A TTS dilator was passed       through the scope. Dilation with a 10-26-10 mm balloon and a 12-13.5-15       mm balloon dilator was performed to 13.5 mm. The dilation site was       examined and showed moderate mucosal disruption. Impression:            - Normal examined duodenum.                        - Normal stomach.                        - Benign-appearing esophageal stenosis. Dilated.                        - No specimens collected. Recommendation:        - Discharge patient to home (with escort).                        - Resume previous diet.                        - Continue present medications.                        - Repeat upper endoscopy in 2 weeks to evaluate the                         response to therapy. Procedure Code(s):     --- Professional ---                        [phone], Esophagogastroduodenoscopy, flexible,                         transoral; with transendoscopic balloon dilation of                         esophagus (less than 30 mm diameter) Diagnosis Code(s):     --- Professional ---                        K22.2, Esophageal obstruction  CPT copyright 2019 American Medical Association. All rights reserved. The codes documented in this report are preliminary and upon coder review may  be revised to meet current compliance requirements. [name], MD [name] MD, MD 06/25/2019 8:21:16 AM This report has been signed electronically. Number of Addenda: 0 Note Initiated On: 06/25/2019 7:21 AM Estimated Blood Loss:  Estimated blood loss: none.      [facility]

## 2019-06-25 NOTE — H&P (Signed)
[name], MD [address] [place], [place], [place], [address] [address], [place], [place], [place], [address] Phone: [phone]  Fax: [phone]  Primary Care Physician:  Baxter Hire, MD   Pre-Procedure History & Physical: HPI:  Andrew Mullins is a 64 y.o. male is here for an endoscopy    Past Medical History:  Diagnosis Date  . 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 GERD (gastroesophageal reflux disease)   . Hypertension   . Stroke ([place]) 2020   no wealness    Past Surgical History:  Procedure Laterality Date  . broken bones repair    . ESOPHAGOGASTRODUODENOSCOPY (EGD) WITH PROPOFOL N/A 02/18/2018   Procedure: ESOPHAGOGASTRODUODENOSCOPY (EGD) WITH PROPOFOL;  Surgeon: [name], MD;  Location: [facility] ENDOSCOPY;  Service: Gastroenterology;  Laterality: N/A;  . ESOPHAGOGASTRODUODENOSCOPY (EGD) WITH PROPOFOL N/A 04/01/2018   Procedure: ESOPHAGOGASTRODUODENOSCOPY (EGD) WITH PROPOFOL;  Surgeon: [name], MD;  Location: [facility] ENDOSCOPY;  Service: Gastroenterology;  Laterality: N/A;  . ESOPHAGOGASTRODUODENOSCOPY (EGD) WITH PROPOFOL N/A 12/02/2018   Procedure: ESOPHAGOGASTRODUODENOSCOPY (EGD) WITH PROPOFOL with Dilation;  Surgeon: [name], MD;  Location: [facility] ENDOSCOPY;  Service: Gastroenterology;  Laterality: N/A;  . ESOPHAGOGASTRODUODENOSCOPY (EGD) WITH PROPOFOL N/A 05/19/2019   Procedure: ESOPHAGOGASTRODUODENOSCOPY (EGD) WITH PROPOFOL;  Surgeon: [name], MD;  Location: [facility] ENDOSCOPY;  Service: Gastroenterology;  Laterality: N/A;  . ESOPHAGOGASTRODUODENOSCOPY (EGD) WITH PROPOFOL N/A 05/18/2019   Procedure: ESOPHAGOGASTRODUODENOSCOPY (EGD) WITH PROPOFOL with Dilation;  Surgeon: [name], MD;  Location: [facility] ENDOSCOPY;  Service: Gastroenterology;  Laterality: N/A;  . EYE SURGERY     retinal detatchment  . FRACTURE SURGERY    . radical prostate    . TONSILLECTOMY      Prior to Admission medications   Medication Sig Start Date End Date Taking? Authorizing  Provider  atorvastatin (LIPITOR) 40 MG tablet Take 1 tablet (40 mg total) by mouth daily at 6 PM. 09/18/18  Yes Fritzi Mandes, MD  famciclovir (FAMVIR) 250 MG tablet Take 250 mg by mouth 2 (two) times daily.   Yes [provider]  losartan (COZAAR) 50 MG tablet Take 50 mg by mouth daily. 08/20/18  Yes [provider]  metoprolol tartrate (LOPRESSOR) 25 MG tablet Take 25 mg by mouth 2 (two) times daily.   Yes [provider]  omeprazole (PRILOSEC) 20 MG capsule Take 20 mg by mouth daily.   Yes [provider]  aspirin EC 325 MG EC tablet Take 1 tablet (325 mg total) by mouth daily. 09/19/18   Fritzi Mandes, MD    Allergies as of 05/25/2019  . (No Known Allergies)    History reviewed. No pertinent family history.  Social History   Socioeconomic History  . Marital status: Married    Spouse name: Not on file  . Number of children: Not on file  . Years of education: Not on file  . Highest education level: Not on file  Occupational History  . Not on file  Tobacco Use  . Smoking status: Former Research scientist (life sciences)  . Smokeless tobacco: Never Used  Vaping Use  . Vaping Use: Never used  Substance and Sexual Activity  . Alcohol use: Never  . Drug use: Never  . Sexual activity: Not on file  Other Topics Concern  . Not on file  Social History Narrative   Lives at home with wife   Social Determinants of Health   Financial Resource Strain:   . Difficulty of Paying Living Expenses:   Food Insecurity:   .  Worried About Charity fundraiser in the Last Year:   . Arboriculturist in the Last Year:   Transportation Needs:   . Film/video editor (Medical):   Marland Kitchen Lack of Transportation (Non-Medical):   Physical Activity:   . Days of Exercise per Week:   . Minutes of Exercise per Session:   Stress:   . Feeling of Stress :   Social Connections:   . Frequency of Communication with Friends and Family:   . Frequency of Social Gatherings with Friends and Family:   . Attends  Religious Services:   . Active Member of Clubs or Organizations:   . Attends Archivist Meetings:   Marland Kitchen Marital Status:   Intimate Partner Violence:   . Fear of Current or Ex-Partner:   . Emotionally Abused:   Marland Kitchen Physically Abused:   . Sexually Abused:     Review of Systems: See HPI, otherwise negative ROS  Physical Exam: BP (!) 145/94   Pulse 65   Temp (!) 97.2 F (36.2 C) (Tympanic)   Resp 16   Ht 6' (1.829 m)   Wt 95.3 kg   SpO2 99%   BMI 28.48 kg/m  General:   Alert,  pleasant and cooperative in NAD Head:  Normocephalic and atraumatic. Neck:  Supple; no masses or thyromegaly. Lungs:  Clear throughout to auscultation, normal respiratory effort.    Heart:  +S1, +S2, Regular rate and rhythm, No edema. Abdomen:  Soft, nontender and nondistended. Normal bowel sounds, without guarding, and without rebound.   Neurologic:  Alert and  oriented x4;  grossly normal neurologically.  Impression/Plan: Andrew Mullins is here for an endoscopy  to be performed for  evaluation ofdysphagia    Risks, benefits, limitations, and alternatives regarding endoscopy have been reviewed with the patient.  Questions have been answered.  All parties agreeable.   [name], MD  06/25/2019, 8:04 AM

## 2019-06-25 NOTE — Anesthesia Postprocedure Evaluation (Signed)
Anesthesia Post Note  Patient: Andrew Mullins  Procedure(s) Performed: ESOPHAGOGASTRODUODENOSCOPY (EGD) WITH PROPOFOL  with Dilation (N/A )  Patient location during evaluation: Endoscopy Anesthesia Type: General Level of consciousness: awake and alert Pain management: pain level controlled Vital Signs Assessment: post-procedure vital signs reviewed and stable Respiratory status: spontaneous breathing, nonlabored ventilation, respiratory function stable and patient connected to nasal cannula oxygen Cardiovascular status: blood pressure returned to baseline and stable Postop Assessment: no apparent nausea or vomiting Anesthetic complications: no   No complications documented.   Last Vitals:  Vitals:   06/25/19 0831 06/25/19 0840  BP: (!) 143/85 128/85  Pulse:  (!) 53  Resp:  15  Temp:    SpO2:  95%    Last Pain:  Vitals:   06/25/19 0820  TempSrc: Tympanic  PainSc:                  Precious Haws Yanelli Zapanta

## 2019-06-26 ENCOUNTER — Encounter: Payer: Self-pay | Admitting: Gastroenterology

## 2019-06-30 ENCOUNTER — Telehealth: Payer: Self-pay

## 2019-06-30 ENCOUNTER — Other Ambulatory Visit: Payer: Self-pay

## 2019-06-30 DIAGNOSIS — K222 Esophageal obstruction: Secondary | ICD-10-CM

## 2019-06-30 DIAGNOSIS — R1310 Dysphagia, unspecified: Secondary | ICD-10-CM

## 2019-06-30 NOTE — Telephone Encounter (Signed)
-----   Message from [name], MD sent at 06/25/2019  8:25 AM EDT ----- Sherald Hess  Please Schedule her for a EGD and dilation to be repeated in 2 weeks please

## 2019-06-30 NOTE — Telephone Encounter (Signed)
Called pt to schedule repeat EGD. Unable to contact, LVM to return call.

## 2019-06-30 NOTE — Telephone Encounter (Signed)
Spoke with pt and was able to schedule repeat EGD.

## 2019-07-07 ENCOUNTER — Other Ambulatory Visit: Payer: BC Managed Care – PPO

## 2019-07-08 ENCOUNTER — Encounter: Payer: Self-pay | Admitting: Gastroenterology

## 2019-07-08 ENCOUNTER — Telehealth: Payer: Self-pay

## 2019-07-08 ENCOUNTER — Other Ambulatory Visit: Payer: Self-pay

## 2019-07-08 ENCOUNTER — Other Ambulatory Visit
Admission: RE | Admit: 2019-07-08 | Discharge: 2019-07-08 | Disposition: A | Discharge status: Home / Self Care | Payer: BC Managed Care – PPO | Source: Ambulatory Visit | Attending: Gastroenterology | Admitting: Gastroenterology

## 2019-07-08 DIAGNOSIS — Z01812 Encounter for preprocedural laboratory examination: Secondary | ICD-10-CM | POA: Insufficient documentation

## 2019-07-08 DIAGNOSIS — Z20822 Contact with and (suspected) exposure to covid-19: Secondary | ICD-10-CM | POA: Insufficient documentation

## 2019-07-08 LAB — SARS CORONAVIRUS 2 (TAT 6-24 HRS): SARS Coronavirus 2: NEGATIVE

## 2019-07-08 NOTE — Telephone Encounter (Signed)
Patient has a EGD scheduled for 07/09/2019 but patient has not went for COVID test. Called patient and left a message on home number and also left a message on mobile number. Left a detail message stating that if patient did not go before 11:00am for his covid test we we would have to rescheduled his procedure.

## 2019-07-08 NOTE — Telephone Encounter (Signed)
Per Andrew Mullins patient is going for COVID test now and he went yesterday but got there after they closed.

## 2019-07-09 ENCOUNTER — Encounter: Payer: Self-pay | Admitting: Gastroenterology

## 2019-07-09 ENCOUNTER — Ambulatory Visit
Admission: RE | Admit: 2019-07-09 | Discharge: 2019-07-09 | Disposition: A | Discharge status: Home / Self Care | Payer: BC Managed Care – PPO | Attending: Gastroenterology | Admitting: Gastroenterology

## 2019-07-09 ENCOUNTER — Ambulatory Visit: Payer: BC Managed Care – PPO | Admitting: Anesthesiology

## 2019-07-09 ENCOUNTER — Encounter
Admission: RE | Disposition: A | Discharge status: Home / Self Care | Payer: Self-pay | Source: Home / Self Care | Attending: Gastroenterology

## 2019-07-09 DIAGNOSIS — Z7982 Long term (current) use of aspirin: Secondary | ICD-10-CM | POA: Insufficient documentation

## 2019-07-09 DIAGNOSIS — K222 Esophageal obstruction: Secondary | ICD-10-CM | POA: Diagnosis present

## 2019-07-09 DIAGNOSIS — Z8501 Personal history of malignant neoplasm of esophagus: Secondary | ICD-10-CM | POA: Insufficient documentation

## 2019-07-09 DIAGNOSIS — K219 Gastro-esophageal reflux disease without esophagitis: Secondary | ICD-10-CM | POA: Diagnosis not present

## 2019-07-09 DIAGNOSIS — Z8673 Personal history of transient ischemic attack (TIA), and cerebral infarction without residual deficits: Secondary | ICD-10-CM | POA: Diagnosis not present

## 2019-07-09 DIAGNOSIS — Z79899 Other long term (current) drug therapy: Secondary | ICD-10-CM | POA: Insufficient documentation

## 2019-07-09 DIAGNOSIS — I10 Essential (primary) hypertension: Secondary | ICD-10-CM | POA: Diagnosis not present

## 2019-07-09 DIAGNOSIS — Z87891 Personal history of nicotine dependence: Secondary | ICD-10-CM | POA: Insufficient documentation

## 2019-07-09 DIAGNOSIS — Z8546 Personal history of malignant neoplasm of prostate: Secondary | ICD-10-CM | POA: Diagnosis not present

## 2019-07-09 HISTORY — DX: Malignant neoplasm of prostate: C61

## 2019-07-09 HISTORY — PX: ESOPHAGOGASTRODUODENOSCOPY (EGD) WITH PROPOFOL: SHX5813

## 2019-07-09 SURGERY — ESOPHAGOGASTRODUODENOSCOPY (EGD) WITH PROPOFOL
Anesthesia: General

## 2019-07-09 MED ORDER — SODIUM CHLORIDE 0.9 % IV SOLN: INTRAVENOUS | Status: DC | PRN

## 2019-07-09 MED ORDER — PROPOFOL 500 MG/50ML IV EMUL
INTRAVENOUS | Status: DC | PRN
  Administered 2019-07-09: 150 ug/kg/min via INTRAVENOUS

## 2019-07-09 NOTE — Anesthesia Preprocedure Evaluation (Signed)
Anesthesia Evaluation  Patient identified by MRN, date of birth, ID band Patient awake    Reviewed: Allergy & Precautions, H&P , NPO status , Patient's Chart, lab work & pertinent test results  History of Anesthesia Complications (+) PROLONGED EMERGENCE and history of anesthetic complications  Airway Mallampati: III  TM Distance: <3 FB Neck ROM: limited    Dental  (+) Chipped   Pulmonary neg pulmonary ROS, neg shortness of breath, former smoker,           Cardiovascular Exercise Tolerance: Good hypertension, (-) angina(-) Past MI and (-) DOE      Neuro/Psych CVA negative psych ROS   GI/Hepatic Neg liver ROS, GERD  Medicated and Controlled,  Endo/Other  negative endocrine ROS  Renal/GU negative Renal ROS  negative genitourinary   Musculoskeletal   Abdominal   Peds  Hematology negative hematology ROS (+)   Anesthesia Other Findings Past Medical History: No date: Cancer ([place])     Comment:  esophageal No date: GERD (gastroesophageal reflux disease) No date: Hypertension  Past Surgical History: No date: broken bones repair 02/18/2018: ESOPHAGOGASTRODUODENOSCOPY (EGD) WITH PROPOFOL; N/A     Comment:  Procedure: ESOPHAGOGASTRODUODENOSCOPY (EGD) WITH               PROPOFOL;  Surgeon: [name], MD;  Location: [facility]               ENDOSCOPY;  Service: Gastroenterology;  Laterality: N/A; 04/01/2018: ESOPHAGOGASTRODUODENOSCOPY (EGD) WITH PROPOFOL; N/A     Comment:  Procedure: ESOPHAGOGASTRODUODENOSCOPY (EGD) WITH               PROPOFOL;  Surgeon: [name], MD;  Location: [facility]               ENDOSCOPY;  Service: Gastroenterology;  Laterality: N/A; No date: EYE SURGERY     Comment:  retinal detatchment No date: FRACTURE SURGERY No date: radical prostate No date: TONSILLECTOMY  BMI    Body Mass Index: 29.16 kg/m      Reproductive/Obstetrics negative OB ROS                              Anesthesia Physical  Anesthesia Plan  ASA: III  Anesthesia Plan: General   Post-op Pain Management:    Induction: Intravenous  PONV Risk Score and Plan: Propofol infusion and TIVA  Airway Management Planned: Natural Airway and Nasal Cannula  Additional Equipment:   Intra-op Plan:   Post-operative Plan:   Informed Consent: I have reviewed the patients History and Physical, chart, labs and discussed the procedure including the risks, benefits and alternatives for the proposed anesthesia with the patient or authorized representative who has indicated his/her understanding and acceptance.     Dental Advisory Given  Plan Discussed with: Anesthesiologist, CRNA and Surgeon  Anesthesia Plan Comments: (Patient has medical clearance for the procedure.  Patient consented for risk of stroke or re stroke.  He voiced understanding.  Patient consented for risks of anesthesia including but not limited to:  - adverse reactions to medications - risk of intubation if required - damage to eyes, teeth, lips or other oral mucosa - nerve damage due to positioning  - sore throat or hoarseness - Damage to heart, brain, nerves, lungs, other parts of body or loss of life  Patient voiced understanding.)        Anesthesia Quick Evaluation

## 2019-07-09 NOTE — Op Note (Signed)
[facility] Gastroenterology Patient Name: Andrew Mullins Procedure Date: 07/09/2019 11:11 AM MRN: [id number] Account #: [id number] Date of Birth: 12/12/1955 Admit Type: Outpatient Age: 64 Room: [facility] ENDO ROOM 3 Gender: Male Note Status: Finalized Procedure:             Upper GI endoscopy Indications:           Follow-up of esophageal stricture Providers:             [name] MD, MD Referring MD:          Andres Labrum, MD (Referring MD) Medicines:             Monitored Anesthesia Care Complications:         No immediate complications. Procedure:             Pre-Anesthesia Assessment:                        - Prior to the procedure, a History and Physical was                         performed, and patient medications, allergies and                         sensitivities were reviewed. The patient's tolerance                         of previous anesthesia was reviewed.                        - The risks and benefits of the procedure and the                         sedation options and risks were discussed with the                         patient. All questions were answered and informed                         consent was obtained.                        - ASA Grade Assessment: II - A patient with mild                         systemic disease.                        After obtaining informed consent, the endoscope was                         passed under direct vision. Throughout the procedure,                         the patient's blood pressure, pulse, and oxygen                         saturations were monitored continuously. The Endoscope                         was introduced through the  mouth, and advanced to the                         third part of duodenum. The upper GI endoscopy was                         accomplished without difficulty. The patient tolerated                         the procedure well. Findings:      The examined duodenum was  normal.      The stomach was normal.      The cardia and gastric fundus were normal on retroflexion.      One benign-appearing, intrinsic moderate (circumferential scarring or       stenosis; an endoscope may pass) stenosis was found in the upper third       of the esophagus. This stenosis measured 1.2 cm (inner diameter) x less       than one cm (in length). The stenosis was traversed. A TTS dilator was       passed through the scope. Dilation with a 12-13.5-15 mm balloon dilator       was performed to 15 mm. The dilation site was examined and showed       moderate mucosal disruption. Impression:            - Normal examined duodenum.                        - Normal stomach.                        - Benign-appearing esophageal stenosis. Dilated.                        - No specimens collected. Recommendation:        - Discharge patient to home (with escort).                        - Resume previous diet.                        - Continue present medications.                        - Repeat upper endoscopy in 2 weeks for retreatment. Procedure Code(s):     --- Professional ---                        [phone], Esophagogastroduodenoscopy, flexible,                         transoral; with transendoscopic balloon dilation of                         esophagus (less than 30 mm diameter) Diagnosis Code(s):     --- Professional ---                        K22.2, Esophageal obstruction CPT copyright 2019 American Medical Association. All rights reserved. The codes documented in this report are preliminary and upon coder review may  be revised to meet current compliance requirements. Bailey Mech  Vicente Males, MD [name] MD, MD 07/09/2019 11:23:35 AM This report has been signed electronically. Number of Addenda: 0 Note Initiated On: 07/09/2019 11:11 AM Estimated Blood Loss:  Estimated blood loss: none.      [facility]

## 2019-07-09 NOTE — H&P (Signed)
[name], MD [address] [place], [place], [place], [address] [address] [place], [place], [place], [address] Phone: [phone]  Fax: [phone]  Primary Care Physician:  Baxter Hire, MD   Pre-Procedure History & Physical: HPI:  Andrew Mullins is a 64 y.o. male is here for an endoscopy    Past Medical History:  Diagnosis Date  . 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 GERD (gastroesophageal reflux disease)   . Hypertension   . Prostate cancer ([place])   . Stroke ([place]) 2020   no wealness    Past Surgical History:  Procedure Laterality Date  . broken bones repair    . ESOPHAGOGASTRODUODENOSCOPY (EGD) WITH PROPOFOL N/A 02/18/2018   Procedure: ESOPHAGOGASTRODUODENOSCOPY (EGD) WITH PROPOFOL;  Surgeon: [name], MD;  Location: [facility] ENDOSCOPY;  Service: Gastroenterology;  Laterality: N/A;  . ESOPHAGOGASTRODUODENOSCOPY (EGD) WITH PROPOFOL N/A 04/01/2018   Procedure: ESOPHAGOGASTRODUODENOSCOPY (EGD) WITH PROPOFOL;  Surgeon: [name], MD;  Location: [facility] ENDOSCOPY;  Service: Gastroenterology;  Laterality: N/A;  . ESOPHAGOGASTRODUODENOSCOPY (EGD) WITH PROPOFOL N/A 12/02/2018   Procedure: ESOPHAGOGASTRODUODENOSCOPY (EGD) WITH PROPOFOL with Dilation;  Surgeon: [name], MD;  Location: [facility] ENDOSCOPY;  Service: Gastroenterology;  Laterality: N/A;  . ESOPHAGOGASTRODUODENOSCOPY (EGD) WITH PROPOFOL N/A 05/19/2019   Procedure: ESOPHAGOGASTRODUODENOSCOPY (EGD) WITH PROPOFOL;  Surgeon: [name], MD;  Location: [facility] ENDOSCOPY;  Service: Gastroenterology;  Laterality: N/A;  . ESOPHAGOGASTRODUODENOSCOPY (EGD) WITH PROPOFOL N/A 05/18/2019   Procedure: ESOPHAGOGASTRODUODENOSCOPY (EGD) WITH PROPOFOL with Dilation;  Surgeon: [name], MD;  Location: [facility] ENDOSCOPY;  Service: Gastroenterology;  Laterality: N/A;  . ESOPHAGOGASTRODUODENOSCOPY (EGD) WITH PROPOFOL N/A 06/25/2019   Procedure: ESOPHAGOGASTRODUODENOSCOPY (EGD) WITH PROPOFOL  with Dilation;  Surgeon: [name], MD;   Location: [facility] ENDOSCOPY;  Service: Gastroenterology;  Laterality: N/A;  . EYE SURGERY     retinal detatchment  . FRACTURE SURGERY    . radical prostate    . TONSILLECTOMY      Prior to Admission medications   Medication Sig Start Date End Date Taking? Authorizing Provider  metoprolol tartrate (LOPRESSOR) 25 MG tablet Take 25 mg by mouth 2 (two) times daily.   Yes [provider]  aspirin EC 325 MG EC tablet Take 1 tablet (325 mg total) by mouth daily. 09/19/18   Fritzi Mandes, MD  atorvastatin (LIPITOR) 40 MG tablet Take 1 tablet (40 mg total) by mouth daily at 6 PM. 09/18/18   Fritzi Mandes, MD  famciclovir (FAMVIR) 250 MG tablet Take 250 mg by mouth 2 (two) times daily.    [provider]  losartan (COZAAR) 50 MG tablet Take 50 mg by mouth daily. 08/20/18   [provider]  omeprazole (PRILOSEC) 20 MG capsule Take 20 mg by mouth daily.    [provider]    Allergies as of 06/30/2019  . (No Known Allergies)    History reviewed. No pertinent family history.  Social History   Socioeconomic History  . Marital status: Married    Spouse name: Not on file  . Number of children: Not on file  . Years of education: Not on file  . Highest education level: Not on file  Occupational History  . Not on file  Tobacco Use  . Smoking status: Former Research scientist (life sciences)  . Smokeless tobacco: Never Used  Vaping Use  . Vaping Use: Never used  Substance and Sexual Activity  . Alcohol use: Never  . Drug use: Never  . Sexual activity: Not on file  Other Topics Concern  .  Not on file  Social History Narrative   Lives at home with wife   Social Determinants of Health   Financial Resource Strain:   . Difficulty of Paying Living Expenses:   Food Insecurity:   . Worried About Charity fundraiser in the Last Year:   . Arboriculturist in the Last Year:   Transportation Needs:   . Film/video editor (Medical):   Marland Kitchen Lack of Transportation (Non-Medical):   Physical  Activity:   . Days of Exercise per Week:   . Minutes of Exercise per Session:   Stress:   . Feeling of Stress :   Social Connections:   . Frequency of Communication with Friends and Family:   . Frequency of Social Gatherings with Friends and Family:   . Attends Religious Services:   . Active Member of Clubs or Organizations:   . Attends Archivist Meetings:   Marland Kitchen Marital Status:   Intimate Partner Violence:   . Fear of Current or Ex-Partner:   . Emotionally Abused:   Marland Kitchen Physically Abused:   . Sexually Abused:     Review of Systems: See HPI, otherwise negative ROS  Physical Exam: BP (!) 144/84   Pulse (!) 55   Temp (!) 97.2 F (36.2 C) (Temporal)   Resp 18   Ht 6' (1.829 m)   Wt 95.5 kg   SpO2 99%   BMI 28.54 kg/m  General:   Alert,  pleasant and cooperative in NAD Head:  Normocephalic and atraumatic. Neck:  Supple; no masses or thyromegaly. Lungs:  Clear throughout to auscultation, normal respiratory effort.    Heart:  +S1, +S2, Regular rate and rhythm, No edema. Abdomen:  Soft, nontender and nondistended. Normal bowel sounds, without guarding, and without rebound.   Neurologic:  Alert and  oriented x4;  grossly normal neurologically.  Impression/Plan: Andrew Mullins is here for an endoscopy and dilation  to be performed for  evaluation of esophageal stricture    Risks, benefits, limitations, and alternatives regarding endoscopy have been reviewed with the patient.  Questions have been answered.  All parties agreeable.   [name], MD  07/09/2019, 11:04 AM

## 2019-07-09 NOTE — Transfer of Care (Signed)
Immediate Anesthesia Transfer of Care Note  Patient: Andrew Mullins  Procedure(s) Performed: ESOPHAGOGASTRODUODENOSCOPY (EGD) WITH PROPOFOL with Dilation (N/A )  Patient Location: PACU  Anesthesia Type:General  Level of Consciousness: awake and sedated  Airway & Oxygen Therapy: Patient Spontanous Breathing and Patient connected to nasal cannula oxygen  Post-op Assessment: Report given to RN and Post -op Vital signs reviewed and stable  Post vital signs: Reviewed and stable  Last Vitals:  Vitals Value Taken Time  BP    Temp    Pulse    Resp    SpO2      Last Pain:  Vitals:   07/09/19 0955  TempSrc: Temporal         Complications: No complications documented.

## 2019-07-09 NOTE — Anesthesia Postprocedure Evaluation (Signed)
Anesthesia Post Note  Patient: RICH PAPROCKI  Procedure(s) Performed: ESOPHAGOGASTRODUODENOSCOPY (EGD) WITH PROPOFOL with Dilation (N/A )  Patient location during evaluation: Endoscopy Anesthesia Type: General Level of consciousness: awake and alert Pain management: pain level controlled Vital Signs Assessment: post-procedure vital signs reviewed and stable Respiratory status: spontaneous breathing, nonlabored ventilation, respiratory function stable and patient connected to nasal cannula oxygen Cardiovascular status: blood pressure returned to baseline and stable Postop Assessment: no apparent nausea or vomiting Anesthetic complications: no   No complications documented.   Last Vitals:  Vitals:   07/09/19 1144 07/09/19 1200  BP: (!) 153/90 (!) 149/86  Pulse: (!) 54 (!) 50  Resp: 15 16  Temp:    SpO2: 100% 100%    Last Pain:  Vitals:   07/09/19 1200  TempSrc:   PainSc: 0-No pain                 Precious Haws Shontez Sermon

## 2019-07-09 NOTE — Anesthesia Procedure Notes (Signed)
Performed by: Cook-Martin, Keevan Wolz Pre-anesthesia Checklist: Patient identified, Emergency Drugs available, Suction available, Patient being monitored and Timeout performed Patient Re-evaluated:Patient Re-evaluated prior to induction Oxygen Delivery Method: Nasal cannula Preoxygenation: Pre-oxygenation with 100% oxygen Induction Type: IV induction Airway Equipment and Method: Bite block Placement Confirmation: positive ETCO2 and CO2 detector       

## 2019-07-10 ENCOUNTER — Encounter: Payer: Self-pay | Admitting: Gastroenterology

## 2019-08-03 ENCOUNTER — Other Ambulatory Visit: Payer: Self-pay

## 2019-08-03 DIAGNOSIS — R1310 Dysphagia, unspecified: Secondary | ICD-10-CM

## 2019-08-03 DIAGNOSIS — K222 Esophageal obstruction: Secondary | ICD-10-CM

## 2019-08-07 ENCOUNTER — Other Ambulatory Visit: Payer: Self-pay

## 2019-08-07 ENCOUNTER — Other Ambulatory Visit
Admission: RE | Admit: 2019-08-07 | Discharge: 2019-08-07 | Disposition: A | Discharge status: Home / Self Care | Payer: BC Managed Care – PPO | Source: Ambulatory Visit | Attending: Gastroenterology | Admitting: Gastroenterology

## 2019-08-07 DIAGNOSIS — Z01812 Encounter for preprocedural laboratory examination: Secondary | ICD-10-CM | POA: Insufficient documentation

## 2019-08-07 DIAGNOSIS — Z20822 Contact with and (suspected) exposure to covid-19: Secondary | ICD-10-CM | POA: Diagnosis not present

## 2019-08-08 LAB — SARS CORONAVIRUS 2 (TAT 6-24 HRS): SARS Coronavirus 2: NEGATIVE

## 2019-08-11 ENCOUNTER — Ambulatory Visit: Payer: BC Managed Care – PPO | Admitting: Anesthesiology

## 2019-08-11 ENCOUNTER — Encounter
Admission: RE | Disposition: A | Discharge status: Home / Self Care | Payer: Self-pay | Source: Home / Self Care | Attending: Gastroenterology

## 2019-08-11 ENCOUNTER — Ambulatory Visit
Admission: RE | Admit: 2019-08-11 | Discharge: 2019-08-11 | Disposition: A | Discharge status: Home / Self Care | Payer: BC Managed Care – PPO | Attending: Gastroenterology | Admitting: Gastroenterology

## 2019-08-11 ENCOUNTER — Other Ambulatory Visit: Payer: Self-pay

## 2019-08-11 ENCOUNTER — Encounter: Payer: Self-pay | Admitting: Gastroenterology

## 2019-08-11 DIAGNOSIS — Z87891 Personal history of nicotine dependence: Secondary | ICD-10-CM | POA: Insufficient documentation

## 2019-08-11 DIAGNOSIS — Z8546 Personal history of malignant neoplasm of prostate: Secondary | ICD-10-CM | POA: Insufficient documentation

## 2019-08-11 DIAGNOSIS — I10 Essential (primary) hypertension: Secondary | ICD-10-CM | POA: Diagnosis not present

## 2019-08-11 DIAGNOSIS — R1310 Dysphagia, unspecified: Secondary | ICD-10-CM | POA: Diagnosis present

## 2019-08-11 DIAGNOSIS — Z7982 Long term (current) use of aspirin: Secondary | ICD-10-CM | POA: Insufficient documentation

## 2019-08-11 DIAGNOSIS — K222 Esophageal obstruction: Secondary | ICD-10-CM | POA: Diagnosis not present

## 2019-08-11 DIAGNOSIS — Z79899 Other long term (current) drug therapy: Secondary | ICD-10-CM | POA: Diagnosis not present

## 2019-08-11 DIAGNOSIS — Z8673 Personal history of transient ischemic attack (TIA), and cerebral infarction without residual deficits: Secondary | ICD-10-CM | POA: Diagnosis not present

## 2019-08-11 DIAGNOSIS — Z8501 Personal history of malignant neoplasm of esophagus: Secondary | ICD-10-CM | POA: Insufficient documentation

## 2019-08-11 DIAGNOSIS — K219 Gastro-esophageal reflux disease without esophagitis: Secondary | ICD-10-CM | POA: Diagnosis not present

## 2019-08-11 HISTORY — PX: ESOPHAGOGASTRODUODENOSCOPY (EGD) WITH PROPOFOL: SHX5813

## 2019-08-11 SURGERY — ESOPHAGOGASTRODUODENOSCOPY (EGD) WITH PROPOFOL
Anesthesia: General

## 2019-08-11 MED ORDER — PROPOFOL 500 MG/50ML IV EMUL
INTRAVENOUS | Status: DC | PRN
  Administered 2019-08-11: 175 ug/kg/min via INTRAVENOUS

## 2019-08-11 MED ORDER — SODIUM CHLORIDE 0.9 % IV SOLN: INTRAVENOUS | Status: DC | PRN

## 2019-08-11 MED ORDER — LIDOCAINE HCL (PF) 2 % IJ SOLN
INTRAMUSCULAR | Status: DC | PRN
  Administered 2019-08-11: 100 mg via INTRADERMAL

## 2019-08-11 MED ORDER — SODIUM CHLORIDE 0.9 % IV SOLN
INTRAVENOUS | Status: DC
  Administered 2019-08-11: 1000 mL via INTRAVENOUS

## 2019-08-11 MED ORDER — PROPOFOL 10 MG/ML IV BOLUS
INTRAVENOUS | Status: DC | PRN
  Administered 2019-08-11: 70 mg via INTRAVENOUS

## 2019-08-11 NOTE — Anesthesia Procedure Notes (Signed)
Date/Time: 08/11/2019 10:28 AM Performed by: Nelda Marseille, CRNA Pre-anesthesia Checklist: Patient identified, Emergency Drugs available, Suction available, Patient being monitored and Timeout performed Oxygen Delivery Method: Nasal cannula

## 2019-08-11 NOTE — Addendum Note (Signed)
Addendum  created 08/11/19 1312 by Naythen Heikkila, CRNA   Intraprocedure Event edited    

## 2019-08-11 NOTE — Anesthesia Preprocedure Evaluation (Signed)
Anesthesia Evaluation  Patient identified by MRN, date of birth, ID band Patient awake    Reviewed: Allergy & Precautions, NPO status , Patient's Chart, lab work & pertinent test results, reviewed documented beta blocker date and time   History of Anesthesia Complications Negative for: history of anesthetic complications  Airway Mallampati: II       Dental   Pulmonary neg sleep apnea, neg COPD, Not current smoker, former smoker,           Cardiovascular hypertension, Pt. on medications and Pt. on home beta blockers (-) Past MI and (-) CHF (-) dysrhythmias (-) Valvular Problems/Murmurs     Neuro/Psych neg Seizures CVA (R sided weakness), No Residual Symptoms    GI/Hepatic Neg liver ROS, GERD  Medicated and Controlled,  Endo/Other  negative endocrine ROSneg diabetes  Renal/GU negative Renal ROS     Musculoskeletal   Abdominal   Peds  Hematology   Anesthesia Other Findings   Reproductive/Obstetrics                             Anesthesia Physical Anesthesia Plan  ASA: III  Anesthesia Plan: General   Post-op Pain Management:    Induction: Intravenous  PONV Risk Score and Plan: 2 and Propofol infusion, TIVA and Treatment may vary due to age or medical condition  Airway Management Planned: Nasal Cannula  Additional Equipment:   Intra-op Plan:   Post-operative Plan:   Informed Consent: I have reviewed the patients History and Physical, chart, labs and discussed the procedure including the risks, benefits and alternatives for the proposed anesthesia with the patient or authorized representative who has indicated his/her understanding and acceptance.       Plan Discussed with:   Anesthesia Plan Comments:         Anesthesia Quick Evaluation

## 2019-08-11 NOTE — Anesthesia Postprocedure Evaluation (Signed)
Anesthesia Post Note  Patient: Andrew Mullins  Procedure(s) Performed: ESOPHAGOGASTRODUODENOSCOPY (EGD) WITH PROPOFOL with Dilation (N/A )  Patient location during evaluation: Endoscopy Anesthesia Type: General Level of consciousness: awake and alert Pain management: pain level controlled Vital Signs Assessment: post-procedure vital signs reviewed and stable Respiratory status: spontaneous breathing and respiratory function stable Cardiovascular status: stable Anesthetic complications: no   No complications documented.   Last Vitals:  Vitals:   08/11/19 1050 08/11/19 1100  BP: (!) 136/88 (!) 147/90  Pulse: 58 53  Resp: 19 (!) 11  Temp:    SpO2: 99% 97%    Last Pain:  Vitals:   08/11/19 1040  TempSrc: Temporal  PainSc:                  Eitan Doubleday K

## 2019-08-11 NOTE — Transfer of Care (Signed)
Immediate Anesthesia Transfer of Care Note  Patient: Andrew Mullins  Procedure(s) Performed: ESOPHAGOGASTRODUODENOSCOPY (EGD) WITH PROPOFOL with Dilation (N/A )  Patient Location: PACU  Anesthesia Type:General  Level of Consciousness: awake, alert  and oriented  Airway & Oxygen Therapy: Patient Spontanous Breathing and Patient connected to nasal cannula oxygen  Post-op Assessment: Report given to RN and Post -op Vital signs reviewed and stable  Post vital signs: Reviewed and stable  Last Vitals:  Vitals Value Taken Time  BP    Temp    Pulse    Resp    SpO2      Last Pain:  Vitals:   08/11/19 1001  TempSrc: Temporal  PainSc: 0-No pain         Complications: No complications documented.

## 2019-08-11 NOTE — Op Note (Signed)
[facility] Gastroenterology Patient Name: Andrew Mullins Procedure Date: 08/11/2019 10:18 AM MRN: [id number] Account #: [id number] Date of Birth: Oct 13, 1955 Admit Type: Outpatient Age: 64 Room: [facility] ENDO ROOM 4 Gender: Male Note Status: Finalized Procedure:             Upper GI endoscopy Indications:           Dysphagia Providers:             [name] MD, MD Referring MD:          No Local Md, MD (Referring MD) Medicines:             Monitored Anesthesia Care Complications:         No immediate complications. Procedure:             Pre-Anesthesia Assessment:                        - Prior to the procedure, a History and Physical was                         performed, and patient medications, allergies and                         sensitivities were reviewed. The patient's tolerance                         of previous anesthesia was reviewed.                        - The risks and benefits of the procedure and the                         sedation options and risks were discussed with the                         patient. All questions were answered and informed                         consent was obtained.                        - ASA Grade Assessment: II - A patient with mild                         systemic disease.                        After obtaining informed consent, the endoscope was                         passed under direct vision. Throughout the procedure,                         the patient's blood pressure, pulse, and oxygen                         saturations were monitored continuously. The Endoscope                         was introduced through the mouth, and advanced  to the                         third part of duodenum. The upper GI endoscopy was                         accomplished with ease. The patient tolerated the                         procedure well. Findings:      One benign-appearing, intrinsic moderate stenosis was found in the  upper       third of the esophagus. This stenosis measured 1.4 cm (inner diameter) x       1 cm (in length). The stenosis was traversed. A TTS dilator was passed       through the scope. Dilation with a 12-13.5-15 mm balloon and a       15-16.5-18 mm balloon dilator was performed to 16.5 mm. The dilation       site was examined and showed moderate mucosal disruption.      The cardia and gastric fundus were normal on retroflexion.      The stomach was normal.      The examined duodenum was normal. Impression:            - Benign-appearing esophageal stenosis. Dilated.                        - Normal stomach.                        - Normal examined duodenum.                        - No specimens collected. Recommendation:        - Discharge patient to home (with escort).                        - Resume previous diet.                        - Continue present medications.                        - Repeat upper endoscopy in 3 weeks for retreatment. Procedure Code(s):     --- Professional ---                        [id number], Esophagogastroduodenoscopy, flexible,                         transoral; with transendoscopic balloon dilation of                         esophagus (less than 30 mm diameter) Diagnosis Code(s):     --- Professional ---                        K22.2, Esophageal obstruction                        R13.10, Dysphagia, unspecified CPT copyright 2019 American Medical Association. All rights reserved. The codes documented in this report are preliminary and upon  coder review may  be revised to meet current compliance requirements. [name], MD [name] MD, MD 08/11/2019 10:37:09 AM This report has been signed electronically. Number of Addenda: 0 Note Initiated On: 08/11/2019 10:18 AM Estimated Blood Loss:  Estimated blood loss: none.      [facility]

## 2019-08-11 NOTE — H&P (Signed)
[name], MD [address] [place], [place], [place], [address] [address] [place], [place], [place], [address] Phone: [phone]  Fax: [phone]  Primary Care Physician:  Baxter Hire, MD   Pre-Procedure History & Physical: HPI:  Andrew Mullins is a 64 y.o. male is here for an endoscopy    Past Medical History:  Diagnosis Date  . 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 GERD (gastroesophageal reflux disease)   . Hypertension   . Prostate cancer ([place])   . Stroke ([place]) 2020   no wealness    Past Surgical History:  Procedure Laterality Date  . broken bones repair    . ESOPHAGOGASTRODUODENOSCOPY (EGD) WITH PROPOFOL N/A 02/18/2018   Procedure: ESOPHAGOGASTRODUODENOSCOPY (EGD) WITH PROPOFOL;  Surgeon: [name], MD;  Location: [facility] ENDOSCOPY;  Service: Gastroenterology;  Laterality: N/A;  . ESOPHAGOGASTRODUODENOSCOPY (EGD) WITH PROPOFOL N/A 04/01/2018   Procedure: ESOPHAGOGASTRODUODENOSCOPY (EGD) WITH PROPOFOL;  Surgeon: [name], MD;  Location[facility] ENDOSCOPY;  Service: Gastroenterology;  Laterality: N/A;  . ESOPHAGOGASTRODUODENOSCOPY (EGD) WITH PROPOFOL N/A 12/02/2018   Procedure: ESOPHAGOGASTRODUODENOSCOPY (EGD) WITH PROPOFOL with Dilation;  Surgeon: [name], MD;  Location: [facility] ENDOSCOPY;  Service: Gastroenterology;  Laterality: N/A;  . ESOPHAGOGASTRODUODENOSCOPY (EGD) WITH PROPOFOL N/A 05/19/2019   Procedure: ESOPHAGOGASTRODUODENOSCOPY (EGD) WITH PROPOFOL;  Surgeon: [name], MD;  Location: [facility] ENDOSCOPY;  Service: Gastroenterology;  Laterality: N/A;  . ESOPHAGOGASTRODUODENOSCOPY (EGD) WITH PROPOFOL N/A 05/18/2019   Procedure: ESOPHAGOGASTRODUODENOSCOPY (EGD) WITH PROPOFOL with Dilation;  Surgeon: [name], MD;  Location: [facility] ENDOSCOPY;  Service: Gastroenterology;  Laterality: N/A;  . ESOPHAGOGASTRODUODENOSCOPY (EGD) WITH PROPOFOL N/A 06/25/2019   Procedure: ESOPHAGOGASTRODUODENOSCOPY (EGD) WITH PROPOFOL  with Dilation;  Surgeon: [name], MD;   Location: [facility] ENDOSCOPY;  Service: Gastroenterology;  Laterality: N/A;  . ESOPHAGOGASTRODUODENOSCOPY (EGD) WITH PROPOFOL N/A 07/09/2019   Procedure: ESOPHAGOGASTRODUODENOSCOPY (EGD) WITH PROPOFOL with Dilation;  Surgeon: [name], MD;  Location: [facility] ENDOSCOPY;  Service: Gastroenterology;  Laterality: N/A;  Pt requests early morning  . EYE SURGERY     retinal detatchment  . FRACTURE SURGERY    . radical prostate    . TONSILLECTOMY      Prior to Admission medications   Medication Sig Start Date End Date Taking? Authorizing Provider  aspirin EC 325 MG EC tablet Take 1 tablet (325 mg total) by mouth daily. 09/19/18  Yes Fritzi Mandes, MD  atorvastatin (LIPITOR) 40 MG tablet Take 1 tablet (40 mg total) by mouth daily at 6 PM. 09/18/18  Yes Fritzi Mandes, MD  famciclovir (FAMVIR) 250 MG tablet Take 250 mg by mouth 2 (two) times daily.   Yes [provider]  losartan (COZAAR) 50 MG tablet Take 50 mg by mouth daily. 08/20/18  Yes [provider]  metoprolol tartrate (LOPRESSOR) 25 MG tablet Take 25 mg by mouth 2 (two) times daily.   Yes [provider]  omeprazole (PRILOSEC) 20 MG capsule Take 20 mg by mouth daily.   Yes [provider]    Allergies as of 08/04/2019  . (No Known Allergies)    History reviewed. No pertinent family history.  Social History   Socioeconomic History  . Marital status: Married    Spouse name: Not on file  . Number of children: Not on file  . Years of education: Not on file  . Highest education level: Not on file  Occupational History  . Not on file  Tobacco Use  . Smoking status: Former Research scientist (life sciences)  . Smokeless tobacco: Never  Used  Vaping Use  . Vaping Use: Never used  Substance and Sexual Activity  . Alcohol use: Never  . Drug use: Never  . Sexual activity: Not on file  Other Topics Concern  . Not on file  Social History Narrative   Lives at home with wife   Social Determinants of Health   Financial Resource Strain:     . Difficulty of Paying Living Expenses:   Food Insecurity:   . Worried About Charity fundraiser in the Last Year:   . Arboriculturist in the Last Year:   Transportation Needs:   . Film/video editor (Medical):   Marland Kitchen Lack of Transportation (Non-Medical):   Physical Activity:   . Days of Exercise per Week:   . Minutes of Exercise per Session:   Stress:   . Feeling of Stress :   Social Connections:   . Frequency of Communication with Friends and Family:   . Frequency of Social Gatherings with Friends and Family:   . Attends Religious Services:   . Active Member of Clubs or Organizations:   . Attends Archivist Meetings:   Marland Kitchen Marital Status:   Intimate Partner Violence:   . Fear of Current or Ex-Partner:   . Emotionally Abused:   Marland Kitchen Physically Abused:   . Sexually Abused:     Review of Systems: See HPI, otherwise negative ROS  Physical Exam: BP (!) 150/92   Pulse 62   Temp (!) 97.2 F (36.2 C) (Temporal)   Resp 15   Ht 6' (1.829 m)   Wt (!) 95.3 kg   SpO2 99%   BMI 28.48 kg/m  General:   Alert,  pleasant and cooperative in NAD Head:  Normocephalic and atraumatic. Neck:  Supple; no masses or thyromegaly. Lungs:  Clear throughout to auscultation, normal respiratory effort.    Heart:  +S1, +S2, Regular rate and rhythm, No edema. Abdomen:  Soft, nontender and nondistended. Normal bowel sounds, without guarding, and without rebound.   Neurologic:  Alert and  oriented x4;  grossly normal neurologically.  Impression/Plan: Andrew Mullins is here for an endoscopy  to be performed for  evaluation of dysphagia and possible dilation     Risks, benefits, limitations, and alternatives regarding endoscopy have been reviewed with the patient.  Questions have been answered.  All parties agreeable.   [name], MD  08/11/2019, 10:03 AM

## 2019-08-12 ENCOUNTER — Encounter: Payer: Self-pay | Admitting: Gastroenterology

## 2019-09-04 ENCOUNTER — Other Ambulatory Visit: Payer: Self-pay

## 2019-09-04 DIAGNOSIS — K222 Esophageal obstruction: Secondary | ICD-10-CM

## 2019-09-04 DIAGNOSIS — R1310 Dysphagia, unspecified: Secondary | ICD-10-CM

## 2019-09-09 ENCOUNTER — Other Ambulatory Visit
Admission: RE | Admit: 2019-09-09 | Discharge: 2019-09-09 | Disposition: A | Discharge status: Home / Self Care | Payer: BC Managed Care – PPO | Source: Ambulatory Visit | Attending: Gastroenterology | Admitting: Gastroenterology

## 2019-09-09 ENCOUNTER — Other Ambulatory Visit: Payer: Self-pay

## 2019-09-09 DIAGNOSIS — Z20822 Contact with and (suspected) exposure to covid-19: Secondary | ICD-10-CM | POA: Insufficient documentation

## 2019-09-09 DIAGNOSIS — Z01812 Encounter for preprocedural laboratory examination: Secondary | ICD-10-CM | POA: Diagnosis present

## 2019-09-10 LAB — SARS CORONAVIRUS 2 (TAT 6-24 HRS): SARS Coronavirus 2: NEGATIVE

## 2019-09-11 ENCOUNTER — Other Ambulatory Visit: Payer: Self-pay

## 2019-09-11 ENCOUNTER — Ambulatory Visit
Admission: RE | Admit: 2019-09-11 | Discharge: 2019-09-11 | Disposition: A | Discharge status: Home / Self Care | Payer: BC Managed Care – PPO | Attending: Gastroenterology | Admitting: Gastroenterology

## 2019-09-11 ENCOUNTER — Ambulatory Visit: Payer: BC Managed Care – PPO | Admitting: Certified Registered"

## 2019-09-11 ENCOUNTER — Encounter
Admission: RE | Disposition: A | Discharge status: Home / Self Care | Payer: Self-pay | Source: Home / Self Care | Attending: Gastroenterology

## 2019-09-11 DIAGNOSIS — Z8673 Personal history of transient ischemic attack (TIA), and cerebral infarction without residual deficits: Secondary | ICD-10-CM | POA: Insufficient documentation

## 2019-09-11 DIAGNOSIS — K219 Gastro-esophageal reflux disease without esophagitis: Secondary | ICD-10-CM | POA: Diagnosis not present

## 2019-09-11 DIAGNOSIS — Z87891 Personal history of nicotine dependence: Secondary | ICD-10-CM | POA: Diagnosis not present

## 2019-09-11 DIAGNOSIS — Z8546 Personal history of malignant neoplasm of prostate: Secondary | ICD-10-CM | POA: Diagnosis not present

## 2019-09-11 DIAGNOSIS — I10 Essential (primary) hypertension: Secondary | ICD-10-CM | POA: Insufficient documentation

## 2019-09-11 DIAGNOSIS — Z7982 Long term (current) use of aspirin: Secondary | ICD-10-CM | POA: Insufficient documentation

## 2019-09-11 DIAGNOSIS — Z79899 Other long term (current) drug therapy: Secondary | ICD-10-CM | POA: Insufficient documentation

## 2019-09-11 DIAGNOSIS — R1310 Dysphagia, unspecified: Secondary | ICD-10-CM | POA: Diagnosis present

## 2019-09-11 DIAGNOSIS — K222 Esophageal obstruction: Secondary | ICD-10-CM | POA: Insufficient documentation

## 2019-09-11 HISTORY — PX: ESOPHAGOGASTRODUODENOSCOPY (EGD) WITH PROPOFOL: SHX5813

## 2019-09-11 SURGERY — ESOPHAGOGASTRODUODENOSCOPY (EGD) WITH PROPOFOL
Anesthesia: General

## 2019-09-11 MED ORDER — PROPOFOL 10 MG/ML IV BOLUS
INTRAVENOUS | Status: DC | PRN
  Administered 2019-09-11: 70 mg via INTRAVENOUS
  Administered 2019-09-11 (×2): 30 mg via INTRAVENOUS

## 2019-09-11 MED ORDER — LIDOCAINE HCL (CARDIAC) PF 100 MG/5ML IV SOSY
PREFILLED_SYRINGE | INTRAVENOUS | Status: DC | PRN
  Administered 2019-09-11: 40 mg via INTRAVENOUS

## 2019-09-11 MED ORDER — LIDOCAINE HCL (PF) 2 % IJ SOLN
INTRAMUSCULAR | Status: AC
  Filled 2019-09-11: qty 5

## 2019-09-11 MED ORDER — SODIUM CHLORIDE 0.9 % IV SOLN: INTRAVENOUS | Status: DC

## 2019-09-11 MED ORDER — PROPOFOL 500 MG/50ML IV EMUL
INTRAVENOUS | Status: AC
  Filled 2019-09-11: qty 50

## 2019-09-11 NOTE — H&P (Signed)
[name], MD [address], [place], [place], [place], [address] [address] [place], [place], [place], [address] Phone: [phone]  Fax: [phone]  Primary Care Physician:  Baxter Hire, MD   Pre-Procedure History & Physical: HPI:  Andrew Mullins is a 64 y.o. male is here for an endoscopy    Past Medical History:  Diagnosis Date  . 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 GERD (gastroesophageal reflux disease)   . Hypertension   . Prostate cancer ([place])   . Stroke ([place]) 2020   no wealness    Past Surgical History:  Procedure Laterality Date  . broken bones repair    . ESOPHAGOGASTRODUODENOSCOPY (EGD) WITH PROPOFOL N/A 02/18/2018   Procedure: ESOPHAGOGASTRODUODENOSCOPY (EGD) WITH PROPOFOL;  Surgeon: [name], MD;  Location: [facility] ENDOSCOPY;  Service: Gastroenterology;  Laterality: N/A;  . ESOPHAGOGASTRODUODENOSCOPY (EGD) WITH PROPOFOL N/A 04/01/2018   Procedure: ESOPHAGOGASTRODUODENOSCOPY (EGD) WITH PROPOFOL;  Surgeon: [name], MD;  Location: [facility] ENDOSCOPY;  Service: Gastroenterology;  Laterality: N/A;  . ESOPHAGOGASTRODUODENOSCOPY (EGD) WITH PROPOFOL N/A 12/02/2018   Procedure: ESOPHAGOGASTRODUODENOSCOPY (EGD) WITH PROPOFOL with Dilation;  Surgeon: [name], MD;  Location: [facility] ENDOSCOPY;  Service: Gastroenterology;  Laterality: N/A;  . ESOPHAGOGASTRODUODENOSCOPY (EGD) WITH PROPOFOL N/A 05/19/2019   Procedure: ESOPHAGOGASTRODUODENOSCOPY (EGD) WITH PROPOFOL;  Surgeon: [name], MD;  Location: [facility] ENDOSCOPY;  Service: Gastroenterology;  Laterality: N/A;  . ESOPHAGOGASTRODUODENOSCOPY (EGD) WITH PROPOFOL N/A 05/18/2019   Procedure: ESOPHAGOGASTRODUODENOSCOPY (EGD) WITH PROPOFOL with Dilation;  Surgeon: [name], MD;  Location: [facility] ENDOSCOPY;  Service: Gastroenterology;  Laterality: N/A;  . ESOPHAGOGASTRODUODENOSCOPY (EGD) WITH PROPOFOL N/A 06/25/2019   Procedure: ESOPHAGOGASTRODUODENOSCOPY (EGD) WITH PROPOFOL  with Dilation;  Surgeon: [name], MD;   Location: [facility] ENDOSCOPY;  Service: Gastroenterology;  Laterality: N/A;  . ESOPHAGOGASTRODUODENOSCOPY (EGD) WITH PROPOFOL N/A 07/09/2019   Procedure: ESOPHAGOGASTRODUODENOSCOPY (EGD) WITH PROPOFOL with Dilation;  Surgeon: [name], MD;  Location: [facility] ENDOSCOPY;  Service: Gastroenterology;  Laterality: N/A;  Pt requests early morning  . ESOPHAGOGASTRODUODENOSCOPY (EGD) WITH PROPOFOL N/A 08/11/2019   Procedure: ESOPHAGOGASTRODUODENOSCOPY (EGD) WITH PROPOFOL with Dilation;  Surgeon: [name], MD;  Location: [facility] ENDOSCOPY;  Service: Gastroenterology;  Laterality: N/A;  . EYE SURGERY     retinal detatchment  . FRACTURE SURGERY    . radical prostate    . TONSILLECTOMY      Prior to Admission medications   Medication Sig Start Date End Date Taking? Authorizing Provider  aspirin EC 325 MG EC tablet Take 1 tablet (325 mg total) by mouth daily. 09/19/18  Yes Fritzi Mandes, MD  atorvastatin (LIPITOR) 40 MG tablet Take 1 tablet (40 mg total) by mouth daily at 6 PM. 09/18/18  Yes Fritzi Mandes, MD  famciclovir (FAMVIR) 250 MG tablet Take 250 mg by mouth 2 (two) times daily.   Yes [provider]  losartan (COZAAR) 50 MG tablet Take 50 mg by mouth daily. 08/20/18  Yes [provider]  metoprolol tartrate (LOPRESSOR) 25 MG tablet Take 25 mg by mouth 2 (two) times daily.   Yes [provider]  omeprazole (PRILOSEC) 20 MG capsule Take 20 mg by mouth daily.   Yes [provider]    Allergies as of 09/04/2019  . (No Known Allergies)    No family history on file.  Social History   Socioeconomic History  . Marital status: Married    Spouse name: Not on file  . Number of children: Not on file  . Years of education: Not on  file  . Highest education level: Not on file  Occupational History  . Not on file  Tobacco Use  . Smoking status: Former Research scientist (life sciences)  . Smokeless tobacco: Never Used  Vaping Use  . Vaping Use: Never used  Substance and Sexual Activity  . Alcohol use:  Never  . Drug use: Never  . Sexual activity: Not on file  Other Topics Concern  . Not on file  Social History Narrative   Lives at home with wife   Social Determinants of Health   Financial Resource Strain:   . Difficulty of Paying Living Expenses: Not on file  Food Insecurity:   . Worried About Charity fundraiser in the Last Year: Not on file  . Ran Out of Food in the Last Year: Not on file  Transportation Needs:   . Lack of Transportation (Medical): Not on file  . Lack of Transportation (Non-Medical): Not on file  Physical Activity:   . Days of Exercise per Week: Not on file  . Minutes of Exercise per Session: Not on file  Stress:   . Feeling of Stress : Not on file  Social Connections:   . Frequency of Communication with Friends and Family: Not on file  . Frequency of Social Gatherings with Friends and Family: Not on file  . Attends Religious Services: Not on file  . Active Member of Clubs or Organizations: Not on file  . Attends Archivist Meetings: Not on file  . Marital Status: Not on file  Intimate Partner Violence:   . Fear of Current or Ex-Partner: Not on file  . Emotionally Abused: Not on file  . Physically Abused: Not on file  . Sexually Abused: Not on file    Review of Systems: See HPI, otherwise negative ROS  Physical Exam: BP (!) 146/88   Pulse (!) 58   Temp 97.6 F (36.4 C) (Temporal)   Resp 18   Ht 6' (1.829 m)   Wt 95.3 kg   SpO2 99%   BMI 28.48 kg/m  General:   Alert,  pleasant and cooperative in NAD Head:  Normocephalic and atraumatic. Neck:  Supple; no masses or thyromegaly. Lungs:  Clear throughout to auscultation, normal respiratory effort.    Heart:  +S1, +S2, Regular rate and rhythm, No edema. Abdomen:  Soft, nontender and nondistended. Normal bowel sounds, without guarding, and without rebound.   Neurologic:  Alert and  oriented x4;  grossly normal neurologically.  Impression/Plan: CAELEN REIERSON is here for an  endoscopy  to be performed for  evaluation of dysphagia    Risks, benefits, limitations, and alternatives regarding endoscopy have been reviewed with the patient.  Questions have been answered.  All parties agreeable.   [name], MD  09/11/2019, 9:14 AM

## 2019-09-11 NOTE — Anesthesia Procedure Notes (Signed)
Date/Time: 09/11/2019 9:20 AM Performed by: Jerrye Noble, CRNA Pre-anesthesia Checklist: Patient identified, Emergency Drugs available, Suction available and Patient being monitored Oxygen Delivery Method: Nasal cannula

## 2019-09-11 NOTE — Anesthesia Preprocedure Evaluation (Signed)
Anesthesia Evaluation  Patient identified by MRN, date of birth, ID band Patient awake    Reviewed: Allergy & Precautions, NPO status , Patient's Chart, lab work & pertinent test results, reviewed documented beta blocker date and time   History of Anesthesia Complications Negative for: history of anesthetic complications  Airway Mallampati: II  TM Distance: >3 FB Neck ROM: Full    Dental no notable dental hx. (+) Teeth Intact   Pulmonary neg sleep apnea, neg COPD, Not current smoker, former smoker,    Pulmonary exam normal breath sounds clear to auscultation       Cardiovascular hypertension, Pt. on medications and Pt. on home beta blockers (-) Past MI and (-) CHF (-) dysrhythmias (-) Valvular Problems/Murmurs Rhythm:Regular Rate:Normal - Systolic murmurs    Neuro/Psych neg Seizures CVA (R sided weakness), No Residual Symptoms    GI/Hepatic Neg liver ROS, GERD  Medicated and Controlled,  Endo/Other  negative endocrine ROSneg diabetes  Renal/GU negative Renal ROS     Musculoskeletal   Abdominal   Peds  Hematology   Anesthesia Other Findings   Reproductive/Obstetrics                             Anesthesia Physical  Anesthesia Plan  ASA: III  Anesthesia Plan: General   Post-op Pain Management:    Induction: Intravenous  PONV Risk Score and Plan: 2 and Propofol infusion, TIVA and Treatment may vary due to age or medical condition  Airway Management Planned: Nasal Cannula  Additional Equipment: None  Intra-op Plan:   Post-operative Plan:   Informed Consent: I have reviewed the patients History and Physical, chart, labs and discussed the procedure including the risks, benefits and alternatives for the proposed anesthesia with the patient or authorized representative who has indicated his/her understanding and acceptance.     Dental advisory given  Plan Discussed with: CRNA and  Surgeon  Anesthesia Plan Comments: (Discussed risks of anesthesia with patient, including possibility of difficulty with spontaneous ventilation under anesthesia necessitating airway intervention, PONV, and rare risks such as cardiac or respiratory or neurological events. Patient understands.)        Anesthesia Quick Evaluation

## 2019-09-11 NOTE — Transfer of Care (Signed)
Immediate Anesthesia Transfer of Care Note  Patient: Andrew Mullins  Procedure(s) Performed: ESOPHAGOGASTRODUODENOSCOPY (EGD) WITH PROPOFOL with Dilation (N/A )  Patient Location: PACU and Endoscopy Unit  Anesthesia Type:General  Level of Consciousness: awake and drowsy  Airway & Oxygen Therapy: Patient Spontanous Breathing  Post-op Assessment: Report given to RN and Post -op Vital signs reviewed and stable  Post vital signs: Reviewed and stable  Last Vitals:  Vitals Value Taken Time  BP 138/96 09/11/19 0933  Temp    Pulse 69 09/11/19 0933  Resp 21 09/11/19 0933  SpO2 97 % 09/11/19 0933  Vitals shown include unvalidated device data.  Last Pain:  Vitals:   09/11/19 0851  TempSrc: Temporal  PainSc: 0-No pain         Complications: No complications documented.

## 2019-09-11 NOTE — Op Note (Signed)
[facility] Gastroenterology Patient Name: Andrew Mullins Procedure Date: 09/11/2019 9:20 AM MRN: [id number] Account #: [id number] Date of Birth: [birth year] Admit Type: Outpatient Age: 64 Room: [facility] ENDO ROOM 4 Gender: Male Note Status: Finalized Procedure:             Upper GI endoscopy Indications:           Dysphagia, Follow-up of esophageal stenosis Providers:             [name] MD, MD Referring MD:          No Local Md, MD (Referring MD) Medicines:             Monitored Anesthesia Care Complications:         No immediate complications. Procedure:             Pre-Anesthesia Assessment:                        - Prior to the procedure, a History and Physical was                         performed, and patient medications, allergies and                         sensitivities were reviewed. The patient's tolerance                         of previous anesthesia was reviewed.                        - The risks and benefits of the procedure and the                         sedation options and risks were discussed with the                         patient. All questions were answered and informed                         consent was obtained.                        - ASA Grade Assessment: II - A patient with mild                         systemic disease.                        After obtaining informed consent, the endoscope was                         passed under direct vision. Throughout the procedure,                         the patient's blood pressure, pulse, and oxygen                         saturations were monitored continuously. The Endoscope                         was introduced through  the mouth, and advanced to the                         third part of duodenum. The upper GI endoscopy was                         accomplished with ease. The patient tolerated the                         procedure well. Findings:      One benign-appearing, intrinsic  severe (stenosis; an endoscope cannot       pass) stenosis was found in the upper third of the esophagus. This       stenosis measured 1 cm (inner diameter) x 1 cm (in length). The stenosis       was traversed after dilation. A TTS dilator was passed through the       scope. Dilation with a 10-26-10 mm balloon and a 12-13.5-15 mm balloon       dilator was performed to 13.5 mm. The dilation site was examined       following endoscope reinsertion and showed moderate mucosal disruption.      The stomach was normal.      The examined duodenum was normal.      The cardia and gastric fundus were normal on retroflexion. Impression:            - Benign-appearing esophageal stenosis. Dilated.                        - Normal stomach.                        - Normal examined duodenum.                        - No specimens collected. Recommendation:        - Discharge patient to home (with escort).                        - Resume previous diet.                        - Continue present medications.                        - Repeat upper endoscopy in 2 weeks to evaluate the                         response to therapy. Procedure Code(s):     --- Professional ---                        [phone], Esophagogastroduodenoscopy, flexible,                         transoral; with transendoscopic balloon dilation of                         esophagus (less than 30 mm diameter) Diagnosis Code(s):     --- Professional ---  K22.2, Esophageal obstruction                        R13.10, Dysphagia, unspecified CPT copyright 2019 American Medical Association. All rights reserved. The codes documented in this report are preliminary and upon coder review may  be revised to meet current compliance requirements. [name], MD [name] MD, MD 09/11/2019 9:34:27 AM This report has been signed electronically. Number of Addenda: 0 Note Initiated On: 09/11/2019 9:20 AM Estimated Blood Loss:  Estimated  blood loss: none.      [facility]

## 2019-09-11 NOTE — Anesthesia Postprocedure Evaluation (Signed)
Anesthesia Post Note  Patient: Andrew Mullins  Procedure(s) Performed: ESOPHAGOGASTRODUODENOSCOPY (EGD) WITH PROPOFOL with Dilation (N/A )  Patient location during evaluation: Endoscopy Anesthesia Type: General Level of consciousness: awake and alert Pain management: pain level controlled Vital Signs Assessment: post-procedure vital signs reviewed and stable Respiratory status: spontaneous breathing, nonlabored ventilation, respiratory function stable and patient connected to nasal cannula oxygen Cardiovascular status: blood pressure returned to baseline and stable Postop Assessment: no apparent nausea or vomiting Anesthetic complications: no   No complications documented.   Last Vitals:  Vitals:   09/11/19 0940 09/11/19 0950  BP: (!) 151/86 (!) 150/83  Pulse: (!) 55 (!) 50  Resp: 17 (!) 9  Temp:    SpO2: 97% 98%    Last Pain:  Vitals:   09/11/19 0930  TempSrc: Temporal  PainSc:                  Arita Miss

## 2019-09-14 ENCOUNTER — Encounter: Payer: Self-pay | Admitting: Gastroenterology

## 2019-09-15 ENCOUNTER — Other Ambulatory Visit: Payer: Self-pay

## 2019-09-15 DIAGNOSIS — R1310 Dysphagia, unspecified: Secondary | ICD-10-CM

## 2019-09-15 DIAGNOSIS — K222 Esophageal obstruction: Secondary | ICD-10-CM

## 2019-09-30 ENCOUNTER — Other Ambulatory Visit
Admission: RE | Admit: 2019-09-30 | Discharge: 2019-09-30 | Disposition: A | Discharge status: Home / Self Care | Payer: BC Managed Care – PPO | Source: Ambulatory Visit | Attending: Gastroenterology | Admitting: Gastroenterology

## 2019-09-30 ENCOUNTER — Other Ambulatory Visit: Payer: Self-pay

## 2019-09-30 DIAGNOSIS — Z20822 Contact with and (suspected) exposure to covid-19: Secondary | ICD-10-CM | POA: Diagnosis not present

## 2019-09-30 DIAGNOSIS — Z01812 Encounter for preprocedural laboratory examination: Secondary | ICD-10-CM | POA: Insufficient documentation

## 2019-10-01 LAB — SARS CORONAVIRUS 2 (TAT 6-24 HRS): SARS Coronavirus 2: NEGATIVE

## 2019-10-02 ENCOUNTER — Ambulatory Visit: Payer: BC Managed Care – PPO | Admitting: Certified Registered"

## 2019-10-02 ENCOUNTER — Encounter: Payer: Self-pay | Admitting: Gastroenterology

## 2019-10-02 ENCOUNTER — Encounter
Admission: RE | Disposition: A | Discharge status: Home / Self Care | Payer: Self-pay | Source: Home / Self Care | Attending: Gastroenterology

## 2019-10-02 ENCOUNTER — Ambulatory Visit
Admission: RE | Admit: 2019-10-02 | Discharge: 2019-10-02 | Disposition: A | Discharge status: Home / Self Care | Payer: BC Managed Care – PPO | Attending: Gastroenterology | Admitting: Gastroenterology

## 2019-10-02 ENCOUNTER — Other Ambulatory Visit: Payer: Self-pay

## 2019-10-02 DIAGNOSIS — Z7982 Long term (current) use of aspirin: Secondary | ICD-10-CM | POA: Insufficient documentation

## 2019-10-02 DIAGNOSIS — R1310 Dysphagia, unspecified: Secondary | ICD-10-CM | POA: Diagnosis present

## 2019-10-02 DIAGNOSIS — K222 Esophageal obstruction: Secondary | ICD-10-CM | POA: Diagnosis not present

## 2019-10-02 DIAGNOSIS — Z79899 Other long term (current) drug therapy: Secondary | ICD-10-CM | POA: Insufficient documentation

## 2019-10-02 DIAGNOSIS — Z8673 Personal history of transient ischemic attack (TIA), and cerebral infarction without residual deficits: Secondary | ICD-10-CM | POA: Insufficient documentation

## 2019-10-02 DIAGNOSIS — I10 Essential (primary) hypertension: Secondary | ICD-10-CM | POA: Insufficient documentation

## 2019-10-02 DIAGNOSIS — Z8546 Personal history of malignant neoplasm of prostate: Secondary | ICD-10-CM | POA: Diagnosis not present

## 2019-10-02 DIAGNOSIS — Z8501 Personal history of malignant neoplasm of esophagus: Secondary | ICD-10-CM | POA: Insufficient documentation

## 2019-10-02 DIAGNOSIS — Z87891 Personal history of nicotine dependence: Secondary | ICD-10-CM | POA: Diagnosis not present

## 2019-10-02 DIAGNOSIS — K219 Gastro-esophageal reflux disease without esophagitis: Secondary | ICD-10-CM | POA: Insufficient documentation

## 2019-10-02 HISTORY — PX: ESOPHAGOGASTRODUODENOSCOPY (EGD) WITH PROPOFOL: SHX5813

## 2019-10-02 SURGERY — ESOPHAGOGASTRODUODENOSCOPY (EGD) WITH PROPOFOL
Anesthesia: General

## 2019-10-02 MED ORDER — GLYCOPYRROLATE 0.2 MG/ML IJ SOLN
INTRAMUSCULAR | Status: AC
  Filled 2019-10-02: qty 1

## 2019-10-02 MED ORDER — PROPOFOL 500 MG/50ML IV EMUL
INTRAVENOUS | Status: AC
  Filled 2019-10-02: qty 50

## 2019-10-02 MED ORDER — PROPOFOL 500 MG/50ML IV EMUL
INTRAVENOUS | Status: DC | PRN
  Administered 2019-10-02: 150 ug/kg/min via INTRAVENOUS

## 2019-10-02 MED ORDER — LIDOCAINE HCL (PF) 2 % IJ SOLN
INTRAMUSCULAR | Status: AC
  Filled 2019-10-02: qty 5

## 2019-10-02 MED ORDER — PROPOFOL 10 MG/ML IV BOLUS
INTRAVENOUS | Status: DC | PRN
  Administered 2019-10-02: 100 mg via INTRAVENOUS

## 2019-10-02 MED ORDER — SODIUM CHLORIDE 0.9 % IV SOLN: INTRAVENOUS | Status: DC

## 2019-10-02 NOTE — Op Note (Signed)
[facility] Gastroenterology Patient Name: Andrew Mullins Procedure Date: 10/02/2019 8:55 AM MRN: [id number] Account #: [id number] Date of Birth: 1955/12/15 Admit Type: Outpatient Age: 64 Room: [facility] ENDO ROOM 4 Gender: Male Note Status: Finalized Procedure:             Upper GI endoscopy Indications:           Dysphagia Providers:             [name] MD, MD Referring MD:          Biagio Borg, MD (Referring MD) Medicines:             Monitored Anesthesia Care Complications:         No immediate complications. Procedure:             Pre-Anesthesia Assessment:                        - Prior to the procedure, a History and Physical was                         performed, and patient medications, allergies and                         sensitivities were reviewed. The patient's tolerance                         of previous anesthesia was reviewed.                        - The risks and benefits of the procedure and the                         sedation options and risks were discussed with the                         patient. All questions were answered and informed                         consent was obtained.                        - ASA Grade Assessment: II - A patient with mild                         systemic disease.                        After obtaining informed consent, the endoscope was                         passed under direct vision. Throughout the procedure,                         the patient's blood pressure, pulse, and oxygen                         saturations were monitored continuously. The Endoscope                         was introduced through the mouth, and advanced  to the                         third part of duodenum. The upper GI endoscopy was                         accomplished with ease. The patient tolerated the                         procedure well. Findings:      One benign-appearing, intrinsic severe (stenosis; an endoscope  cannot       pass) stenosis was found in the upper third of the esophagus. This       stenosis measured 1 cm (inner diameter) x 1 cm (in length). The stenosis       was traversed after dilation. A TTS dilator was passed through the       scope. Dilation with a 10-26-10 mm balloon and a 12-13.5-15 mm balloon       dilator was performed to 15 mm. The dilation site was examined and       showed moderate mucosal disruption.      The cardia and gastric fundus were normal on retroflexion. Impression:            - Benign-appearing esophageal stenosis. Dilated.                        - No specimens collected. Recommendation:        - Discharge patient to home (with escort).                        - Resume previous diet.                        - Continue present medications.                        - Repeat upper endoscopy in 2 weeks for retreatment. Procedure Code(s):     --- Professional ---                        [phone], Esophagogastroduodenoscopy, flexible,                         transoral; with transendoscopic balloon dilation of                         esophagus (less than 30 mm diameter) Diagnosis Code(s):     --- Professional ---                        K22.2, Esophageal obstruction                        R13.10, Dysphagia, unspecified CPT copyright 2019 American Medical Association. All rights reserved. The codes documented in this report are preliminary and upon coder review may  be revised to meet current compliance requirements. [name], MD [name] MD, MD 10/02/2019 9:06:58 AM This report has been signed electronically. Number of Addenda: 0 Note Initiated On: 10/02/2019 8:55 AM Estimated Blood Loss:  Estimated blood loss: none.      [facility]

## 2019-10-02 NOTE — Transfer of Care (Signed)
Immediate Anesthesia Transfer of Care Note  Patient: Andrew Mullins  Procedure(s) Performed: ESOPHAGOGASTRODUODENOSCOPY (EGD) WITH PROPOFOL with Dilation (N/A )  Patient Location: PACU and Endoscopy Unit  Anesthesia Type:General  Level of Consciousness: drowsy  Airway & Oxygen Therapy: Patient Spontanous Breathing  Post-op Assessment: Report given to RN  Post vital signs: stable  Last Vitals:  Vitals Value Taken Time  BP 133/85 10/02/19 0910  Temp    Pulse 77 10/02/19 0910  Resp 16 10/02/19 0910  SpO2 96 % 10/02/19 0910  Vitals shown include unvalidated device data.  Last Pain:  Vitals:   10/02/19 0818  TempSrc: Temporal  PainSc: 0-No pain         Complications: No complications documented.

## 2019-10-02 NOTE — Anesthesia Postprocedure Evaluation (Signed)
Anesthesia Post Note  Patient: Andrew Mullins  Procedure(s) Performed: ESOPHAGOGASTRODUODENOSCOPY (EGD) WITH PROPOFOL with Dilation (N/A )  Patient location during evaluation: PACU Anesthesia Type: General Level of consciousness: awake and alert Pain management: pain level controlled Vital Signs Assessment: post-procedure vital signs reviewed and stable Respiratory status: spontaneous breathing, nonlabored ventilation and respiratory function stable Cardiovascular status: blood pressure returned to baseline and stable Postop Assessment: no apparent nausea or vomiting Anesthetic complications: no   No complications documented.   Last Vitals:  Vitals:   10/02/19 0920 10/02/19 0930  BP: 131/86 (!) 134/95  Pulse: 71 63  Resp: 18 13  Temp:    SpO2: 94% 98%    Last Pain:  Vitals:   10/02/19 0818  TempSrc: Temporal  PainSc: 0-No pain                 Brett Canales Aurianna Earlywine

## 2019-10-02 NOTE — H&P (Signed)
[name], MD [address], [place], [place], [place], [id number], [place], [place], [place], [address] Phone: [phone]  Fax: [phone]  Primary Care Physician:  Baxter Hire, MD   Pre-Procedure History & Physical: HPI:  Andrew Mullins is a 64 y.o. male is here for an endoscopy    Past Medical History:  Diagnosis Date  . 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 GERD (gastroesophageal reflux disease)   . Hypertension   . Prostate cancer ([place])   . Stroke ([place]) 2020   no wealness    Past Surgical History:  Procedure Laterality Date  . broken bones repair    . ESOPHAGOGASTRODUODENOSCOPY (EGD) WITH PROPOFOL N/A 02/18/2018   Procedure: ESOPHAGOGASTRODUODENOSCOPY (EGD) WITH PROPOFOL;  Surgeon: [name], MD;  Location: [facility] ENDOSCOPY;  Service: Gastroenterology;  Laterality: N/A;  . ESOPHAGOGASTRODUODENOSCOPY (EGD) WITH PROPOFOL N/A 04/01/2018   Procedure: ESOPHAGOGASTRODUODENOSCOPY (EGD) WITH PROPOFOL;  Surgeon: [name], MD;  Location: [facility] ENDOSCOPY;  Service: Gastroenterology;  Laterality: N/A;  . ESOPHAGOGASTRODUODENOSCOPY (EGD) WITH PROPOFOL N/A 12/02/2018   Procedure: ESOPHAGOGASTRODUODENOSCOPY (EGD) WITH PROPOFOL with Dilation;  Surgeon: [name], MD;  Location: [facility] ENDOSCOPY;  Service: Gastroenterology;  Laterality: N/A;  . ESOPHAGOGASTRODUODENOSCOPY (EGD) WITH PROPOFOL N/A 05/19/2019   Procedure: ESOPHAGOGASTRODUODENOSCOPY (EGD) WITH PROPOFOL;  Surgeon: [name], MD;  Location: [facility] ENDOSCOPY;  Service: Gastroenterology;  Laterality: N/A;  . ESOPHAGOGASTRODUODENOSCOPY (EGD) WITH PROPOFOL N/A 05/18/2019   Procedure: ESOPHAGOGASTRODUODENOSCOPY (EGD) WITH PROPOFOL with Dilation;  Surgeon: [name], MD;  Location: [facility] ENDOSCOPY;  Service: Gastroenterology;  Laterality: N/A;  . ESOPHAGOGASTRODUODENOSCOPY (EGD) WITH PROPOFOL N/A 06/25/2019   Procedure: ESOPHAGOGASTRODUODENOSCOPY (EGD) WITH PROPOFOL  with Dilation;  Surgeon: [name], MD;   Location: [facility] ENDOSCOPY;  Service: Gastroenterology;  Laterality: N/A;  . ESOPHAGOGASTRODUODENOSCOPY (EGD) WITH PROPOFOL N/A 07/09/2019   Procedure: ESOPHAGOGASTRODUODENOSCOPY (EGD) WITH PROPOFOL with Dilation;  Surgeon: [name], MD;  Location: [facility] ENDOSCOPY;  Service: Gastroenterology;  Laterality: N/A;  Pt requests early morning  . ESOPHAGOGASTRODUODENOSCOPY (EGD) WITH PROPOFOL N/A 08/11/2019   Procedure: ESOPHAGOGASTRODUODENOSCOPY (EGD) WITH PROPOFOL with Dilation;  Surgeon: [name], MD;  Location: [facility] ENDOSCOPY;  Service: Gastroenterology;  Laterality: N/A;  . ESOPHAGOGASTRODUODENOSCOPY (EGD) WITH PROPOFOL N/A 09/11/2019   Procedure: ESOPHAGOGASTRODUODENOSCOPY (EGD) WITH PROPOFOL with Dilation;  Surgeon: [name], MD;  Location: [facility] ENDOSCOPY;  Service: Gastroenterology;  Laterality: N/A;  . EYE SURGERY     retinal detatchment  . FRACTURE SURGERY    . radical prostate    . TONSILLECTOMY      Prior to Admission medications   Medication Sig Start Date End Date Taking? Authorizing Provider  aspirin EC 325 MG EC tablet Take 1 tablet (325 mg total) by mouth daily. 09/19/18  Yes Fritzi Mandes, MD  atorvastatin (LIPITOR) 40 MG tablet Take 1 tablet (40 mg total) by mouth daily at 6 PM. 09/18/18  Yes Fritzi Mandes, MD  famciclovir (FAMVIR) 250 MG tablet Take 250 mg by mouth 2 (two) times daily.   Yes [provider]  losartan (COZAAR) 50 MG tablet Take 50 mg by mouth daily. 08/20/18  Yes [provider]  metoprolol tartrate (LOPRESSOR) 25 MG tablet Take 25 mg by mouth 2 (two) times daily.   Yes [provider]  omeprazole (PRILOSEC) 20 MG capsule Take 20 mg by mouth daily.   Yes [provider]    Allergies as of 09/15/2019  . (No Known Allergies)    History reviewed. No pertinent family history.  Social  History   Socioeconomic History  . Marital status: Married    Spouse name: Not on file  . Number of children: Not on file  . Years of education:  Not on file  . Highest education level: Not on file  Occupational History  . Not on file  Tobacco Use  . Smoking status: Former Research scientist (life sciences)  . Smokeless tobacco: Never Used  Vaping Use  . Vaping Use: Never used  Substance and Sexual Activity  . Alcohol use: Never  . Drug use: Never  . Sexual activity: Not on file  Other Topics Concern  . Not on file  Social History Narrative   Lives at home with wife   Social Determinants of Health   Financial Resource Strain:   . Difficulty of Paying Living Expenses: Not on file  Food Insecurity:   . Worried About Charity fundraiser in the Last Year: Not on file  . Ran Out of Food in the Last Year: Not on file  Transportation Needs:   . Lack of Transportation (Medical): Not on file  . Lack of Transportation (Non-Medical): Not on file  Physical Activity:   . Days of Exercise per Week: Not on file  . Minutes of Exercise per Session: Not on file  Stress:   . Feeling of Stress : Not on file  Social Connections:   . Frequency of Communication with Friends and Family: Not on file  . Frequency of Social Gatherings with Friends and Family: Not on file  . Attends Religious Services: Not on file  . Active Member of Clubs or Organizations: Not on file  . Attends Archivist Meetings: Not on file  . Marital Status: Not on file  Intimate Partner Violence:   . Fear of Current or Ex-Partner: Not on file  . Emotionally Abused: Not on file  . Physically Abused: Not on file  . Sexually Abused: Not on file    Review of Systems: See HPI, otherwise negative ROS  Physical Exam: BP (!) 171/96   Pulse 64   Temp (!) 96.4 F (35.8 C) (Temporal)   Resp 20   Ht 6' (1.829 m)   Wt 95.3 kg   SpO2 100%   BMI 28.48 kg/m  General:   Alert,  pleasant and cooperative in NAD Head:  Normocephalic and atraumatic. Neck:  Supple; no masses or thyromegaly. Lungs:  Clear throughout to auscultation, normal respiratory effort.    Heart:  +S1, +S2, Regular  rate and rhythm, No edema. Abdomen:  Soft, nontender and nondistended. Normal bowel sounds, without guarding, and without rebound.   Neurologic:  Alert and  oriented x4;  grossly normal neurologically.  Impression/Plan: Andrew Mullins is here for an endoscopy  to be performed for  evaluation of dysphagia    Risks, benefits, limitations, and alternatives regarding endoscopy have been reviewed with the patient.  Questions have been answered.  All parties agreeable.   [name], MD  10/02/2019, 8:55 AM

## 2019-10-02 NOTE — Anesthesia Preprocedure Evaluation (Signed)
Anesthesia Evaluation  Patient identified by MRN, date of birth, ID band Patient awake    Reviewed: Allergy & Precautions, H&P , NPO status , Patient's Chart, lab work & pertinent test results  History of Anesthesia Complications Negative for: history of anesthetic complications  Airway Mallampati: II  TM Distance: >3 FB Neck ROM: full    Dental  (+) Teeth Intact   Pulmonary neg pulmonary ROS, neg sleep apnea, neg COPD, former smoker,    breath sounds clear to auscultation       Cardiovascular hypertension, (-) angina(-) Past MI and (-) Cardiac Stents (-) dysrhythmias  Rhythm:regular Rate:Normal     Neuro/Psych CVA (resolved within 2 weeks), No Residual Symptoms negative psych ROS   GI/Hepatic Neg liver ROS, GERD  Controlled,  Endo/Other  negative endocrine ROS  Renal/GU negative Renal ROS  negative genitourinary   Musculoskeletal   Abdominal   Peds  Hematology negative hematology ROS (+)   Anesthesia Other Findings Past Medical History: No date: Cancer ([place])     Comment:  esophageal No date: GERD (gastroesophageal reflux disease) No date: Hypertension No date: Prostate cancer ([place]) 2020: Stroke ([place])     Comment:  no wealness  Past Surgical History: No date: broken bones repair 02/18/2018: ESOPHAGOGASTRODUODENOSCOPY (EGD) WITH PROPOFOL; N/A     Comment:  Procedure: ESOPHAGOGASTRODUODENOSCOPY (EGD) WITH               PROPOFOL;  Surgeon: [name], MD;  Location: [facility]               ENDOSCOPY;  Service: Gastroenterology;  Laterality: N/A; 04/01/2018: ESOPHAGOGASTRODUODENOSCOPY (EGD) WITH PROPOFOL; N/A     Comment:  Procedure: ESOPHAGOGASTRODUODENOSCOPY (EGD) WITH               PROPOFOL;  Surgeon: [name], MD;  Location: [facility]               ENDOSCOPY;  Service: Gastroenterology;  Laterality: N/A; 12/02/2018: ESOPHAGOGASTRODUODENOSCOPY (EGD) WITH PROPOFOL; N/A     Comment:  Procedure:  ESOPHAGOGASTRODUODENOSCOPY (EGD) WITH               PROPOFOL with Dilation;  Surgeon: [name], MD;                Location: [facility] ENDOSCOPY;  Service: Gastroenterology;                Laterality: N/A; 05/19/2019: ESOPHAGOGASTRODUODENOSCOPY (EGD) WITH PROPOFOL; N/A     Comment:  Procedure: ESOPHAGOGASTRODUODENOSCOPY (EGD) WITH               PROPOFOL;  Surgeon: [name], MD;  Location: [facility]               ENDOSCOPY;  Service: Gastroenterology;  Laterality: N/A; 05/18/2019: ESOPHAGOGASTRODUODENOSCOPY (EGD) WITH PROPOFOL; N/A     Comment:  Procedure: ESOPHAGOGASTRODUODENOSCOPY (EGD) WITH               PROPOFOL with Dilation;  Surgeon: [name], MD;                Location: [facility] ENDOSCOPY;  Service: Gastroenterology;                Laterality: N/A; 06/25/2019: ESOPHAGOGASTRODUODENOSCOPY (EGD) WITH PROPOFOL; N/A     Comment:  Procedure: ESOPHAGOGASTRODUODENOSCOPY (EGD) WITH               PROPOFOL  with Dilation;  Surgeon: [name], MD;  Location: [facility] ENDOSCOPY;  Service: Gastroenterology;                Laterality: N/A; 07/09/2019: ESOPHAGOGASTRODUODENOSCOPY (EGD) WITH PROPOFOL; N/A     Comment:  Procedure: ESOPHAGOGASTRODUODENOSCOPY (EGD) WITH               PROPOFOL with Dilation;  Surgeon: [name], MD;                Location: [facility] ENDOSCOPY;  Service: Gastroenterology;                Laterality: N/A;  Pt requests early morning 08/11/2019: ESOPHAGOGASTRODUODENOSCOPY (EGD) WITH PROPOFOL; N/A     Comment:  Procedure: ESOPHAGOGASTRODUODENOSCOPY (EGD) WITH               PROPOFOL with Dilation;  Surgeon: [name], MD;                Location: [facility] ENDOSCOPY;  Service: Gastroenterology;                Laterality: N/A; 09/11/2019: ESOPHAGOGASTRODUODENOSCOPY (EGD) WITH PROPOFOL; N/A     Comment:  Procedure: ESOPHAGOGASTRODUODENOSCOPY (EGD) WITH               PROPOFOL with Dilation;  Surgeon: [name], MD;                Location: [facility] ENDOSCOPY;  Service:  Gastroenterology;                Laterality: N/A; No date: EYE SURGERY     Comment:  retinal detatchment No date: FRACTURE SURGERY No date: radical prostate No date: TONSILLECTOMY  BMI    Body Mass Index: 28.48 kg/m      Reproductive/Obstetrics negative OB ROS                             Anesthesia Physical Anesthesia Plan  ASA: II  Anesthesia Plan: General   Post-op Pain Management:    Induction:   PONV Risk Score and Plan: Propofol infusion and TIVA  Airway Management Planned: Nasal Cannula  Additional Equipment:   Intra-op Plan:   Post-operative Plan:   Informed Consent: I have reviewed the patients History and Physical, chart, labs and discussed the procedure including the risks, benefits and alternatives for the proposed anesthesia with the patient or authorized representative who has indicated his/her understanding and acceptance.     Dental Advisory Given  Plan Discussed with: Anesthesiologist, CRNA and Surgeon  Anesthesia Plan Comments:         Anesthesia Quick Evaluation

## 2019-10-07 ENCOUNTER — Telehealth: Payer: Self-pay

## 2019-10-07 NOTE — Telephone Encounter (Signed)
Called patient to provide the results and recommendations from Dr. Vicente Males. Dr. Vicente Males would like for patient to do a repeat EGD in 2 weeks. Left voicemail message to call the office.

## 2019-10-09 ENCOUNTER — Other Ambulatory Visit: Payer: Self-pay

## 2019-10-09 DIAGNOSIS — R1310 Dysphagia, unspecified: Secondary | ICD-10-CM

## 2019-10-09 DIAGNOSIS — K222 Esophageal obstruction: Secondary | ICD-10-CM

## 2019-11-02 ENCOUNTER — Other Ambulatory Visit
Admission: RE | Admit: 2019-11-02 | Discharge: 2019-11-02 | Disposition: A | Discharge status: Home / Self Care | Payer: BC Managed Care – PPO | Source: Ambulatory Visit | Attending: Gastroenterology | Admitting: Gastroenterology

## 2019-11-02 ENCOUNTER — Other Ambulatory Visit: Payer: Self-pay

## 2019-11-02 DIAGNOSIS — Z20822 Contact with and (suspected) exposure to covid-19: Secondary | ICD-10-CM | POA: Diagnosis not present

## 2019-11-02 DIAGNOSIS — Z01812 Encounter for preprocedural laboratory examination: Secondary | ICD-10-CM | POA: Insufficient documentation

## 2019-11-03 LAB — SARS CORONAVIRUS 2 (TAT 6-24 HRS): SARS Coronavirus 2: NEGATIVE

## 2019-11-04 ENCOUNTER — Encounter
Admission: RE | Disposition: A | Discharge status: Home / Self Care | Payer: Self-pay | Source: Ambulatory Visit | Attending: Gastroenterology

## 2019-11-04 ENCOUNTER — Ambulatory Visit: Payer: BC Managed Care – PPO | Admitting: Registered Nurse

## 2019-11-04 ENCOUNTER — Encounter: Payer: Self-pay | Admitting: Gastroenterology

## 2019-11-04 ENCOUNTER — Ambulatory Visit
Admission: RE | Admit: 2019-11-04 | Discharge: 2019-11-04 | Disposition: A | Discharge status: Home / Self Care | Payer: BC Managed Care – PPO | Source: Ambulatory Visit | Attending: Gastroenterology | Admitting: Gastroenterology

## 2019-11-04 DIAGNOSIS — I10 Essential (primary) hypertension: Secondary | ICD-10-CM | POA: Diagnosis not present

## 2019-11-04 DIAGNOSIS — K222 Esophageal obstruction: Secondary | ICD-10-CM | POA: Insufficient documentation

## 2019-11-04 DIAGNOSIS — Z8546 Personal history of malignant neoplasm of prostate: Secondary | ICD-10-CM | POA: Diagnosis not present

## 2019-11-04 DIAGNOSIS — Z87891 Personal history of nicotine dependence: Secondary | ICD-10-CM | POA: Insufficient documentation

## 2019-11-04 DIAGNOSIS — Z8673 Personal history of transient ischemic attack (TIA), and cerebral infarction without residual deficits: Secondary | ICD-10-CM | POA: Insufficient documentation

## 2019-11-04 DIAGNOSIS — Z79899 Other long term (current) drug therapy: Secondary | ICD-10-CM | POA: Diagnosis not present

## 2019-11-04 DIAGNOSIS — Z8501 Personal history of malignant neoplasm of esophagus: Secondary | ICD-10-CM | POA: Diagnosis not present

## 2019-11-04 DIAGNOSIS — K219 Gastro-esophageal reflux disease without esophagitis: Secondary | ICD-10-CM | POA: Diagnosis not present

## 2019-11-04 DIAGNOSIS — Z7982 Long term (current) use of aspirin: Secondary | ICD-10-CM | POA: Insufficient documentation

## 2019-11-04 DIAGNOSIS — R1310 Dysphagia, unspecified: Secondary | ICD-10-CM

## 2019-11-04 HISTORY — PX: ESOPHAGOGASTRODUODENOSCOPY (EGD) WITH PROPOFOL: SHX5813

## 2019-11-04 SURGERY — ESOPHAGOGASTRODUODENOSCOPY (EGD) WITH PROPOFOL
Anesthesia: General

## 2019-11-04 MED ORDER — LIDOCAINE HCL (CARDIAC) PF 100 MG/5ML IV SOSY
PREFILLED_SYRINGE | INTRAVENOUS | Status: DC | PRN
  Administered 2019-11-04: 100 mg via INTRAVENOUS

## 2019-11-04 MED ORDER — ESMOLOL HCL 100 MG/10ML IV SOLN
INTRAVENOUS | Status: AC
  Filled 2019-11-04: qty 10

## 2019-11-04 MED ORDER — EPHEDRINE 5 MG/ML INJ
INTRAVENOUS | Status: AC
  Filled 2019-11-04: qty 20

## 2019-11-04 MED ORDER — LIDOCAINE HCL (PF) 2 % IJ SOLN
INTRAMUSCULAR | Status: AC
  Filled 2019-11-04: qty 25

## 2019-11-04 MED ORDER — PROPOFOL 500 MG/50ML IV EMUL
INTRAVENOUS | Status: DC | PRN
  Administered 2019-11-04: 150 ug/kg/min via INTRAVENOUS

## 2019-11-04 MED ORDER — SODIUM CHLORIDE 0.9 % IV SOLN: INTRAVENOUS | Status: DC

## 2019-11-04 MED ORDER — PROPOFOL 500 MG/50ML IV EMUL
INTRAVENOUS | Status: AC
  Filled 2019-11-04: qty 100

## 2019-11-04 MED ORDER — LIDOCAINE HCL (PF) 2 % IJ SOLN
INTRAMUSCULAR | Status: AC
  Filled 2019-11-04: qty 5

## 2019-11-04 MED ORDER — PROPOFOL 10 MG/ML IV BOLUS
INTRAVENOUS | Status: DC | PRN
  Administered 2019-11-04: 100 mg via INTRAVENOUS

## 2019-11-04 MED ORDER — PHENYLEPHRINE HCL (PRESSORS) 10 MG/ML IV SOLN
INTRAVENOUS | Status: AC
  Filled 2019-11-04: qty 1

## 2019-11-04 NOTE — H&P (Signed)
[name], MD [address] [place], [place], [place], [address] [id number], [place], [place], [place], [address] Phone: [phone]  Fax: [phone]  Primary Care Physician:  Baxter Hire, MD   Pre-Procedure History & Physical: HPI:  Andrew Mullins is a 64 y.o. male is here for an endoscopy    Past Medical History:  Diagnosis Date  . 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 GERD (gastroesophageal reflux disease)   . Hypertension   . Prostate cancer ([place])   . Stroke ([place]) 2020   no wealness    Past Surgical History:  Procedure Laterality Date  . broken bones repair    . ESOPHAGOGASTRODUODENOSCOPY (EGD) WITH PROPOFOL N/A 02/18/2018   Procedure: ESOPHAGOGASTRODUODENOSCOPY (EGD) WITH PROPOFOL;  Surgeon: [name], MD;  Location: [facility] ENDOSCOPY;  Service: Gastroenterology;  Laterality: N/A;  . ESOPHAGOGASTRODUODENOSCOPY (EGD) WITH PROPOFOL N/A 04/01/2018   Procedure: ESOPHAGOGASTRODUODENOSCOPY (EGD) WITH PROPOFOL;  Surgeon: [name], MD;  Location: [facility] ENDOSCOPY;  Service: Gastroenterology;  Laterality: N/A;  . ESOPHAGOGASTRODUODENOSCOPY (EGD) WITH PROPOFOL N/A 12/02/2018   Procedure: ESOPHAGOGASTRODUODENOSCOPY (EGD) WITH PROPOFOL with Dilation;  Surgeon: [name], MD;  Location: [facility] ENDOSCOPY;  Service: Gastroenterology;  Laterality: N/A;  . ESOPHAGOGASTRODUODENOSCOPY (EGD) WITH PROPOFOL N/A 05/19/2019   Procedure: ESOPHAGOGASTRODUODENOSCOPY (EGD) WITH PROPOFOL;  Surgeon: [name], MD;  Location: [facility] ENDOSCOPY;  Service: Gastroenterology;  Laterality: N/A;  . ESOPHAGOGASTRODUODENOSCOPY (EGD) WITH PROPOFOL N/A 05/18/2019   Procedure: ESOPHAGOGASTRODUODENOSCOPY (EGD) WITH PROPOFOL with Dilation;  Surgeon: [name], MD;  Location: [facility] ENDOSCOPY;  Service: Gastroenterology;  Laterality: N/A;  . ESOPHAGOGASTRODUODENOSCOPY (EGD) WITH PROPOFOL N/A 06/25/2019   Procedure: ESOPHAGOGASTRODUODENOSCOPY (EGD) WITH PROPOFOL  with Dilation;  Surgeon: [name], MD;   Location: [facility] ENDOSCOPY;  Service: Gastroenterology;  Laterality: N/A;  . ESOPHAGOGASTRODUODENOSCOPY (EGD) WITH PROPOFOL N/A 07/09/2019   Procedure: ESOPHAGOGASTRODUODENOSCOPY (EGD) WITH PROPOFOL with Dilation;  Surgeon: [name], MD;  Location: [facility] ENDOSCOPY;  Service: Gastroenterology;  Laterality: N/A;  Pt requests early morning  . ESOPHAGOGASTRODUODENOSCOPY (EGD) WITH PROPOFOL N/A 08/11/2019   Procedure: ESOPHAGOGASTRODUODENOSCOPY (EGD) WITH PROPOFOL with Dilation;  Surgeon: [name], MD;  Location: [facility] ENDOSCOPY;  Service: Gastroenterology;  Laterality: N/A;  . ESOPHAGOGASTRODUODENOSCOPY (EGD) WITH PROPOFOL N/A 09/11/2019   Procedure: ESOPHAGOGASTRODUODENOSCOPY (EGD) WITH PROPOFOL with Dilation;  Surgeon: [name], MD;  Location: [facility] ENDOSCOPY;  Service: Gastroenterology;  Laterality: N/A;  . ESOPHAGOGASTRODUODENOSCOPY (EGD) WITH PROPOFOL N/A 10/02/2019   Procedure: ESOPHAGOGASTRODUODENOSCOPY (EGD) WITH PROPOFOL with Dilation;  Surgeon: [name], MD;  Location: [facility] ENDOSCOPY;  Service: Gastroenterology;  Laterality: N/A;  . EYE SURGERY     retinal detatchment  . FRACTURE SURGERY    . radical prostate    . TONSILLECTOMY      Prior to Admission medications   Medication Sig Start Date End Date Taking? Authorizing Provider  aspirin EC 325 MG EC tablet Take 1 tablet (325 mg total) by mouth daily. 09/19/18  Yes Fritzi Mandes, MD  famciclovir (FAMVIR) 250 MG tablet Take 250 mg by mouth 2 (two) times daily.   Yes [provider]  losartan (COZAAR) 50 MG tablet Take 50 mg by mouth daily. 08/20/18  Yes [provider]  metoprolol tartrate (LOPRESSOR) 25 MG tablet Take 25 mg by mouth 2 (two) times daily.   Yes [provider]  omeprazole (PRILOSEC) 20 MG capsule Take 20 mg by mouth daily.   Yes [provider]  atorvastatin (LIPITOR) 40 MG tablet Take 1 tablet (40 mg total) by mouth daily  at 6 PM. 09/18/18   Fritzi Mandes, MD    Allergies as of 10/09/2019    . (No Known Allergies)    History reviewed. No pertinent family history.  Social History   Socioeconomic History  . Marital status: Married    Spouse name: Not on file  . Number of children: Not on file  . Years of education: Not on file  . Highest education level: Not on file  Occupational History  . Not on file  Tobacco Use  . Smoking status: Former Research scientist (life sciences)  . Smokeless tobacco: Never Used  Vaping Use  . Vaping Use: Never used  Substance and Sexual Activity  . Alcohol use: Never  . Drug use: Never  . Sexual activity: Not on file  Other Topics Concern  . Not on file  Social History Narrative   Lives at home with wife   Social Determinants of Health   Financial Resource Strain:   . Difficulty of Paying Living Expenses: Not on file  Food Insecurity:   . Worried About Charity fundraiser in the Last Year: Not on file  . Ran Out of Food in the Last Year: Not on file  Transportation Needs:   . Lack of Transportation (Medical): Not on file  . Lack of Transportation (Non-Medical): Not on file  Physical Activity:   . Days of Exercise per Week: Not on file  . Minutes of Exercise per Session: Not on file  Stress:   . Feeling of Stress : Not on file  Social Connections:   . Frequency of Communication with Friends and Family: Not on file  . Frequency of Social Gatherings with Friends and Family: Not on file  . Attends Religious Services: Not on file  . Active Member of Clubs or Organizations: Not on file  . Attends Archivist Meetings: Not on file  . Marital Status: Not on file  Intimate Partner Violence:   . Fear of Current or Ex-Partner: Not on file  . Emotionally Abused: Not on file  . Physically Abused: Not on file  . Sexually Abused: Not on file    Review of Systems: See HPI, otherwise negative ROS  Physical Exam: There were no vitals taken for this visit. General:   Alert,  pleasant and cooperative in NAD Head:  Normocephalic and  atraumatic. Neck:  Supple; no masses or thyromegaly. Lungs:  Clear throughout to auscultation, normal respiratory effort.    Heart:  +S1, +S2, Regular rate and rhythm, No edema. Abdomen:  Soft, nontender and nondistended. Normal bowel sounds, without guarding, and without rebound.   Neurologic:  Alert and  oriented x4;  grossly normal neurologically.  Impression/Plan: Andrew Mullins is here for an endoscopy  to be performed for  evaluation of esophageal stricture    Risks, benefits, limitations, and alternatives regarding endoscopy have been reviewed with the patient.  Questions have been answered.  All parties agreeable.   [name], MD  11/04/2019, 1:02 PM

## 2019-11-04 NOTE — Transfer of Care (Signed)
Immediate Anesthesia Transfer of Care Note  Patient: Andrew Mullins  Procedure(s) Performed: ESOPHAGOGASTRODUODENOSCOPY (EGD) WITH PROPOFOL (N/A )  Patient Location: PACU and Endoscopy Unit  Anesthesia Type:General  Level of Consciousness: drowsy  Airway & Oxygen Therapy: Patient Spontanous Breathing  Post-op Assessment: Report given to RN and Post -op Vital signs reviewed and stable  Post vital signs: Reviewed and stable  Last Vitals:  Vitals Value Taken Time  BP    Temp    Pulse    Resp    SpO2      Last Pain:  Vitals:   11/04/19 1252  TempSrc: Temporal         Complications: No complications documented.

## 2019-11-04 NOTE — Anesthesia Preprocedure Evaluation (Addendum)
Anesthesia Evaluation  Patient identified by MRN, date of birth, ID band Patient awake    Reviewed: Allergy & Precautions, H&P , NPO status , Patient's Chart, lab work & pertinent test results, reviewed documented beta blocker date and time   History of Anesthesia Complications Negative for: history of anesthetic complications  Airway Mallampati: II  TM Distance: >3 FB Neck ROM: full    Dental no notable dental hx.    Pulmonary neg sleep apnea, neg COPD, former smoker,    Pulmonary exam normal        Cardiovascular Exercise Tolerance: Good hypertension, On Medications (-) CAD, (-) Past MI, (-) Cardiac Stents and (-) CABG negative cardio ROS Normal cardiovascular exam Rhythm:regular Rate:Normal     Neuro/Psych neg Seizures CVA, No Residual Symptoms negative psych ROS   GI/Hepatic negative GI ROS, Neg liver ROS, GERD  Medicated,  Endo/Other  negative endocrine ROSneg diabetes  Renal/GU negative Renal ROS  negative genitourinary   Musculoskeletal   Abdominal (+) - obese,   Peds  Hematology negative hematology ROS (+)   Anesthesia Other Findings Past Medical History: No date: Cancer ([facility])     Comment:  esophageal No date: GERD (gastroesophageal reflux disease) No date: Hypertension No date: Prostate cancer ([facility]) 2020: Stroke ([facility])     Comment:  no wealness Past Surgical History: No date: broken bones repair 02/18/2018: ESOPHAGOGASTRODUODENOSCOPY (EGD) WITH PROPOFOL; N/A     Comment:  Procedure: ESOPHAGOGASTRODUODENOSCOPY (EGD) WITH               PROPOFOL;  Surgeon: Anna, Kiran, MD;  Location: [facility]               ENDOSCOPY;  Service: Gastroenterology;  Laterality: N/A; 04/01/2018: ESOPHAGOGASTRODUODENOSCOPY (EGD) WITH PROPOFOL; N/A     Comment:  Procedure: ESOPHAGOGASTRODUODENOSCOPY (EGD) WITH               PROPOFOL;  Surgeon: Anna, Kiran, MD;  Location: [facility]               ENDOSCOPY;  Service: Gastroenterology;   Laterality: N/A; 12/02/2018: ESOPHAGOGASTRODUODENOSCOPY (EGD) WITH PROPOFOL; N/A     Comment:  Procedure: ESOPHAGOGASTRODUODENOSCOPY (EGD) WITH               PROPOFOL with Dilation;  Surgeon: Anna, Kiran, MD;                Location: [facility] ENDOSCOPY;  Service: Gastroenterology;                Laterality: N/A; 05/19/2019: ESOPHAGOGASTRODUODENOSCOPY (EGD) WITH PROPOFOL; N/A     Comment:  Procedure: ESOPHAGOGASTRODUODENOSCOPY (EGD) WITH               PROPOFOL;  Surgeon: Anna, Kiran, MD;  Location: [facility]               ENDOSCOPY;  Service: Gastroenterology;  Laterality: N/A; 05/18/2019: ESOPHAGOGASTRODUODENOSCOPY (EGD) WITH PROPOFOL; N/A     Comment:  Procedure: ESOPHAGOGASTRODUODENOSCOPY (EGD) WITH               PROPOFOL with Dilation;  Surgeon: Anna, Kiran, MD;                Location: [facility] ENDOSCOPY;  Service: Gastroenterology;                Laterality: N/A; 06/25/2019: ESOPHAGOGASTRODUODENOSCOPY (EGD) WITH PROPOFOL; N/A     Comment:  Procedure: ESOPHAGOGASTRODUODENOSCOPY (EGD) WITH               PROPOFOL  with   Dilation;  Surgeon: Anna, Kiran, MD;                Location: [facility] ENDOSCOPY;  Service: Gastroenterology;                Laterality: N/A; 07/09/2019: ESOPHAGOGASTRODUODENOSCOPY (EGD) WITH PROPOFOL; N/A     Comment:  Procedure: ESOPHAGOGASTRODUODENOSCOPY (EGD) WITH               PROPOFOL with Dilation;  Surgeon: Anna, Kiran, MD;                Location: [facility] ENDOSCOPY;  Service: Gastroenterology;                Laterality: N/A;  Pt requests early morning 08/11/2019: ESOPHAGOGASTRODUODENOSCOPY (EGD) WITH PROPOFOL; N/A     Comment:  Procedure: ESOPHAGOGASTRODUODENOSCOPY (EGD) WITH               PROPOFOL with Dilation;  Surgeon: Anna, Kiran, MD;                Location: [facility] ENDOSCOPY;  Service: Gastroenterology;                Laterality: N/A; 09/11/2019: ESOPHAGOGASTRODUODENOSCOPY (EGD) WITH PROPOFOL; N/A     Comment:  Procedure: ESOPHAGOGASTRODUODENOSCOPY (EGD) WITH               PROPOFOL  with Dilation;  Surgeon: Anna, Kiran, MD;                Location: [facility] ENDOSCOPY;  Service: Gastroenterology;                Laterality: N/A; 10/02/2019: ESOPHAGOGASTRODUODENOSCOPY (EGD) WITH PROPOFOL; N/A     Comment:  Procedure: ESOPHAGOGASTRODUODENOSCOPY (EGD) WITH               PROPOFOL with Dilation;  Surgeon: Anna, Kiran, MD;                Location: [facility] ENDOSCOPY;  Service: Gastroenterology;                Laterality: N/A; No date: EYE SURGERY     Comment:  retinal detatchment No date: FRACTURE SURGERY No date: radical prostate No date: TONSILLECTOMY BMI    Body Mass Index: 28.48 kg/m     Reproductive/Obstetrics negative OB ROS                             Anesthesia Physical  Anesthesia Plan  ASA: III  Anesthesia Plan: General   Post-op Pain Management:    Induction: Intravenous  PONV Risk Score and Plan: 1 and Propofol infusion  Airway Management Planned: Natural Airway  Additional Equipment:   Intra-op Plan:   Post-operative Plan:   Informed Consent: I have reviewed the patients History and Physical, chart, labs and discussed the procedure including the risks, benefits and alternatives for the proposed anesthesia with the patient or authorized representative who has indicated his/her understanding and acceptance.     Dental Advisory Given  Plan Discussed with: CRNA  Anesthesia Plan Comments:         Anesthesia Quick Evaluation  

## 2019-11-04 NOTE — Op Note (Signed)
[facility] Gastroenterology Patient Name: Andrew Mullins Procedure Date: 11/04/2019 1:01 PM MRN: [id number] Account #: [id number] Date of Birth: 08-31-1955 Admit Type: Outpatient Age: 64 Room: [facility] ENDO ROOM 4 Gender: Male Note Status: Finalized Procedure:             Upper GI endoscopy Indications:           Dysphagia Providers:             [name] MD, MD Referring MD:          Baxter Hire, MD (Referring MD) Medicines:             Monitored Anesthesia Care Complications:         No immediate complications. Procedure:             Pre-Anesthesia Assessment:                        - Prior to the procedure, a History and Physical was                         performed, and patient medications, allergies and                         sensitivities were reviewed. The patient's tolerance                         of previous anesthesia was reviewed.                        - The risks and benefits of the procedure and the                         sedation options and risks were discussed with the                         patient. All questions were answered and informed                         consent was obtained.                        - ASA Grade Assessment: II - A patient with mild                         systemic disease.                        After obtaining informed consent, the endoscope was                         passed under direct vision. Throughout the procedure,                         the patient's blood pressure, pulse, and oxygen                         saturations were monitored continuously. The Endoscope                         was introduced through the mouth, and advanced  to the                         fourth part of duodenum. The upper GI endoscopy was                         accomplished with ease. The patient tolerated the                         procedure well. Findings:      The examined duodenum was normal.      The stomach was  normal.      The cardia and gastric fundus were normal on retroflexion.      One benign-appearing, intrinsic moderate (circumferential scarring or       stenosis; an endoscope may pass) stenosis was found in the upper third       of the esophagus. This stenosis measured 1.2 cm (inner diameter) x 1 cm       (in length). The stenosis was traversed. A TTS dilator was passed       through the scope. Dilation with a 12-13.5-15 mm balloon dilator was       performed to 15 mm. The dilation site was examined and showed moderate       mucosal disruption. Impression:            - Normal examined duodenum.                        - Normal stomach.                        - Benign-appearing esophageal stenosis. Dilated.                        - No specimens collected. Recommendation:        - Discharge patient to home (with escort).                        - Resume previous diet.                        - Continue present medications.                        - Repeat upper endoscopy in 4 weeks for retreatment. Procedure Code(s):     --- Professional ---                        [id number], Esophagogastroduodenoscopy, flexible,                         transoral; with transendoscopic balloon dilation of                         esophagus (less than 30 mm diameter) Diagnosis Code(s):     --- Professional ---                        K22.2, Esophageal obstruction                        R13.10, Dysphagia, unspecified CPT copyright 2019 American Medical Association. All rights reserved. The  codes documented in this report are preliminary and upon coder review may  be revised to meet current compliance requirements. [name], MD [name] MD, MD 11/04/2019 1:31:32 PM This report has been signed electronically. Number of Addenda: 0 Note Initiated On: 11/04/2019 1:01 PM Estimated Blood Loss:  Estimated blood loss: none.      [facility]

## 2019-11-05 NOTE — Anesthesia Postprocedure Evaluation (Signed)
Anesthesia Post Note  Patient: ALSON MCPHEETERS  Procedure(s) Performed: ESOPHAGOGASTRODUODENOSCOPY (EGD) WITH PROPOFOL (N/A )  Patient location during evaluation: PACU Anesthesia Type: General Level of consciousness: awake and alert Pain management: pain level controlled Vital Signs Assessment: post-procedure vital signs reviewed and stable Respiratory status: spontaneous breathing, nonlabored ventilation, respiratory function stable and patient connected to nasal cannula oxygen Cardiovascular status: blood pressure returned to baseline and stable Postop Assessment: no apparent nausea or vomiting Anesthetic complications: no   No complications documented.   Last Vitals:  Vitals:   11/04/19 1350 11/04/19 1400  BP: (!) 145/87 (!) 151/81  Pulse: (!) 58 (!) 57  Resp: 18 17  Temp:    SpO2: 99% 98%    Last Pain:  Vitals:   11/04/19 1400  TempSrc:   PainSc: 0-No pain                 Molli Barrows

## 2019-11-06 ENCOUNTER — Encounter: Payer: Self-pay | Admitting: Gastroenterology

## 2019-11-16 ENCOUNTER — Other Ambulatory Visit: Payer: Self-pay

## 2019-11-16 DIAGNOSIS — R1310 Dysphagia, unspecified: Secondary | ICD-10-CM

## 2019-11-16 DIAGNOSIS — K222 Esophageal obstruction: Secondary | ICD-10-CM

## 2019-11-27 ENCOUNTER — Ambulatory Visit: Admit: 2019-11-27 | Payer: BC Managed Care – PPO | Admitting: Gastroenterology

## 2019-11-27 SURGERY — ESOPHAGOGASTRODUODENOSCOPY (EGD) WITH PROPOFOL
Anesthesia: General

## 2020-01-27 ENCOUNTER — Telehealth: Payer: Self-pay | Admitting: Gastroenterology

## 2020-01-27 NOTE — Telephone Encounter (Signed)
Needs to schedule for Endoscopy Dialation, please call to schedule

## 2020-01-28 NOTE — Telephone Encounter (Signed)
Patient lvm requesting to be scheduled for an EGD wi/Dilation ASAP with Dr. Vicente Males.  Patient had an appt with [facility] 01/21/20.  I have not contacted patient yet to discuss.  Reviewing chart he has history of Dysphagia.  Dr. Vicente Males please review patients chart to advise on scheduling EGD.  Thanks,  [place], [place]

## 2020-01-29 NOTE — Telephone Encounter (Signed)
I have spoken to the Dr at [facility] and am aware of what he undewent-schedule for dilation and EGD

## 2020-02-01 ENCOUNTER — Other Ambulatory Visit: Payer: Self-pay

## 2020-02-01 DIAGNOSIS — R1310 Dysphagia, unspecified: Secondary | ICD-10-CM

## 2020-02-03 ENCOUNTER — Other Ambulatory Visit: Payer: Self-pay

## 2020-02-03 ENCOUNTER — Other Ambulatory Visit
Admission: RE | Admit: 2020-02-03 | Discharge: 2020-02-03 | Disposition: A | Discharge status: Home / Self Care | Payer: BC Managed Care – PPO | Source: Ambulatory Visit | Attending: Gastroenterology | Admitting: Gastroenterology

## 2020-02-03 DIAGNOSIS — Z20822 Contact with and (suspected) exposure to covid-19: Secondary | ICD-10-CM | POA: Insufficient documentation

## 2020-02-03 DIAGNOSIS — R1310 Dysphagia, unspecified: Secondary | ICD-10-CM | POA: Diagnosis present

## 2020-02-03 DIAGNOSIS — Z79899 Other long term (current) drug therapy: Secondary | ICD-10-CM | POA: Diagnosis not present

## 2020-02-03 DIAGNOSIS — Z8546 Personal history of malignant neoplasm of prostate: Secondary | ICD-10-CM | POA: Diagnosis not present

## 2020-02-03 DIAGNOSIS — Z87891 Personal history of nicotine dependence: Secondary | ICD-10-CM | POA: Diagnosis not present

## 2020-02-03 DIAGNOSIS — Z01812 Encounter for preprocedural laboratory examination: Secondary | ICD-10-CM | POA: Insufficient documentation

## 2020-02-03 DIAGNOSIS — Z8673 Personal history of transient ischemic attack (TIA), and cerebral infarction without residual deficits: Secondary | ICD-10-CM | POA: Diagnosis not present

## 2020-02-03 DIAGNOSIS — K222 Esophageal obstruction: Secondary | ICD-10-CM | POA: Diagnosis not present

## 2020-02-03 DIAGNOSIS — Z7982 Long term (current) use of aspirin: Secondary | ICD-10-CM | POA: Diagnosis not present

## 2020-02-03 LAB — SARS CORONAVIRUS 2 (TAT 6-24 HRS): SARS Coronavirus 2: NEGATIVE

## 2020-02-04 ENCOUNTER — Encounter: Payer: Self-pay | Admitting: Gastroenterology

## 2020-02-05 ENCOUNTER — Ambulatory Visit: Payer: BC Managed Care – PPO | Admitting: Certified Registered Nurse Anesthetist

## 2020-02-05 ENCOUNTER — Encounter
Admission: RE | Disposition: A | Discharge status: Home / Self Care | Payer: Self-pay | Source: Home / Self Care | Attending: Gastroenterology

## 2020-02-05 ENCOUNTER — Encounter: Payer: Self-pay | Admitting: Gastroenterology

## 2020-02-05 ENCOUNTER — Ambulatory Visit
Admission: RE | Admit: 2020-02-05 | Discharge: 2020-02-05 | Disposition: A | Discharge status: Home / Self Care | Payer: BC Managed Care – PPO | Attending: Gastroenterology | Admitting: Gastroenterology

## 2020-02-05 DIAGNOSIS — Z01812 Encounter for preprocedural laboratory examination: Secondary | ICD-10-CM | POA: Insufficient documentation

## 2020-02-05 DIAGNOSIS — Z79899 Other long term (current) drug therapy: Secondary | ICD-10-CM | POA: Insufficient documentation

## 2020-02-05 DIAGNOSIS — Z7982 Long term (current) use of aspirin: Secondary | ICD-10-CM | POA: Insufficient documentation

## 2020-02-05 DIAGNOSIS — R1310 Dysphagia, unspecified: Secondary | ICD-10-CM | POA: Diagnosis not present

## 2020-02-05 DIAGNOSIS — K222 Esophageal obstruction: Secondary | ICD-10-CM | POA: Insufficient documentation

## 2020-02-05 DIAGNOSIS — Z8546 Personal history of malignant neoplasm of prostate: Secondary | ICD-10-CM | POA: Insufficient documentation

## 2020-02-05 DIAGNOSIS — Z87891 Personal history of nicotine dependence: Secondary | ICD-10-CM | POA: Insufficient documentation

## 2020-02-05 DIAGNOSIS — Z20822 Contact with and (suspected) exposure to covid-19: Secondary | ICD-10-CM | POA: Insufficient documentation

## 2020-02-05 DIAGNOSIS — Z8673 Personal history of transient ischemic attack (TIA), and cerebral infarction without residual deficits: Secondary | ICD-10-CM | POA: Insufficient documentation

## 2020-02-05 HISTORY — PX: ESOPHAGOGASTRODUODENOSCOPY (EGD) WITH PROPOFOL: SHX5813

## 2020-02-05 SURGERY — ESOPHAGOGASTRODUODENOSCOPY (EGD) WITH PROPOFOL
Anesthesia: General

## 2020-02-05 MED ORDER — LIDOCAINE HCL (CARDIAC) PF 100 MG/5ML IV SOSY
PREFILLED_SYRINGE | INTRAVENOUS | Status: DC | PRN
  Administered 2020-02-05: 100 mg via INTRAVENOUS

## 2020-02-05 MED ORDER — LIDOCAINE HCL (PF) 2 % IJ SOLN
INTRAMUSCULAR | Status: AC
  Filled 2020-02-05: qty 5

## 2020-02-05 MED ORDER — PROPOFOL 500 MG/50ML IV EMUL
INTRAVENOUS | Status: AC
  Filled 2020-02-05: qty 50

## 2020-02-05 MED ORDER — SODIUM CHLORIDE 0.9 % IV SOLN: INTRAVENOUS | Status: DC

## 2020-02-05 MED ORDER — PROPOFOL 500 MG/50ML IV EMUL
INTRAVENOUS | Status: DC | PRN
  Administered 2020-02-05: 150 ug/kg/min via INTRAVENOUS

## 2020-02-05 MED ORDER — PROPOFOL 10 MG/ML IV BOLUS
INTRAVENOUS | Status: DC | PRN
  Administered 2020-02-05 (×2): 20 mg via INTRAVENOUS
  Administered 2020-02-05: 60 mg via INTRAVENOUS

## 2020-02-05 NOTE — Anesthesia Preprocedure Evaluation (Signed)
Anesthesia Evaluation  Patient identified by MRN, date of birth, ID band Patient awake    Reviewed: Allergy & Precautions, H&P , NPO status , Patient's Chart, lab work & pertinent test results  History of Anesthesia Complications (+) PROLONGED EMERGENCE and history of anesthetic complications  Airway Mallampati: III  TM Distance: <3 FB Neck ROM: limited    Dental  (+) Chipped   Pulmonary neg pulmonary ROS, neg shortness of breath, former smoker,           Cardiovascular Exercise Tolerance: Good hypertension, (-) angina(-) Past MI and (-) DOE      Neuro/Psych CVA negative psych ROS   GI/Hepatic Neg liver ROS, GERD  Medicated and Controlled,  Endo/Other  negative endocrine ROS  Renal/GU negative Renal ROS  negative genitourinary   Musculoskeletal   Abdominal   Peds  Hematology negative hematology ROS (+)   Anesthesia Other Findings Past Medical History: No date: Cancer ([place])     Comment:  esophageal No date: GERD (gastroesophageal reflux disease) No date: Hypertension  Past Surgical History: No date: broken bones repair 02/18/2018: ESOPHAGOGASTRODUODENOSCOPY (EGD) WITH PROPOFOL; N/A     Comment:  Procedure: ESOPHAGOGASTRODUODENOSCOPY (EGD) WITH               PROPOFOL;  Surgeon: [name], MD;  Location: [facility]               ENDOSCOPY;  Service: Gastroenterology;  Laterality: N/A; 04/01/2018: ESOPHAGOGASTRODUODENOSCOPY (EGD) WITH PROPOFOL; N/A     Comment:  Procedure: ESOPHAGOGASTRODUODENOSCOPY (EGD) WITH               PROPOFOL;  Surgeon: [name], MD;  Location: [facility]               ENDOSCOPY;  Service: Gastroenterology;  Laterality: N/A; No date: EYE SURGERY     Comment:  retinal detatchment No date: FRACTURE SURGERY No date: radical prostate No date: TONSILLECTOMY  BMI    Body Mass Index: 29.16 kg/m      Reproductive/Obstetrics negative OB ROS                              Anesthesia Physical  Anesthesia Plan  ASA: III  Anesthesia Plan: General   Post-op Pain Management:    Induction: Intravenous  PONV Risk Score and Plan: Propofol infusion and TIVA  Airway Management Planned: Natural Airway and Nasal Cannula  Additional Equipment:   Intra-op Plan:   Post-operative Plan:   Informed Consent: I have reviewed the patients History and Physical, chart, labs and discussed the procedure including the risks, benefits and alternatives for the proposed anesthesia with the patient or authorized representative who has indicated his/her understanding and acceptance.     Dental Advisory Given  Plan Discussed with: Anesthesiologist, CRNA and Surgeon  Anesthesia Plan Comments: (Patient consented for risk of stroke or re stroke.  He voiced understanding.  Patient consented for risks of anesthesia including but not limited to:  - adverse reactions to medications - risk of airway placement if required - damage to eyes, teeth, lips or other oral mucosa - nerve damage due to positioning  - sore throat or hoarseness - Damage to heart, brain, nerves, lungs, other parts of body or loss of life  Patient voiced understanding.)        Anesthesia Quick Evaluation

## 2020-02-05 NOTE — Anesthesia Postprocedure Evaluation (Signed)
Anesthesia Post Note  Patient: Andrew Mullins  Procedure(s) Performed: ESOPHAGOGASTRODUODENOSCOPY (EGD) WITH PROPOFOL (N/A )  Patient location during evaluation: Endoscopy Anesthesia Type: General Level of consciousness: awake and alert Pain management: pain level controlled Vital Signs Assessment: post-procedure vital signs reviewed and stable Respiratory status: spontaneous breathing, nonlabored ventilation, respiratory function stable and patient connected to nasal cannula oxygen Cardiovascular status: blood pressure returned to baseline and stable Postop Assessment: no apparent nausea or vomiting Anesthetic complications: no   No complications documented.   Last Vitals:  Vitals:   02/05/20 0858 02/05/20 0928  BP: (!) 134/95 (!) 161/91  Pulse: 68   Resp:    Temp: (!) 35.9 C   SpO2: 97%     Last Pain:  Vitals:   02/05/20 0928  TempSrc:   PainSc: 1                  Precious Haws Keilana Morlock

## 2020-02-05 NOTE — Transfer of Care (Signed)
Immediate Anesthesia Transfer of Care Note  Patient: Andrew Mullins  Procedure(s) Performed: ESOPHAGOGASTRODUODENOSCOPY (EGD) WITH PROPOFOL (N/A )  Patient Location: PACU  Anesthesia Type:General  Level of Consciousness: drowsy  Airway & Oxygen Therapy: Patient Spontanous Breathing and Patient connected to nasal cannula oxygen  Post-op Assessment: Report given to RN and Post -op Vital signs reviewed and stable  Post vital signs: Reviewed and stable  Last Vitals:  Vitals Value Taken Time  BP    Temp    Pulse    Resp    SpO2      Last Pain:  Vitals:   02/05/20 0738  TempSrc: Temporal  PainSc: 0-No pain         Complications: No complications documented.

## 2020-02-05 NOTE — H&P (Signed)
[name], MD [address], [place], [place], [place], [address] [address], [place], [place], [place], [address] Phone: [phone]  Fax: [phone]  Primary Care Physician:  Baxter Hire, MD   Pre-Procedure History & Physical: HPI:  Andrew Mullins is a 65 y.o. male is here for an endoscopy    Past Medical History:  Diagnosis Date  . 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 GERD (gastroesophageal reflux disease)   . Hypertension   . Prostate cancer ([place])   . Stroke ([place]) 2020   no wealness    Past Surgical History:  Procedure Laterality Date  . broken bones repair    . ESOPHAGOGASTRODUODENOSCOPY (EGD) WITH PROPOFOL N/A 02/18/2018   Procedure: ESOPHAGOGASTRODUODENOSCOPY (EGD) WITH PROPOFOL;  Surgeon: [name], MD;  Location: [facility] ENDOSCOPY;  Service: Gastroenterology;  Laterality: N/A;  . ESOPHAGOGASTRODUODENOSCOPY (EGD) WITH PROPOFOL N/A 04/01/2018   Procedure: ESOPHAGOGASTRODUODENOSCOPY (EGD) WITH PROPOFOL;  Surgeon: [name], MD;  Location: [facility] ENDOSCOPY;  Service: Gastroenterology;  Laterality: N/A;  . ESOPHAGOGASTRODUODENOSCOPY (EGD) WITH PROPOFOL N/A 12/02/2018   Procedure: ESOPHAGOGASTRODUODENOSCOPY (EGD) WITH PROPOFOL with Dilation;  Surgeon: [name], MD;  Location: [facility] ENDOSCOPY;  Service: Gastroenterology;  Laterality: N/A;  . ESOPHAGOGASTRODUODENOSCOPY (EGD) WITH PROPOFOL N/A 05/19/2019   Procedure: ESOPHAGOGASTRODUODENOSCOPY (EGD) WITH PROPOFOL;  Surgeon: [name], MD;  Location: [facility] ENDOSCOPY;  Service: Gastroenterology;  Laterality: N/A;  . ESOPHAGOGASTRODUODENOSCOPY (EGD) WITH PROPOFOL N/A 05/18/2019   Procedure: ESOPHAGOGASTRODUODENOSCOPY (EGD) WITH PROPOFOL with Dilation;  Surgeon: [name], MD;  Location: [facility] ENDOSCOPY;  Service: Gastroenterology;  Laterality: N/A;  . ESOPHAGOGASTRODUODENOSCOPY (EGD) WITH PROPOFOL N/A 06/25/2019   Procedure: ESOPHAGOGASTRODUODENOSCOPY (EGD) WITH PROPOFOL  with Dilation;  Surgeon: [name], MD;   Location: [facility] ENDOSCOPY;  Service: Gastroenterology;  Laterality: N/A;  . ESOPHAGOGASTRODUODENOSCOPY (EGD) WITH PROPOFOL N/A 07/09/2019   Procedure: ESOPHAGOGASTRODUODENOSCOPY (EGD) WITH PROPOFOL with Dilation;  Surgeon: [name], MD;  Location: [facility] ENDOSCOPY;  Service: Gastroenterology;  Laterality: N/A;  Pt requests early morning  . ESOPHAGOGASTRODUODENOSCOPY (EGD) WITH PROPOFOL N/A 08/11/2019   Procedure: ESOPHAGOGASTRODUODENOSCOPY (EGD) WITH PROPOFOL with Dilation;  Surgeon: [name], MD;  Location: [facility] ENDOSCOPY;  Service: Gastroenterology;  Laterality: N/A;  . ESOPHAGOGASTRODUODENOSCOPY (EGD) WITH PROPOFOL N/A 09/11/2019   Procedure: ESOPHAGOGASTRODUODENOSCOPY (EGD) WITH PROPOFOL with Dilation;  Surgeon: [name], MD;  Location: [facility] ENDOSCOPY;  Service: Gastroenterology;  Laterality: N/A;  . ESOPHAGOGASTRODUODENOSCOPY (EGD) WITH PROPOFOL N/A 10/02/2019   Procedure: ESOPHAGOGASTRODUODENOSCOPY (EGD) WITH PROPOFOL with Dilation;  Surgeon: [name], MD;  Location: [facility] ENDOSCOPY;  Service: Gastroenterology;  Laterality: N/A;  . ESOPHAGOGASTRODUODENOSCOPY (EGD) WITH PROPOFOL N/A 11/04/2019   Procedure: ESOPHAGOGASTRODUODENOSCOPY (EGD) WITH PROPOFOL;  Surgeon: [name], MD;  Location: [facility] ENDOSCOPY;  Service: Gastroenterology;  Laterality: N/A;  . EYE SURGERY     retinal detatchment  . FRACTURE SURGERY    . radical prostate    . TONSILLECTOMY      Prior to Admission medications   Medication Sig Start Date End Date Taking? Authorizing Provider  metoprolol tartrate (LOPRESSOR) 25 MG tablet Take 25 mg by mouth 2 (two) times daily.   Yes [provider]  aspirin EC 325 MG EC tablet Take 1 tablet (325 mg total) by mouth daily. 09/19/18   Fritzi Mandes, MD  atorvastatin (LIPITOR) 40 MG tablet Take 1 tablet (40 mg total) by mouth daily at 6 PM. 09/18/18   Fritzi Mandes, MD  famciclovir (FAMVIR) 250 MG tablet Take 250 mg by mouth 2 (two) times daily.  [provider]   losartan (COZAAR) 50 MG tablet Take 50 mg by mouth daily. 08/20/18   [provider]  omeprazole (PRILOSEC) 20 MG capsule Take 20 mg by mouth daily.    [provider]    Allergies as of 02/02/2020  . (No Known Allergies)    History reviewed. No pertinent family history.  Social History   Socioeconomic History  . Marital status: Married    Spouse name: Not on file  . Number of children: Not on file  . Years of education: Not on file  . Highest education level: Not on file  Occupational History  . Not on file  Tobacco Use  . Smoking status: Former Research scientist (life sciences)  . Smokeless tobacco: Never Used  Vaping Use  . Vaping Use: Never used  Substance and Sexual Activity  . Alcohol use: Never  . Drug use: Never  . Sexual activity: Not on file  Other Topics Concern  . Not on file  Social History Narrative   Lives at home with wife   Social Determinants of Health   Financial Resource Strain: Not on file  Food Insecurity: Not on file  Transportation Needs: Not on file  Physical Activity: Not on file  Stress: Not on file  Social Connections: Not on file  Intimate Partner Violence: Not on file    Review of Systems: See HPI, otherwise negative ROS  Physical Exam: BP (!) 160/95   Pulse 69   Temp (!) 97.3 F (36.3 C) (Temporal)   Resp 18   Ht 6' (1.829 m)   Wt 90.7 kg   SpO2 100%   BMI 27.12 kg/m  General:   Alert,  pleasant and cooperative in NAD Head:  Normocephalic and atraumatic. Neck:  Supple; no masses or thyromegaly. Lungs:  Clear throughout to auscultation, normal respiratory effort.    Heart:  +S1, +S2, Regular rate and rhythm, No edema. Abdomen:  Soft, nontender and nondistended. Normal bowel sounds, without guarding, and without rebound.   Neurologic:  Alert and  oriented x4;  grossly normal neurologically.  Impression/Plan: Andrew Mullins is here for an endoscopy  to be performed for  evaluation of dysphagia, esophageal stent has not seemed  to have worked    Risks, benefits, limitations, and alternatives regarding endoscopy have been reviewed with the patient.  Questions have been answered.  All parties agreeable.   [name], MD  02/05/2020, 8:40 AM

## 2020-02-05 NOTE — Op Note (Signed)
[facility] Patient Name: Andrew Mullins Procedure Date: 02/05/2020 8:42 AM MRN: [id number] Account #: [id number] Date of Birth: [birth year] Admit Type: Outpatient Age: 65 Room: [facility] ENDO ROOM 4 Gender: Male Note Status: Finalized Procedure:             Upper GI endoscopy Indications:           Dysphagia Providers:             [name] MD, MD Referring MD:          Andres Labrum, MD (Referring MD) Medicines:             Monitored Anesthesia Care Complications:         No immediate complications. Procedure:             Pre-Anesthesia Assessment:                        - Prior to the procedure, a History and Physical was                         performed, and patient medications, allergies and                         sensitivities were reviewed. The patient's tolerance                         of previous anesthesia was reviewed.                        - The risks and benefits of the procedure and the                         sedation options and risks were discussed with the                         patient. All questions were answered and informed                         consent was obtained.                        - ASA Grade Assessment: II - A patient with mild                         systemic disease.                        After obtaining informed consent, the endoscope was                         passed under direct vision. Throughout the procedure,                         the patient's blood pressure, pulse, and oxygen                         saturations were monitored continuously. The Endoscope                         was introduced through the mouth, and advanced  to the                         middle third of esophagus. The upper GI endoscopy was                         accomplished without difficulty. The patient tolerated                         the procedure well. Findings:      One benign-appearing, intrinsic severe (stenosis; an  endoscope cannot       pass) stenosis was found in the upper third of the esophagus. This       stenosis measured 7 mm (inner diameter) x 1 cm (in length). A TTS       dilator was passed through the scope. Dilation with an 08-23-08 mm balloon       and a 10-26-10 mm balloon dilator was performed to 11 mm. The dilation       site was examined following endoscope reinsertion and showed moderate       mucosal disruption. Impression:            - Benign-appearing esophageal stenosis. Dilated.                        - No specimens collected. Recommendation:        - Discharge patient to home (with escort).                        - Mechanical soft diet.                        - Continue present medications.                        - Repeat upper endoscopy in 2 weeks for retreatment. Procedure Code(s):     --- Professional ---                        [phone], Esophagoscopy, flexible, transoral; with                         transendoscopic balloon dilation (less than 30 mm                         diameter) Diagnosis Code(s):     --- Professional ---                        K22.2, Esophageal obstruction                        R13.10, Dysphagia, unspecified CPT copyright 2019 American Medical Association. All rights reserved. The codes documented in this report are preliminary and upon coder review may  be revised to meet current compliance requirements. [name], MD [name] MD, MD 02/05/2020 8:57:16 AM This report has been signed electronically. Number of Addenda: 0 Note Initiated On: 02/05/2020 8:42 AM      [facility]

## 2020-02-08 ENCOUNTER — Encounter: Payer: Self-pay | Admitting: Gastroenterology

## 2020-02-08 ENCOUNTER — Other Ambulatory Visit: Payer: Self-pay

## 2020-02-12 ENCOUNTER — Telehealth: Payer: Self-pay | Admitting: Gastroenterology

## 2020-02-12 NOTE — Telephone Encounter (Signed)
Patient needs to schedule EGD for Friday Feb 19, 2020 per Dr Vicente Males

## 2020-02-12 NOTE — Telephone Encounter (Signed)
Patient just had EGD on 02/05/20. Are we scheduling another one for 02/19/20? Please advise.

## 2020-02-15 NOTE — Telephone Encounter (Signed)
Patient called back to schedue, please return call

## 2020-02-15 NOTE — Telephone Encounter (Signed)
Yes please schedule. 

## 2020-02-15 NOTE — Telephone Encounter (Signed)
Called patient to schedule procedure. LVM to call office back.

## 2020-02-16 ENCOUNTER — Other Ambulatory Visit: Payer: Self-pay

## 2020-02-16 DIAGNOSIS — K222 Esophageal obstruction: Secondary | ICD-10-CM | POA: Insufficient documentation

## 2020-02-16 DIAGNOSIS — R1310 Dysphagia, unspecified: Secondary | ICD-10-CM | POA: Insufficient documentation

## 2020-02-16 NOTE — Progress Notes (Signed)
Procedure has been scheduled per Dr Vicente Males. Instructions sent via my chart.

## 2020-02-17 ENCOUNTER — Other Ambulatory Visit: Payer: Self-pay

## 2020-02-17 ENCOUNTER — Other Ambulatory Visit
Admission: RE | Admit: 2020-02-17 | Discharge: 2020-02-17 | Disposition: A | Discharge status: Home / Self Care | Payer: BC Managed Care – PPO | Source: Ambulatory Visit | Attending: Gastroenterology | Admitting: Gastroenterology

## 2020-02-17 DIAGNOSIS — Z01812 Encounter for preprocedural laboratory examination: Secondary | ICD-10-CM | POA: Insufficient documentation

## 2020-02-17 DIAGNOSIS — Z87891 Personal history of nicotine dependence: Secondary | ICD-10-CM | POA: Diagnosis not present

## 2020-02-17 DIAGNOSIS — Z8501 Personal history of malignant neoplasm of esophagus: Secondary | ICD-10-CM | POA: Diagnosis not present

## 2020-02-17 DIAGNOSIS — Z79899 Other long term (current) drug therapy: Secondary | ICD-10-CM | POA: Diagnosis not present

## 2020-02-17 DIAGNOSIS — R1310 Dysphagia, unspecified: Secondary | ICD-10-CM | POA: Diagnosis present

## 2020-02-17 DIAGNOSIS — Z20822 Contact with and (suspected) exposure to covid-19: Secondary | ICD-10-CM | POA: Insufficient documentation

## 2020-02-17 DIAGNOSIS — K222 Esophageal obstruction: Secondary | ICD-10-CM | POA: Diagnosis not present

## 2020-02-17 LAB — SARS CORONAVIRUS 2 (TAT 6-24 HRS): SARS Coronavirus 2: NEGATIVE

## 2020-02-19 ENCOUNTER — Encounter
Admission: RE | Disposition: A | Discharge status: Home / Self Care | Payer: Self-pay | Source: Home / Self Care | Attending: Gastroenterology

## 2020-02-19 ENCOUNTER — Ambulatory Visit: Payer: BC Managed Care – PPO | Admitting: Anesthesiology

## 2020-02-19 ENCOUNTER — Encounter: Payer: Self-pay | Admitting: Gastroenterology

## 2020-02-19 ENCOUNTER — Ambulatory Visit
Admission: RE | Admit: 2020-02-19 | Discharge: 2020-02-19 | Disposition: A | Discharge status: Home / Self Care | Payer: BC Managed Care – PPO | Attending: Gastroenterology | Admitting: Gastroenterology

## 2020-02-19 DIAGNOSIS — K222 Esophageal obstruction: Secondary | ICD-10-CM | POA: Diagnosis not present

## 2020-02-19 DIAGNOSIS — R1310 Dysphagia, unspecified: Secondary | ICD-10-CM | POA: Insufficient documentation

## 2020-02-19 DIAGNOSIS — Z79899 Other long term (current) drug therapy: Secondary | ICD-10-CM | POA: Insufficient documentation

## 2020-02-19 DIAGNOSIS — Z20822 Contact with and (suspected) exposure to covid-19: Secondary | ICD-10-CM | POA: Insufficient documentation

## 2020-02-19 DIAGNOSIS — Z87891 Personal history of nicotine dependence: Secondary | ICD-10-CM | POA: Insufficient documentation

## 2020-02-19 DIAGNOSIS — Z8501 Personal history of malignant neoplasm of esophagus: Secondary | ICD-10-CM | POA: Insufficient documentation

## 2020-02-19 HISTORY — PX: ESOPHAGOGASTRODUODENOSCOPY (EGD) WITH PROPOFOL: SHX5813

## 2020-02-19 SURGERY — ESOPHAGOGASTRODUODENOSCOPY (EGD) WITH PROPOFOL
Anesthesia: General

## 2020-02-19 MED ORDER — LIDOCAINE HCL (PF) 2 % IJ SOLN
INTRAMUSCULAR | Status: AC
  Filled 2020-02-19: qty 5

## 2020-02-19 MED ORDER — LIDOCAINE HCL (CARDIAC) PF 100 MG/5ML IV SOSY
PREFILLED_SYRINGE | INTRAVENOUS | Status: DC | PRN
  Administered 2020-02-19: 50 mg via INTRAVENOUS

## 2020-02-19 MED ORDER — PROPOFOL 500 MG/50ML IV EMUL
INTRAVENOUS | Status: DC | PRN
  Administered 2020-02-19: 150 ug/kg/min via INTRAVENOUS

## 2020-02-19 MED ORDER — PROPOFOL 10 MG/ML IV BOLUS
INTRAVENOUS | Status: DC | PRN
  Administered 2020-02-19: 80 mg via INTRAVENOUS

## 2020-02-19 MED ORDER — SODIUM CHLORIDE 0.9 % IV SOLN: INTRAVENOUS | Status: DC

## 2020-02-19 MED ORDER — PROPOFOL 500 MG/50ML IV EMUL
INTRAVENOUS | Status: AC
  Filled 2020-02-19: qty 50

## 2020-02-19 NOTE — Anesthesia Preprocedure Evaluation (Signed)
Anesthesia Evaluation  Patient identified by MRN, date of birth, ID band Patient awake    Reviewed: Allergy & Precautions, NPO status , Patient's Chart, lab work & pertinent test results  History of Anesthesia Complications Negative for: history of anesthetic complications  Airway Mallampati: II       Dental   Pulmonary neg sleep apnea, neg COPD, Not current smoker, former smoker,           Cardiovascular hypertension, Pt. on medications and Pt. on home beta blockers (-) Past MI and (-) CHF (-) dysrhythmias (-) Valvular Problems/Murmurs     Neuro/Psych neg Seizures CVA (R sided weakness, resolved within a week), No Residual Symptoms    GI/Hepatic Neg liver ROS, GERD  Medicated and Controlled,  Endo/Other  neg diabetes  Renal/GU negative Renal ROS     Musculoskeletal   Abdominal   Peds  Hematology   Anesthesia Other Findings   Reproductive/Obstetrics                             Anesthesia Physical Anesthesia Plan  ASA: III  Anesthesia Plan: General   Post-op Pain Management:    Induction: Intravenous  PONV Risk Score and Plan: 2 and Propofol infusion and TIVA  Airway Management Planned: Nasal Cannula  Additional Equipment:   Intra-op Plan:   Post-operative Plan:   Informed Consent: I have reviewed the patients History and Physical, chart, labs and discussed the procedure including the risks, benefits and alternatives for the proposed anesthesia with the patient or authorized representative who has indicated his/her understanding and acceptance.       Plan Discussed with:   Anesthesia Plan Comments:         Anesthesia Quick Evaluation

## 2020-02-19 NOTE — Anesthesia Procedure Notes (Signed)
Date/Time: 02/19/2020 11:13 AM Performed by: Johnna Acosta, CRNA Pre-anesthesia Checklist: Patient identified, Emergency Drugs available, Suction available, Patient being monitored and Timeout performed Patient Re-evaluated:Patient Re-evaluated prior to induction Oxygen Delivery Method: Nasal cannula Preoxygenation: Pre-oxygenation with 100% oxygen Induction Type: IV induction

## 2020-02-19 NOTE — Anesthesia Postprocedure Evaluation (Signed)
Anesthesia Post Note  Patient: STEELE Mullins  Procedure(s) Performed: ESOPHAGOGASTRODUODENOSCOPY (EGD) WITH PROPOFOL (N/A )  Patient location during evaluation: Endoscopy Anesthesia Type: General Level of consciousness: awake and alert Pain management: pain level controlled Vital Signs Assessment: post-procedure vital signs reviewed and stable Respiratory status: spontaneous breathing, nonlabored ventilation, respiratory function stable and patient connected to nasal cannula oxygen Cardiovascular status: blood pressure returned to baseline and stable Postop Assessment: no apparent nausea or vomiting Anesthetic complications: no   No complications documented.   Last Vitals:  Vitals:   02/19/20 1150 02/19/20 1200  BP: (!) 154/90 (!) 156/86  Pulse:    Resp:    Temp:    SpO2:      Last Pain:  Vitals:   02/19/20 1200  TempSrc:   PainSc: 1                  Martha Clan

## 2020-02-19 NOTE — H&P (Signed)
[name], MD [address], [place], [place], [place], [address] [address] [place], [place], [place], [address] Phone: [phone]  Fax: [phone]  Primary Care Physician:  Baxter Hire, MD   Pre-Procedure History & Physical: HPI:  Andrew Mullins is a 65 y.o. male is here for an endoscopy    Past Medical History:  Diagnosis Date  . 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 GERD (gastroesophageal reflux disease)   . Hypertension   . Prostate cancer ([place])   . Stroke ([place]) 2020   no wealness    Past Surgical History:  Procedure Laterality Date  . broken bones repair    . ESOPHAGOGASTRODUODENOSCOPY (EGD) WITH PROPOFOL N/A 02/18/2018   Procedure: ESOPHAGOGASTRODUODENOSCOPY (EGD) WITH PROPOFOL;  Surgeon: [name], MD;  Location: [facility] ENDOSCOPY;  Service: Gastroenterology;  Laterality: N/A;  . ESOPHAGOGASTRODUODENOSCOPY (EGD) WITH PROPOFOL N/A 04/01/2018   Procedure: ESOPHAGOGASTRODUODENOSCOPY (EGD) WITH PROPOFOL;  Surgeon: [name], MD;  Location: [facility] ENDOSCOPY;  Service: Gastroenterology;  Laterality: N/A;  . ESOPHAGOGASTRODUODENOSCOPY (EGD) WITH PROPOFOL N/A 12/02/2018   Procedure: ESOPHAGOGASTRODUODENOSCOPY (EGD) WITH PROPOFOL with Dilation;  Surgeon: [name], MD;  Location: [facility] ENDOSCOPY;  Service: Gastroenterology;  Laterality: N/A;  . ESOPHAGOGASTRODUODENOSCOPY (EGD) WITH PROPOFOL N/A 05/19/2019   Procedure: ESOPHAGOGASTRODUODENOSCOPY (EGD) WITH PROPOFOL;  Surgeon: [name], MD;  Location: [facility] ENDOSCOPY;  Service: Gastroenterology;  Laterality: N/A;  . ESOPHAGOGASTRODUODENOSCOPY (EGD) WITH PROPOFOL N/A 05/18/2019   Procedure: ESOPHAGOGASTRODUODENOSCOPY (EGD) WITH PROPOFOL with Dilation;  Surgeon: [name], MD;  Location: [facility] ENDOSCOPY;  Service: Gastroenterology;  Laterality: N/A;  . ESOPHAGOGASTRODUODENOSCOPY (EGD) WITH PROPOFOL N/A 06/25/2019   Procedure: ESOPHAGOGASTRODUODENOSCOPY (EGD) WITH PROPOFOL  with Dilation;  Surgeon: [name], MD;   Location: [facility] ENDOSCOPY;  Service: Gastroenterology;  Laterality: N/A;  . ESOPHAGOGASTRODUODENOSCOPY (EGD) WITH PROPOFOL N/A 07/09/2019   Procedure: ESOPHAGOGASTRODUODENOSCOPY (EGD) WITH PROPOFOL with Dilation;  Surgeon: [name], MD;  Location: [facility] ENDOSCOPY;  Service: Gastroenterology;  Laterality: N/A;  Pt requests early morning  . ESOPHAGOGASTRODUODENOSCOPY (EGD) WITH PROPOFOL N/A 08/11/2019   Procedure: ESOPHAGOGASTRODUODENOSCOPY (EGD) WITH PROPOFOL with Dilation;  Surgeon: [name], MD;  Location: [facility] ENDOSCOPY;  Service: Gastroenterology;  Laterality: N/A;  . ESOPHAGOGASTRODUODENOSCOPY (EGD) WITH PROPOFOL N/A 09/11/2019   Procedure: ESOPHAGOGASTRODUODENOSCOPY (EGD) WITH PROPOFOL with Dilation;  Surgeon: [name], MD;  Location: [facility] ENDOSCOPY;  Service: Gastroenterology;  Laterality: N/A;  . ESOPHAGOGASTRODUODENOSCOPY (EGD) WITH PROPOFOL N/A 10/02/2019   Procedure: ESOPHAGOGASTRODUODENOSCOPY (EGD) WITH PROPOFOL with Dilation;  Surgeon: [name], MD;  Location: [facility] ENDOSCOPY;  Service: Gastroenterology;  Laterality: N/A;  . ESOPHAGOGASTRODUODENOSCOPY (EGD) WITH PROPOFOL N/A 11/04/2019   Procedure: ESOPHAGOGASTRODUODENOSCOPY (EGD) WITH PROPOFOL;  Surgeon: [name], MD;  Location: [facility] ENDOSCOPY;  Service: Gastroenterology;  Laterality: N/A;  . ESOPHAGOGASTRODUODENOSCOPY (EGD) WITH PROPOFOL N/A 02/05/2020   Procedure: ESOPHAGOGASTRODUODENOSCOPY (EGD) WITH PROPOFOL;  Surgeon: [name], MD;  Location: [facility] ENDOSCOPY;  Service: Gastroenterology;  Laterality: N/A;  . EYE SURGERY     retinal detatchment  . FRACTURE SURGERY    . radical prostate    . TONSILLECTOMY      Prior to Admission medications   Medication Sig Start Date End Date Taking? Authorizing Provider  atorvastatin (LIPITOR) 40 MG tablet Take 1 tablet (40 mg total) by mouth daily at 6 PM. 09/18/18  Yes Fritzi Mandes, MD  famciclovir (FAMVIR) 250 MG tablet Take 250 mg by mouth 2 (two) times daily.   Yes [provider]  losartan (COZAAR) 50 MG tablet Take 50 mg by  mouth daily. 08/20/18  Yes [provider]  metoprolol tartrate (LOPRESSOR) 25 MG tablet Take 25 mg by mouth 2 (two) times daily.   Yes [provider]  omeprazole (PRILOSEC) 20 MG capsule Take 20 mg by mouth daily.   Yes [provider]  aspirin EC 325 MG EC tablet Take 1 tablet (325 mg total) by mouth daily. Patient not taking: Reported on 02/19/2020 09/19/18   Fritzi Mandes, MD    Allergies as of 02/16/2020  . (No Known Allergies)    History reviewed. No pertinent family history.  Social History   Socioeconomic History  . Marital status: Married    Spouse name: Not on file  . Number of children: Not on file  . Years of education: Not on file  . Highest education level: Not on file  Occupational History  . Not on file  Tobacco Use  . Smoking status: Former Research scientist (life sciences)  . Smokeless tobacco: Never Used  Vaping Use  . Vaping Use: Never used  Substance and Sexual Activity  . Alcohol use: Not Currently  . Drug use: Never  . Sexual activity: Not on file  Other Topics Concern  . Not on file  Social History Narrative   Lives at home with wife   Social Determinants of Health   Financial Resource Strain: Not on file  Food Insecurity: Not on file  Transportation Needs: Not on file  Physical Activity: Not on file  Stress: Not on file  Social Connections: Not on file  Intimate Partner Violence: Not on file    Review of Systems: See HPI, otherwise negative ROS  Physical Exam: BP (!) 151/88   Pulse 66   Temp (!) 97.2 F (36.2 C) (Temporal)   Resp 18   Ht 6' (1.829 m)   Wt 90.7 kg   SpO2 100%   BMI 27.12 kg/m  General:   Alert,  pleasant and cooperative in NAD Head:  Normocephalic and atraumatic. Neck:  Supple; no masses or thyromegaly. Lungs:  Clear throughout to auscultation, normal respiratory effort.    Heart:  +S1, +S2, Regular rate and rhythm, No edema. Abdomen:  Soft, nontender and  nondistended. Normal bowel sounds, without guarding, and without rebound.   Neurologic:  Alert and  oriented x4;  grossly normal neurologically.  Impression/Plan: Andrew Mullins is here for an endoscopy  to be performed for  evaluation of esophageal stricture    Risks, benefits, limitations, and alternatives regarding endoscopy have been reviewed with the patient.  Questions have been answered.  All parties agreeable.   [name], MD  02/19/2020, 11:03 AM

## 2020-02-19 NOTE — Op Note (Signed)
[facility] Gastroenterology Patient Name: Andrew Mullins Procedure Date: 02/19/2020 11:11 AM MRN: [id number] Account #: [id number] Date of Birth: 05-27-1955 Admit Type: Outpatient Age: 65 Room: [facility] ENDO ROOM 4 Gender: Male Note Status: Finalized Procedure:             Upper GI endoscopy Indications:           Dysphagia Providers:             [name] MD, MD Referring MD:          Baxter Hire, MD (Referring MD) Medicines:             Monitored Anesthesia Care Complications:         No immediate complications. Procedure:             Pre-Anesthesia Assessment:                        - Prior to the procedure, a History and Physical was                         performed, and patient medications, allergies and                         sensitivities were reviewed. The patient's tolerance                         of previous anesthesia was reviewed.                        - The risks and benefits of the procedure and the                         sedation options and risks were discussed with the                         patient. All questions were answered and informed                         consent was obtained.                        - After reviewing the risks and benefits, the patient                         was deemed in satisfactory condition to undergo the                         procedure.                        - ASA Grade Assessment: II - A patient with mild                         systemic disease.                        After obtaining informed consent, the endoscope was                         passed under direct vision. Throughout the procedure,  the patient's blood pressure, pulse, and oxygen                         saturations were monitored continuously. The Endoscope                         was introduced through the mouth, and advanced to the                         third part of duodenum. The upper GI endoscopy was                          accomplished without difficulty. The patient tolerated                         the procedure well. Findings:      One benign-appearing, intrinsic severe stenosis was found in the upper       third of the esophagus. This stenosis measured 9 mm (inner diameter) x 1       cm (in length). The stenosis was traversed after dilation. A TTS dilator       was passed through the scope. Dilation with a 10-26-10 mm balloon       dilator was performed to 12 mm. The dilation site was examined and       showed moderate mucosal disruption.      The exam was otherwise without abnormality.      The stomach was normal.      The examined duodenum was normal. Impression:            - Benign-appearing esophageal stenosis. Dilated.                        - The examination was otherwise normal.                        - Normal stomach.                        - Normal examined duodenum.                        - No specimens collected. Recommendation:        - Discharge patient to home (with escort).                        - Resume previous diet.                        - Continue present medications.                        - Repeat upper endoscopy in 2 weeks for retreatment. Procedure Code(s):     --- Professional ---                        [phone], Esophagogastroduodenoscopy, flexible,                         transoral; with transendoscopic balloon dilation of  esophagus (less than 30 mm diameter) Diagnosis Code(s):     --- Professional ---                        K22.2, Esophageal obstruction                        R13.10, Dysphagia, unspecified CPT copyright 2019 American Medical Association. All rights reserved. The codes documented in this report are preliminary and upon coder review may  be revised to meet current compliance requirements. [name], MD [name] MD, MD 02/19/2020 11:30:43 AM This report has been signed electronically. Number of Addenda: 0 Note  Initiated On: 02/19/2020 11:11 AM Estimated Blood Loss:  Estimated blood loss: none.      [facility]

## 2020-02-19 NOTE — Transfer of Care (Signed)
Immediate Anesthesia Transfer of Care Note  Patient: Andrew Mullins  Procedure(s) Performed: ESOPHAGOGASTRODUODENOSCOPY (EGD) WITH PROPOFOL (N/A )  Patient Location: PACU  Anesthesia Type:General  Level of Consciousness: drowsy  Airway & Oxygen Therapy: Patient Spontanous Breathing  Post-op Assessment: Report given to RN and Post -op Vital signs reviewed and stable  Post vital signs: Reviewed and stable  Last Vitals:  Vitals Value Taken Time  BP 124/83 02/19/20 1136  Temp    Pulse 67 02/19/20 1136  Resp 20 02/19/20 1136  SpO2 96 % 02/19/20 1136  Vitals shown include unvalidated device data.  Last Pain:  Vitals:   02/19/20 1135  TempSrc:   PainSc: 0-No pain         Complications: No complications documented.

## 2020-02-25 ENCOUNTER — Other Ambulatory Visit: Payer: Self-pay

## 2020-02-25 DIAGNOSIS — K222 Esophageal obstruction: Secondary | ICD-10-CM

## 2020-02-25 NOTE — Progress Notes (Signed)
Request on behalf of Dr. Vicente Males to send patient for a 2nd opinion to Dr. Lysle Rubens.

## 2020-03-02 ENCOUNTER — Other Ambulatory Visit: Payer: Self-pay

## 2020-03-02 DIAGNOSIS — K222 Esophageal obstruction: Secondary | ICD-10-CM

## 2020-03-02 NOTE — Progress Notes (Signed)
Pt has been notified of scheduled procedure and instructions will be mailed out.

## 2020-03-08 ENCOUNTER — Other Ambulatory Visit
Admission: RE | Admit: 2020-03-08 | Discharge: 2020-03-08 | Disposition: A | Discharge status: Home / Self Care | Payer: BC Managed Care – PPO | Source: Ambulatory Visit | Attending: Gastroenterology | Admitting: Gastroenterology

## 2020-03-09 ENCOUNTER — Encounter: Payer: Self-pay | Admitting: Gastroenterology

## 2020-03-09 ENCOUNTER — Other Ambulatory Visit: Payer: Self-pay

## 2020-03-09 ENCOUNTER — Other Ambulatory Visit
Admission: RE | Admit: 2020-03-09 | Discharge: 2020-03-09 | Disposition: A | Discharge status: Home / Self Care | Payer: BC Managed Care – PPO | Source: Ambulatory Visit | Attending: Gastroenterology | Admitting: Gastroenterology

## 2020-03-09 DIAGNOSIS — Z7982 Long term (current) use of aspirin: Secondary | ICD-10-CM | POA: Diagnosis not present

## 2020-03-09 DIAGNOSIS — Z01812 Encounter for preprocedural laboratory examination: Secondary | ICD-10-CM | POA: Insufficient documentation

## 2020-03-09 DIAGNOSIS — K219 Gastro-esophageal reflux disease without esophagitis: Secondary | ICD-10-CM | POA: Diagnosis not present

## 2020-03-09 DIAGNOSIS — Z8546 Personal history of malignant neoplasm of prostate: Secondary | ICD-10-CM | POA: Diagnosis not present

## 2020-03-09 DIAGNOSIS — Z20822 Contact with and (suspected) exposure to covid-19: Secondary | ICD-10-CM | POA: Insufficient documentation

## 2020-03-09 DIAGNOSIS — K222 Esophageal obstruction: Secondary | ICD-10-CM | POA: Diagnosis not present

## 2020-03-09 DIAGNOSIS — Z8501 Personal history of malignant neoplasm of esophagus: Secondary | ICD-10-CM | POA: Diagnosis not present

## 2020-03-09 DIAGNOSIS — Z8673 Personal history of transient ischemic attack (TIA), and cerebral infarction without residual deficits: Secondary | ICD-10-CM | POA: Diagnosis not present

## 2020-03-09 DIAGNOSIS — Z87891 Personal history of nicotine dependence: Secondary | ICD-10-CM | POA: Diagnosis not present

## 2020-03-09 DIAGNOSIS — Z79899 Other long term (current) drug therapy: Secondary | ICD-10-CM | POA: Diagnosis not present

## 2020-03-09 LAB — SARS CORONAVIRUS 2 (TAT 6-24 HRS): SARS Coronavirus 2: NEGATIVE

## 2020-03-10 ENCOUNTER — Ambulatory Visit
Admission: RE | Admit: 2020-03-10 | Discharge: 2020-03-10 | Disposition: A | Discharge status: Home / Self Care | Payer: BC Managed Care – PPO | Attending: Gastroenterology | Admitting: Gastroenterology

## 2020-03-10 ENCOUNTER — Ambulatory Visit: Payer: BC Managed Care – PPO | Admitting: Anesthesiology

## 2020-03-10 ENCOUNTER — Encounter: Payer: Self-pay | Admitting: Gastroenterology

## 2020-03-10 ENCOUNTER — Encounter
Admission: RE | Disposition: A | Discharge status: Home / Self Care | Payer: Self-pay | Source: Home / Self Care | Attending: Gastroenterology

## 2020-03-10 DIAGNOSIS — Z8501 Personal history of malignant neoplasm of esophagus: Secondary | ICD-10-CM | POA: Insufficient documentation

## 2020-03-10 DIAGNOSIS — K222 Esophageal obstruction: Secondary | ICD-10-CM | POA: Diagnosis not present

## 2020-03-10 DIAGNOSIS — Z8546 Personal history of malignant neoplasm of prostate: Secondary | ICD-10-CM | POA: Insufficient documentation

## 2020-03-10 DIAGNOSIS — K219 Gastro-esophageal reflux disease without esophagitis: Secondary | ICD-10-CM | POA: Insufficient documentation

## 2020-03-10 DIAGNOSIS — Z20822 Contact with and (suspected) exposure to covid-19: Secondary | ICD-10-CM | POA: Insufficient documentation

## 2020-03-10 DIAGNOSIS — Z8673 Personal history of transient ischemic attack (TIA), and cerebral infarction without residual deficits: Secondary | ICD-10-CM | POA: Insufficient documentation

## 2020-03-10 DIAGNOSIS — Z7982 Long term (current) use of aspirin: Secondary | ICD-10-CM | POA: Insufficient documentation

## 2020-03-10 DIAGNOSIS — Z87891 Personal history of nicotine dependence: Secondary | ICD-10-CM | POA: Insufficient documentation

## 2020-03-10 DIAGNOSIS — Z79899 Other long term (current) drug therapy: Secondary | ICD-10-CM | POA: Insufficient documentation

## 2020-03-10 HISTORY — PX: ESOPHAGOGASTRODUODENOSCOPY (EGD) WITH PROPOFOL: SHX5813

## 2020-03-10 SURGERY — ESOPHAGOGASTRODUODENOSCOPY (EGD) WITH PROPOFOL
Anesthesia: General

## 2020-03-10 MED ORDER — PROPOFOL 10 MG/ML IV BOLUS
INTRAVENOUS | Status: AC
  Filled 2020-03-10: qty 20

## 2020-03-10 MED ORDER — SODIUM CHLORIDE 0.9 % IV SOLN: INTRAVENOUS | Status: DC

## 2020-03-10 MED ORDER — PROPOFOL 10 MG/ML IV BOLUS
INTRAVENOUS | Status: DC | PRN
  Administered 2020-03-10 (×2): 20 mg via INTRAVENOUS
  Administered 2020-03-10: 80 mg via INTRAVENOUS
  Administered 2020-03-10: 30 mg via INTRAVENOUS

## 2020-03-10 MED ORDER — LIDOCAINE HCL (CARDIAC) PF 100 MG/5ML IV SOSY
PREFILLED_SYRINGE | INTRAVENOUS | Status: DC | PRN
  Administered 2020-03-10: 50 mg via INTRAVENOUS

## 2020-03-10 NOTE — Op Note (Signed)
[facility] Gastroenterology Patient Name: Andrew Mullins Procedure Date: 03/10/2020 9:24 AM MRN: [id number] Account #: [id number] Date of Birth: Aug 16, 1955 Admit Type: Outpatient Age: 65 Room: [facility] ENDO ROOM 2 Gender: Male Note Status: Finalized Procedure:             Upper GI endoscopy Indications:           Follow-up of esophageal stenosis Providers:             [name] MD, MD Referring MD:          Biagio Borg, MD (Referring MD) Medicines:             Monitored Anesthesia Care Complications:         No immediate complications. Procedure:             Pre-Anesthesia Assessment:                        - Prior to the procedure, a History and Physical was                         performed, and patient medications, allergies and                         sensitivities were reviewed. The patient's tolerance                         of previous anesthesia was reviewed.                        - The risks and benefits of the procedure and the                         sedation options and risks were discussed with the                         patient. All questions were answered and informed                         consent was obtained.                        - ASA Grade Assessment: II - A patient with mild                         systemic disease.                        After obtaining informed consent, the endoscope was                         passed under direct vision. Throughout the procedure,                         the patient's blood pressure, pulse, and oxygen                         saturations were monitored continuously. The Endoscope                         was introduced through the  mouth, and advanced to the                         third part of duodenum. The upper GI endoscopy was                         accomplished with ease. The patient tolerated the                         procedure well. Findings:      One benign-appearing, intrinsic severe  stenosis was found in the upper       third of the esophagus. This stenosis measured 9 mm (inner diameter) x 1       cm (in length). The stenosis was traversed after dilation. A TTS dilator       was passed through the scope. Dilation with a 10-26-10 mm balloon       dilator was performed to 12 mm. The dilation site was examined and       showed moderate mucosal disruption.      The exam was otherwise without abnormality.      The stomach was normal.      The examined duodenum was normal. Impression:            - Benign-appearing esophageal stenosis. Dilated.                        - The examination was otherwise normal.                        - Normal stomach.                        - Normal examined duodenum.                        - No specimens collected. Recommendation:        - Discharge patient to home (with escort).                        - Resume previous diet.                        - Continue present medications.                        - Repeat upper endoscopy in 2 weeks to evaluate the                         response to therapy. Procedure Code(s):     --- Professional ---                        [phone], Esophagogastroduodenoscopy, flexible,                         transoral; with transendoscopic balloon dilation of                         esophagus (less than 30 mm diameter) Diagnosis Code(s):     --- Professional ---  K22.2, Esophageal obstruction CPT copyright 2019 American Medical Association. All rights reserved. The codes documented in this report are preliminary and upon coder review may  be revised to meet current compliance requirements. [name], MD [name] MD, MD 03/10/2020 9:38:04 AM This report has been signed electronically. Number of Addenda: 0 Note Initiated On: 03/10/2020 9:24 AM Estimated Blood Loss:  Estimated blood loss: none.      [facility]

## 2020-03-10 NOTE — Anesthesia Procedure Notes (Signed)
Procedure Name: MAC Date/Time: 03/10/2020 9:25 AM Performed by: Jerrye Noble, CRNA Pre-anesthesia Checklist: Patient identified, Emergency Drugs available, Suction available and Patient being monitored Patient Re-evaluated:Patient Re-evaluated prior to induction Oxygen Delivery Method: Nasal cannula

## 2020-03-10 NOTE — Transfer of Care (Signed)
Immediate Anesthesia Transfer of Care Note  Patient: Andrew Mullins  Procedure(s) Performed: ESOPHAGOGASTRODUODENOSCOPY (EGD) WITH PROPOFOL (N/A )  Patient Location: PACU and Endoscopy Unit  Anesthesia Type:General  Level of Consciousness: awake, alert  and oriented  Airway & Oxygen Therapy: Patient Spontanous Breathing  Post-op Assessment: Report given to RN and Post -op Vital signs reviewed and stable  Post vital signs: Reviewed and stable  Last Vitals:  Vitals Value Taken Time  BP 155/96 03/10/20 0940  Temp 36.3 C 03/10/20 0940  Pulse 71 03/10/20 0941  Resp 23 03/10/20 0941  SpO2 97 % 03/10/20 0941  Vitals shown include unvalidated device data.  Last Pain:  Vitals:   03/10/20 0940  TempSrc: Temporal  PainSc: 0-No pain         Complications: No complications documented.

## 2020-03-10 NOTE — H&P (Signed)
[name], MD [address] [place], [place], [place], [address] [address] [place], [place], [place], [address] Phone: [phone]  Fax: [phone]  Primary Care Physician:  Baxter Hire, MD   Pre-Procedure History & Physical: HPI:  Andrew Mullins is a 65 y.o. male is here for an endoscopy    Past Medical History:  Diagnosis Date  . 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 GERD (gastroesophageal reflux disease)   . Hypertension   . Prostate cancer ([place])   . Stroke ([place]) 2020   no wealness    Past Surgical History:  Procedure Laterality Date  . broken bones repair    . ESOPHAGOGASTRODUODENOSCOPY (EGD) WITH PROPOFOL N/A 02/18/2018   Procedure: ESOPHAGOGASTRODUODENOSCOPY (EGD) WITH PROPOFOL;  Surgeon: [name], MD;  Location: [facility] ENDOSCOPY;  Service: Gastroenterology;  Laterality: N/A;  . ESOPHAGOGASTRODUODENOSCOPY (EGD) WITH PROPOFOL N/A 04/01/2018   Procedure: ESOPHAGOGASTRODUODENOSCOPY (EGD) WITH PROPOFOL;  Surgeon: [name], MD;  Location: [facility] ENDOSCOPY;  Service: Gastroenterology;  Laterality: N/A;  . ESOPHAGOGASTRODUODENOSCOPY (EGD) WITH PROPOFOL N/A 12/02/2018   Procedure: ESOPHAGOGASTRODUODENOSCOPY (EGD) WITH PROPOFOL with Dilation;  Surgeon: [name], MD;  Location: [facility] ENDOSCOPY;  Service: Gastroenterology;  Laterality: N/A;  . ESOPHAGOGASTRODUODENOSCOPY (EGD) WITH PROPOFOL N/A 05/19/2019   Procedure: ESOPHAGOGASTRODUODENOSCOPY (EGD) WITH PROPOFOL;  Surgeon: [name], MD;  Location: [facility] ENDOSCOPY;  Service: Gastroenterology;  Laterality: N/A;  . ESOPHAGOGASTRODUODENOSCOPY (EGD) WITH PROPOFOL N/A 05/18/2019   Procedure: ESOPHAGOGASTRODUODENOSCOPY (EGD) WITH PROPOFOL with Dilation;  Surgeon: [name], MD;  Location: [facility] ENDOSCOPY;  Service: Gastroenterology;  Laterality: N/A;  . ESOPHAGOGASTRODUODENOSCOPY (EGD) WITH PROPOFOL N/A 06/25/2019   Procedure: ESOPHAGOGASTRODUODENOSCOPY (EGD) WITH PROPOFOL  with Dilation;  Surgeon: [name], MD;   Location: [facility] ENDOSCOPY;  Service: Gastroenterology;  Laterality: N/A;  . ESOPHAGOGASTRODUODENOSCOPY (EGD) WITH PROPOFOL N/A 07/09/2019   Procedure: ESOPHAGOGASTRODUODENOSCOPY (EGD) WITH PROPOFOL with Dilation;  Surgeon: [name], MD;  Location: [facility] ENDOSCOPY;  Service: Gastroenterology;  Laterality: N/A;  Pt requests early morning  . ESOPHAGOGASTRODUODENOSCOPY (EGD) WITH PROPOFOL N/A 08/11/2019   Procedure: ESOPHAGOGASTRODUODENOSCOPY (EGD) WITH PROPOFOL with Dilation;  Surgeon: [name], MD;  Location: [facility] ENDOSCOPY;  Service: Gastroenterology;  Laterality: N/A;  . ESOPHAGOGASTRODUODENOSCOPY (EGD) WITH PROPOFOL N/A 09/11/2019   Procedure: ESOPHAGOGASTRODUODENOSCOPY (EGD) WITH PROPOFOL with Dilation;  Surgeon: [name], MD;  Location: [facility] ENDOSCOPY;  Service: Gastroenterology;  Laterality: N/A;  . ESOPHAGOGASTRODUODENOSCOPY (EGD) WITH PROPOFOL N/A 10/02/2019   Procedure: ESOPHAGOGASTRODUODENOSCOPY (EGD) WITH PROPOFOL with Dilation;  Surgeon: [name], MD;  Location: [facility] ENDOSCOPY;  Service: Gastroenterology;  Laterality: N/A;  . ESOPHAGOGASTRODUODENOSCOPY (EGD) WITH PROPOFOL N/A 11/04/2019   Procedure: ESOPHAGOGASTRODUODENOSCOPY (EGD) WITH PROPOFOL;  Surgeon: [name], MD;  Location: [facility] ENDOSCOPY;  Service: Gastroenterology;  Laterality: N/A;  . ESOPHAGOGASTRODUODENOSCOPY (EGD) WITH PROPOFOL N/A 02/05/2020   Procedure: ESOPHAGOGASTRODUODENOSCOPY (EGD) WITH PROPOFOL;  Surgeon: [name], MD;  Location: [facility] ENDOSCOPY;  Service: Gastroenterology;  Laterality: N/A;  . ESOPHAGOGASTRODUODENOSCOPY (EGD) WITH PROPOFOL N/A 02/19/2020   Procedure: ESOPHAGOGASTRODUODENOSCOPY (EGD) WITH PROPOFOL;  Surgeon: [name], MD;  Location: [facility] ENDOSCOPY;  Service: Gastroenterology;  Laterality: N/A;  . EYE SURGERY     retinal detatchment  . FRACTURE SURGERY    . radical prostate    . TONSILLECTOMY      Prior to Admission medications   Medication Sig Start Date End Date Taking? Authorizing  Provider  atorvastatin (LIPITOR) 40 MG tablet Take 1 tablet (40 mg total) by mouth daily at 6 PM. 09/18/18  Yes Fritzi Mandes, MD  famciclovir (FAMVIR) 250 MG tablet Take 250 mg by mouth 2 (two) times daily.   Yes [provider]  losartan (COZAAR) 50 MG tablet Take 50 mg by mouth daily. 08/20/18  Yes [provider]  metoprolol tartrate (LOPRESSOR) 25 MG tablet Take 25 mg by mouth 2 (two) times daily.   Yes [provider]  omeprazole (PRILOSEC) 20 MG capsule Take 20 mg by mouth daily.   Yes [provider]  aspirin EC 325 MG EC tablet Take 1 tablet (325 mg total) by mouth daily. Patient not taking: No sig reported 09/19/18   Fritzi Mandes, MD    Allergies as of 03/03/2020  . (No Known Allergies)    History reviewed. No pertinent family history.  Social History   Socioeconomic History  . Marital status: Married    Spouse name: Not on file  . Number of children: Not on file  . Years of education: Not on file  . Highest education level: Not on file  Occupational History  . Not on file  Tobacco Use  . Smoking status: Former Research scientist (life sciences)  . Smokeless tobacco: Never Used  Vaping Use  . Vaping Use: Never used  Substance and Sexual Activity  . Alcohol use: Not Currently  . Drug use: Never  . Sexual activity: Not on file  Other Topics Concern  . Not on file  Social History Narrative   Lives at home with wife   Social Determinants of Health   Financial Resource Strain: Not on file  Food Insecurity: Not on file  Transportation Needs: Not on file  Physical Activity: Not on file  Stress: Not on file  Social Connections: Not on file  Intimate Partner Violence: Not on file    Review of Systems: See HPI, otherwise negative ROS  Physical Exam: BP (!) 159/88   Pulse 69   Temp (!) 97.1 F (36.2 C) (Temporal)   Resp 18   Ht 6' (1.829 m)   Wt 90.7 kg   SpO2 99%   BMI 27.12 kg/m  General:   Alert,  pleasant and cooperative in NAD Head:  Normocephalic  and atraumatic. Neck:  Supple; no masses or thyromegaly. Lungs:  Clear throughout to auscultation, normal respiratory effort.    Heart:  +S1, +S2, Regular rate and rhythm, No edema. Abdomen:  Soft, nontender and nondistended. Normal bowel sounds, without guarding, and without rebound.   Neurologic:  Alert and  oriented x4;  grossly normal neurologically.  Impression/Plan: Andrew Mullins is here for an endoscopy  to be performed for  evaluation of esophageal stricture    Risks, benefits, limitations, and alternatives regarding endoscopy have been reviewed with the patient.  Questions have been answered.  All parties agreeable.   [name], MD  03/10/2020, 8:38 AM

## 2020-03-10 NOTE — Anesthesia Preprocedure Evaluation (Signed)
Anesthesia Evaluation  Patient identified by MRN, date of birth, ID band Patient awake    Reviewed: Allergy & Precautions, H&P , NPO status , reviewed documented beta blocker date and time   Airway Mallampati: II  TM Distance: >3 FB Neck ROM: full    Dental  (+) Teeth Intact   Pulmonary former smoker,    Pulmonary exam normal        Cardiovascular hypertension, Normal cardiovascular exam  09/2018 ECHO IMPRESSIONS    1. The left ventricle has normal systolic function with an ejection  fraction of 60-65%. The cavity size was normal. There is mildly increased  left ventricular wall thickness. Left ventricular diastolic Doppler  parameters are consistent with impaired  relaxation.  2. The right ventricle has normal systolic function. The cavity was  normal. There is no increase in right ventricular wall thickness. Right  ventricular systolic pressure is normal with an estimated pressure of 24.4  mmHg.    Neuro/Psych CVA    GI/Hepatic GERD  ,  Endo/Other    Renal/GU      Musculoskeletal   Abdominal   Peds  Hematology   Anesthesia Other Findings Past Medical History: No date: Cancer [place] [facility])     Comment:  esophageal No date: GERD (gastroesophageal reflux disease) No date: Hypertension No date: Prostate cancer ([place]) 2020: Stroke ([place])     Comment:  no residual weakness  Past Surgical History: No date: broken bones repair 02/18/2018: ESOPHAGOGASTRODUODENOSCOPY (EGD) WITH PROPOFOL; N/A     Comment:  Procedure: ESOPHAGOGASTRODUODENOSCOPY (EGD) WITH               PROPOFOL;  Surgeon: [name], MD;  Location: [place];  Service: Gastroenterology;  Laterality: N/A; 04/01/2018: ESOPHAGOGASTRODUODENOSCOPY (EGD) WITH PROPOFOL; N/A     Comment:  Procedure: ESOPHAGOGASTRODUODENOSCOPY (EGD) WITH               PROPOFOL;  Surgeon: [name], MD;  Location: [facility]               ENDOSCOPY;  Service:  Gastroenterology;  Laterality: N/A; 12/02/2018: ESOPHAGOGASTRODUODENOSCOPY (EGD) WITH PROPOFOL; N/A     Comment:  Procedure: ESOPHAGOGASTRODUODENOSCOPY (EGD) WITH               PROPOFOL with Dilation;  Surgeon: [name], MD;                Location: [facility] ENDOSCOPY;  Service: Gastroenterology;                Laterality: N/A; 05/19/2019: ESOPHAGOGASTRODUODENOSCOPY (EGD) WITH PROPOFOL; N/A     Comment:  Procedure: ESOPHAGOGASTRODUODENOSCOPY (EGD) WITH               PROPOFOL;  Surgeon: [name], MD;  Location: [facility]               ENDOSCOPY;  Service: Gastroenterology;  Laterality: N/A; 05/18/2019: ESOPHAGOGASTRODUODENOSCOPY (EGD) WITH PROPOFOL; N/A     Comment:  Procedure: ESOPHAGOGASTRODUODENOSCOPY (EGD) WITH               PROPOFOL with Dilation;  Surgeon: [name], MD;                Location: [facility] ENDOSCOPY;  Service: Gastroenterology;                Laterality: N/A; 06/25/2019: ESOPHAGOGASTRODUODENOSCOPY (EGD) WITH PROPOFOL; N/A     Comment:  Procedure: ESOPHAGOGASTRODUODENOSCOPY (EGD) WITH  PROPOFOL  with Dilation;  Surgeon: [name], MD;                Location: [facility] ENDOSCOPY;  Service: Gastroenterology;                Laterality: N/A; 07/09/2019: ESOPHAGOGASTRODUODENOSCOPY (EGD) WITH PROPOFOL; N/A     Comment:  Procedure: ESOPHAGOGASTRODUODENOSCOPY (EGD) WITH               PROPOFOL with Dilation;  Surgeon: [name], MD;                Location: [facility] ENDOSCOPY;  Service: Gastroenterology;                Laterality: N/A;  Pt requests early morning 08/11/2019: ESOPHAGOGASTRODUODENOSCOPY (EGD) WITH PROPOFOL; N/A     Comment:  Procedure: ESOPHAGOGASTRODUODENOSCOPY (EGD) WITH               PROPOFOL with Dilation;  Surgeon: [name], MD;                Location: [facility] ENDOSCOPY;  Service: Gastroenterology;                Laterality: N/A; 09/11/2019: ESOPHAGOGASTRODUODENOSCOPY (EGD) WITH PROPOFOL; N/A     Comment:  Procedure: ESOPHAGOGASTRODUODENOSCOPY (EGD) WITH                PROPOFOL with Dilation;  Surgeon: [name], MD;                Location: [facility] ENDOSCOPY;  Service: Gastroenterology;                Laterality: N/A; 10/02/2019: ESOPHAGOGASTRODUODENOSCOPY (EGD) WITH PROPOFOL; N/A     Comment:  Procedure: ESOPHAGOGASTRODUODENOSCOPY (EGD) WITH               PROPOFOL with Dilation;  Surgeon: [name], MD;                Location: [facility] ENDOSCOPY;  Service: Gastroenterology;                Laterality: N/A; 11/04/2019: ESOPHAGOGASTRODUODENOSCOPY (EGD) WITH PROPOFOL; N/A     Comment:  Procedure: ESOPHAGOGASTRODUODENOSCOPY (EGD) WITH               PROPOFOL;  Surgeon: [name], MD;  Location: [facility]               ENDOSCOPY;  Service: Gastroenterology;  Laterality: N/A; 02/05/2020: ESOPHAGOGASTRODUODENOSCOPY (EGD) WITH PROPOFOL; N/A     Comment:  Procedure: ESOPHAGOGASTRODUODENOSCOPY (EGD) WITH               PROPOFOL;  Surgeon: [name], MD;  Location: [facility]               ENDOSCOPY;  Service: Gastroenterology;  Laterality: N/A; 02/19/2020: ESOPHAGOGASTRODUODENOSCOPY (EGD) WITH PROPOFOL; N/A     Comment:  Procedure: ESOPHAGOGASTRODUODENOSCOPY (EGD) WITH               PROPOFOL;  Surgeon: [name], MD;  Location: [facility]               ENDOSCOPY;  Service: Gastroenterology;  Laterality: N/A; No date: EYE SURGERY     Comment:  retinal detatchment No date: FRACTURE SURGERY No date: radical prostate No date: TONSILLECTOMY  BMI    Body Mass Index: 27.12 kg/m      Reproductive/Obstetrics  Anesthesia Physical Anesthesia Plan  ASA: III  Anesthesia Plan: General   Post-op Pain Management:    Induction: Intravenous  PONV Risk Score and Plan: Treatment may vary due to age or medical condition and TIVA  Airway Management Planned: Nasal Cannula and Natural Airway  Additional Equipment:   Intra-op Plan:   Post-operative Plan:   Informed Consent: I have reviewed the patients History and  Physical, chart, labs and discussed the procedure including the risks, benefits and alternatives for the proposed anesthesia with the patient or authorized representative who has indicated his/her understanding and acceptance.     Dental Advisory Given  Plan Discussed with: CRNA  Anesthesia Plan Comments:         Anesthesia Quick Evaluation

## 2020-03-10 NOTE — Anesthesia Postprocedure Evaluation (Signed)
Anesthesia Post Note  Patient: Andrew Mullins  Procedure(s) Performed: ESOPHAGOGASTRODUODENOSCOPY (EGD) WITH PROPOFOL (N/A )  Patient location during evaluation: Endoscopy Anesthesia Type: General Level of consciousness: awake and alert Pain management: pain level controlled Vital Signs Assessment: post-procedure vital signs reviewed and stable Respiratory status: spontaneous breathing, nonlabored ventilation and respiratory function stable Cardiovascular status: blood pressure returned to baseline and stable Postop Assessment: no apparent nausea or vomiting Anesthetic complications: no   No complications documented.   Last Vitals:  Vitals:   03/10/20 1000 03/10/20 1010  BP: (!) 150/85 (!) 168/86  Pulse: (!) 56 (!) 58  Resp: 16 14  Temp:    SpO2: 98% 100%    Last Pain:  Vitals:   03/10/20 1010  TempSrc:   PainSc: 0-No pain                 Alphonsus Sias

## 2020-03-11 ENCOUNTER — Encounter: Payer: Self-pay | Admitting: Gastroenterology

## 2020-03-23 ENCOUNTER — Other Ambulatory Visit: Payer: Self-pay

## 2020-03-23 ENCOUNTER — Telehealth: Payer: Self-pay

## 2020-03-23 DIAGNOSIS — K222 Esophageal obstruction: Secondary | ICD-10-CM

## 2020-03-23 NOTE — Telephone Encounter (Signed)
Called pt to schedule repeat upper endoscopy. Unable to contact, LVM to return call.

## 2020-03-29 ENCOUNTER — Other Ambulatory Visit: Admission: RE | Admit: 2020-03-29 | Payer: BC Managed Care – PPO | Source: Ambulatory Visit

## 2020-03-30 ENCOUNTER — Other Ambulatory Visit
Admission: RE | Admit: 2020-03-30 | Discharge: 2020-03-30 | Disposition: A | Discharge status: Home / Self Care | Payer: BC Managed Care – PPO | Source: Ambulatory Visit | Attending: Gastroenterology | Admitting: Gastroenterology

## 2020-03-30 ENCOUNTER — Other Ambulatory Visit: Payer: Self-pay

## 2020-03-30 ENCOUNTER — Encounter: Payer: Self-pay | Admitting: Gastroenterology

## 2020-03-30 DIAGNOSIS — Z8546 Personal history of malignant neoplasm of prostate: Secondary | ICD-10-CM | POA: Diagnosis not present

## 2020-03-30 DIAGNOSIS — Z01812 Encounter for preprocedural laboratory examination: Secondary | ICD-10-CM | POA: Insufficient documentation

## 2020-03-30 DIAGNOSIS — K222 Esophageal obstruction: Secondary | ICD-10-CM | POA: Diagnosis not present

## 2020-03-30 DIAGNOSIS — Z8501 Personal history of malignant neoplasm of esophagus: Secondary | ICD-10-CM | POA: Diagnosis not present

## 2020-03-30 DIAGNOSIS — Z7982 Long term (current) use of aspirin: Secondary | ICD-10-CM | POA: Diagnosis not present

## 2020-03-30 DIAGNOSIS — R1310 Dysphagia, unspecified: Secondary | ICD-10-CM | POA: Diagnosis not present

## 2020-03-30 DIAGNOSIS — Z20822 Contact with and (suspected) exposure to covid-19: Secondary | ICD-10-CM | POA: Insufficient documentation

## 2020-03-30 DIAGNOSIS — Z87891 Personal history of nicotine dependence: Secondary | ICD-10-CM | POA: Diagnosis not present

## 2020-03-30 DIAGNOSIS — Z8673 Personal history of transient ischemic attack (TIA), and cerebral infarction without residual deficits: Secondary | ICD-10-CM | POA: Diagnosis not present

## 2020-03-30 DIAGNOSIS — Z79899 Other long term (current) drug therapy: Secondary | ICD-10-CM | POA: Diagnosis not present

## 2020-03-30 LAB — SARS CORONAVIRUS 2 (TAT 6-24 HRS): SARS Coronavirus 2: NEGATIVE

## 2020-03-31 ENCOUNTER — Ambulatory Visit: Payer: BC Managed Care – PPO | Admitting: Registered Nurse

## 2020-03-31 ENCOUNTER — Encounter
Admission: RE | Disposition: A | Discharge status: Home / Self Care | Payer: Self-pay | Source: Home / Self Care | Attending: Gastroenterology

## 2020-03-31 ENCOUNTER — Ambulatory Visit
Admission: RE | Admit: 2020-03-31 | Discharge: 2020-03-31 | Disposition: A | Discharge status: Home / Self Care | Payer: BC Managed Care – PPO | Attending: Gastroenterology | Admitting: Gastroenterology

## 2020-03-31 ENCOUNTER — Other Ambulatory Visit: Payer: Self-pay

## 2020-03-31 ENCOUNTER — Encounter: Payer: Self-pay | Admitting: Gastroenterology

## 2020-03-31 DIAGNOSIS — Z7982 Long term (current) use of aspirin: Secondary | ICD-10-CM | POA: Insufficient documentation

## 2020-03-31 DIAGNOSIS — K222 Esophageal obstruction: Secondary | ICD-10-CM | POA: Insufficient documentation

## 2020-03-31 DIAGNOSIS — Z20822 Contact with and (suspected) exposure to covid-19: Secondary | ICD-10-CM | POA: Insufficient documentation

## 2020-03-31 DIAGNOSIS — Z8546 Personal history of malignant neoplasm of prostate: Secondary | ICD-10-CM | POA: Insufficient documentation

## 2020-03-31 DIAGNOSIS — R1310 Dysphagia, unspecified: Secondary | ICD-10-CM | POA: Diagnosis not present

## 2020-03-31 DIAGNOSIS — Z79899 Other long term (current) drug therapy: Secondary | ICD-10-CM | POA: Insufficient documentation

## 2020-03-31 DIAGNOSIS — Z87891 Personal history of nicotine dependence: Secondary | ICD-10-CM | POA: Insufficient documentation

## 2020-03-31 DIAGNOSIS — Z8501 Personal history of malignant neoplasm of esophagus: Secondary | ICD-10-CM | POA: Insufficient documentation

## 2020-03-31 DIAGNOSIS — Z8673 Personal history of transient ischemic attack (TIA), and cerebral infarction without residual deficits: Secondary | ICD-10-CM | POA: Insufficient documentation

## 2020-03-31 HISTORY — PX: ESOPHAGOGASTRODUODENOSCOPY (EGD) WITH PROPOFOL: SHX5813

## 2020-03-31 SURGERY — ESOPHAGOGASTRODUODENOSCOPY (EGD) WITH PROPOFOL
Anesthesia: General

## 2020-03-31 MED ORDER — GLYCOPYRROLATE 0.2 MG/ML IJ SOLN
INTRAMUSCULAR | Status: DC | PRN
  Administered 2020-03-31: .2 mg via INTRAVENOUS

## 2020-03-31 MED ORDER — SODIUM CHLORIDE 0.9 % IV SOLN: INTRAVENOUS | Status: DC

## 2020-03-31 MED ORDER — PROPOFOL 10 MG/ML IV BOLUS
INTRAVENOUS | Status: DC | PRN
  Administered 2020-03-31: 30 mg via INTRAVENOUS
  Administered 2020-03-31: 80 mg via INTRAVENOUS
  Administered 2020-03-31: 50 mg via INTRAVENOUS
  Administered 2020-03-31 (×2): 20 mg via INTRAVENOUS

## 2020-03-31 MED ORDER — LIDOCAINE HCL (CARDIAC) PF 100 MG/5ML IV SOSY
PREFILLED_SYRINGE | INTRAVENOUS | Status: DC | PRN
  Administered 2020-03-31: 100 mg via INTRAVENOUS

## 2020-03-31 NOTE — Transfer of Care (Signed)
Immediate Anesthesia Transfer of Care Note  Patient: Andrew Mullins  Procedure(s) Performed: ESOPHAGOGASTRODUODENOSCOPY (EGD) WITH PROPOFOL (N/A )  Patient Location: PACU  Anesthesia Type:General  Level of Consciousness: sedated  Airway & Oxygen Therapy: Patient Spontanous Breathing and Patient connected to nasal cannula oxygen  Post-op Assessment: Report given to RN and Post -op Vital signs reviewed and stable  Post vital signs: Reviewed and stable  Last Vitals:  Vitals Value Taken Time  BP 119/78 03/31/20 0757  Temp 36.1 C 03/31/20 0757  Pulse 77 03/31/20 0800  Resp 18 03/31/20 0800  SpO2 95 % 03/31/20 0800  Vitals shown include unvalidated device data.  Last Pain:  Vitals:   03/31/20 0757  TempSrc: Temporal  PainSc: Asleep         Complications: No complications documented.

## 2020-03-31 NOTE — H&P (Signed)
[name], MD [address], [place], [place], [place], [address] [id number] [place], [place], [place], [address] Phone: [phone]  Fax: [phone]  Primary Care Physician:  Baxter Hire, MD   Pre-Procedure History & Physical: HPI:  Andrew Mullins is a 65 y.o. male is here for an endoscopy    Past Medical History:  Diagnosis Date  . 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 GERD (gastroesophageal reflux disease)   . Hypertension   . Prostate cancer ([place])   . Stroke ([place]) 2020   no wealness    Past Surgical History:  Procedure Laterality Date  . broken bones repair    . ESOPHAGOGASTRODUODENOSCOPY (EGD) WITH PROPOFOL N/A 02/18/2018   Procedure: ESOPHAGOGASTRODUODENOSCOPY (EGD) WITH PROPOFOL;  Surgeon: [name], MD;  Location: [facility] ENDOSCOPY;  Service: Gastroenterology;  Laterality: N/A;  . ESOPHAGOGASTRODUODENOSCOPY (EGD) WITH PROPOFOL N/A 04/01/2018   Procedure: ESOPHAGOGASTRODUODENOSCOPY (EGD) WITH PROPOFOL;  Surgeon: [name], MD;  Location: [facility] ENDOSCOPY;  Service: Gastroenterology;  Laterality: N/A;  . ESOPHAGOGASTRODUODENOSCOPY (EGD) WITH PROPOFOL N/A 12/02/2018   Procedure: ESOPHAGOGASTRODUODENOSCOPY (EGD) WITH PROPOFOL with Dilation;  Surgeon: [name], MD;  Location[facility] ENDOSCOPY;  Service: Gastroenterology;  Laterality: N/A;  . ESOPHAGOGASTRODUODENOSCOPY (EGD) WITH PROPOFOL N/A 05/19/2019   Procedure: ESOPHAGOGASTRODUODENOSCOPY (EGD) WITH PROPOFOL;  Surgeon: [name], MD;  Location: [facility] ENDOSCOPY;  Service: Gastroenterology;  Laterality: N/A;  . ESOPHAGOGASTRODUODENOSCOPY (EGD) WITH PROPOFOL N/A 05/18/2019   Procedure: ESOPHAGOGASTRODUODENOSCOPY (EGD) WITH PROPOFOL with Dilation;  Surgeon: [name], MD;  Location: [facility] ENDOSCOPY;  Service: Gastroenterology;  Laterality: N/A;  . ESOPHAGOGASTRODUODENOSCOPY (EGD) WITH PROPOFOL N/A 06/25/2019   Procedure: ESOPHAGOGASTRODUODENOSCOPY (EGD) WITH PROPOFOL  with Dilation;  Surgeon: [name], MD;   Location: [facility] ENDOSCOPY;  Service: Gastroenterology;  Laterality: N/A;  . ESOPHAGOGASTRODUODENOSCOPY (EGD) WITH PROPOFOL N/A 07/09/2019   Procedure: ESOPHAGOGASTRODUODENOSCOPY (EGD) WITH PROPOFOL with Dilation;  Surgeon: [name], MD;  Location: [facility] ENDOSCOPY;  Service: Gastroenterology;  Laterality: N/A;  Pt requests early morning  . ESOPHAGOGASTRODUODENOSCOPY (EGD) WITH PROPOFOL N/A 08/11/2019   Procedure: ESOPHAGOGASTRODUODENOSCOPY (EGD) WITH PROPOFOL with Dilation;  Surgeon: [name], MD;  Location: [facility] ENDOSCOPY;  Service: Gastroenterology;  Laterality: N/A;  . ESOPHAGOGASTRODUODENOSCOPY (EGD) WITH PROPOFOL N/A 09/11/2019   Procedure: ESOPHAGOGASTRODUODENOSCOPY (EGD) WITH PROPOFOL with Dilation;  Surgeon: [name], MD;  Location: [facility] ENDOSCOPY;  Service: Gastroenterology;  Laterality: N/A;  . ESOPHAGOGASTRODUODENOSCOPY (EGD) WITH PROPOFOL N/A 10/02/2019   Procedure: ESOPHAGOGASTRODUODENOSCOPY (EGD) WITH PROPOFOL with Dilation;  Surgeon: [name], MD;  Location: [facility] ENDOSCOPY;  Service: Gastroenterology;  Laterality: N/A;  . ESOPHAGOGASTRODUODENOSCOPY (EGD) WITH PROPOFOL N/A 11/04/2019   Procedure: ESOPHAGOGASTRODUODENOSCOPY (EGD) WITH PROPOFOL;  Surgeon: [name], MD;  Location: [facility] ENDOSCOPY;  Service: Gastroenterology;  Laterality: N/A;  . ESOPHAGOGASTRODUODENOSCOPY (EGD) WITH PROPOFOL N/A 02/05/2020   Procedure: ESOPHAGOGASTRODUODENOSCOPY (EGD) WITH PROPOFOL;  Surgeon: [name], MD;  Location: [facility] ENDOSCOPY;  Service: Gastroenterology;  Laterality: N/A;  . ESOPHAGOGASTRODUODENOSCOPY (EGD) WITH PROPOFOL N/A 02/19/2020   Procedure: ESOPHAGOGASTRODUODENOSCOPY (EGD) WITH PROPOFOL;  Surgeon: [name], MD;  Location: [facility] ENDOSCOPY;  Service: Gastroenterology;  Laterality: N/A;  . ESOPHAGOGASTRODUODENOSCOPY (EGD) WITH PROPOFOL N/A 03/10/2020   Procedure: ESOPHAGOGASTRODUODENOSCOPY (EGD) WITH PROPOFOL;  Surgeon: [name], MD;  Location: [facility] ENDOSCOPY;  Service:  Gastroenterology;  Laterality: N/A;  . EYE SURGERY     retinal detatchment  . FRACTURE SURGERY    . radical prostate    . TONSILLECTOMY      Prior to Admission medications   Medication Sig Start Date  End Date Taking? Authorizing Provider  atorvastatin (LIPITOR) 40 MG tablet Take 1 tablet (40 mg total) by mouth daily at 6 PM. 09/18/18  Yes Fritzi Mandes, MD  famciclovir (FAMVIR) 250 MG tablet Take 250 mg by mouth 2 (two) times daily.   Yes [provider]  losartan (COZAAR) 50 MG tablet Take 50 mg by mouth daily. 08/20/18  Yes [provider]  metoprolol tartrate (LOPRESSOR) 25 MG tablet Take 25 mg by mouth 2 (two) times daily.   Yes [provider]  omeprazole (PRILOSEC) 20 MG capsule Take 20 mg by mouth daily.   Yes [provider]  aspirin EC 325 MG EC tablet Take 1 tablet (325 mg total) by mouth daily. 09/19/18   Fritzi Mandes, MD    Allergies as of 03/23/2020  . (No Known Allergies)    History reviewed. No pertinent family history.  Social History   Socioeconomic History  . Marital status: Married    Spouse name: Not on file  . Number of children: Not on file  . Years of education: Not on file  . Highest education level: Not on file  Occupational History  . Not on file  Tobacco Use  . Smoking status: Former Research scientist (life sciences)  . Smokeless tobacco: Never Used  Vaping Use  . Vaping Use: Never used  Substance and Sexual Activity  . Alcohol use: Not Currently  . Drug use: Never  . Sexual activity: Not on file  Other Topics Concern  . Not on file  Social History Narrative   Lives at home with wife   Social Determinants of Health   Financial Resource Strain: Not on file  Food Insecurity: Not on file  Transportation Needs: Not on file  Physical Activity: Not on file  Stress: Not on file  Social Connections: Not on file  Intimate Partner Violence: Not on file    Review of Systems: See HPI, otherwise negative ROS  Physical Exam: BP (!) 155/92    Pulse 62   Temp (!) 96.8 F (36 C) (Temporal)   Resp 16   Ht 6' (1.829 m)   Wt 90.7 kg   SpO2 100%   BMI 27.12 kg/m  General:   Alert,  pleasant and cooperative in NAD Head:  Normocephalic and atraumatic. Neck:  Supple; no masses or thyromegaly. Lungs:  Clear throughout to auscultation, normal respiratory effort.    Heart:  +S1, +S2, Regular rate and rhythm, No edema. Abdomen:  Soft, nontender and nondistended. Normal bowel sounds, without guarding, and without rebound.   Neurologic:  Alert and  oriented x4;  grossly normal neurologically.  Impression/Plan: Andrew Mullins is here for an endoscopy  to be performed for  evaluation of dysphagia    Risks, benefits, limitations, and alternatives regarding endoscopy have been reviewed with the patient.  Questions have been answered.  All parties agreeable.   [name], MD  03/31/2020, 7:38 AM\

## 2020-03-31 NOTE — Op Note (Signed)
[facility] Gastroenterology Patient Name: Andrew Mullins Procedure Date: 03/31/2020 7:34 AM MRN: [id number] Account #: [id number] Date of Birth: February 16, 1955 Admit Type: Outpatient Age: 65 Room: [facility] ENDO ROOM 3 Gender: Male Note Status: Finalized Procedure:             Upper GI endoscopy Indications:           Dysphagia Providers:             [name] MD, MD Referring MD:          Baxter Hire, MD (Referring MD) Medicines:             Monitored Anesthesia Care Complications:         No immediate complications. Procedure:             Pre-Anesthesia Assessment:                        - Prior to the procedure, a History and Physical was                         performed, and patient medications, allergies and                         sensitivities were reviewed. The patient's tolerance                         of previous anesthesia was reviewed.                        - The risks and benefits of the procedure and the                         sedation options and risks were discussed with the                         patient. All questions were answered and informed                         consent was obtained.                        - ASA Grade Assessment: II - A patient with mild                         systemic disease.                        After obtaining informed consent, the endoscope was                         passed under direct vision. Throughout the procedure,                         the patient's blood pressure, pulse, and oxygen                         saturations were monitored continuously. The Endoscope                         was introduced through the mouth, and advanced  to the                         lower third of esophagus. The upper GI endoscopy was                         accomplished with ease. The patient tolerated the                         procedure well. Findings:      One benign-appearing, intrinsic severe (stenosis; an endoscope  cannot       pass) stenosis was found in the upper third of the esophagus. This       stenosis measured 1 cm (inner diameter) x 1 cm (in length). The stenosis       was not traversed. A TTS dilator was passed through the scope. Dilation       with a 10-26-10 mm balloon dilator was performed to 12 mm. The dilation       site was examined and showed moderate mucosal disruption. Impression:            - Benign-appearing esophageal stenosis. Dilated.                        - No specimens collected. Recommendation:        - Discharge patient to home (with escort).                        - Resume previous diet.                        - Continue present medications.                        - Repeat upper endoscopy in 2 weeks for retreatment. Procedure Code(s):     --- Professional ---                        [phone], Esophagoscopy, flexible, transoral; with                         transendoscopic balloon dilation (less than 30 mm                         diameter) Diagnosis Code(s):     --- Professional ---                        K22.2, Esophageal obstruction                        R13.10, Dysphagia, unspecified CPT copyright 2019 American Medical Association. All rights reserved. The codes documented in this report are preliminary and upon coder review may  be revised to meet current compliance requirements. [name], MD [name] MD, MD 03/31/2020 7:56:10 AM This report has been signed electronically. Number of Addenda: 0 Note Initiated On: 03/31/2020 7:34 AM Estimated Blood Loss:  Estimated blood loss: none.      [facility]

## 2020-03-31 NOTE — Anesthesia Postprocedure Evaluation (Signed)
Anesthesia Post Note  Patient: Andrew Mullins  Procedure(s) Performed: ESOPHAGOGASTRODUODENOSCOPY (EGD) WITH PROPOFOL (N/A )  Patient location during evaluation: Endoscopy Anesthesia Type: General Level of consciousness: awake and alert and oriented Pain management: pain level controlled Vital Signs Assessment: post-procedure vital signs reviewed and stable Respiratory status: spontaneous breathing Cardiovascular status: blood pressure returned to baseline Anesthetic complications: no   No complications documented.   Last Vitals:  Vitals:   03/31/20 0807 03/31/20 0817  BP: 126/81 119/71  Pulse: 66 64  Resp: 16 13  Temp:    SpO2: 97% 99%    Last Pain:  Vitals:   03/31/20 0817  TempSrc:   PainSc: 0-No pain                 Delena Casebeer

## 2020-03-31 NOTE — Anesthesia Preprocedure Evaluation (Signed)
Anesthesia Evaluation  Patient identified by MRN, date of birth, ID band Patient awake    Reviewed: Allergy & Precautions, H&P , NPO status , reviewed documented beta blocker date and time   Airway Mallampati: II  TM Distance: >3 FB Neck ROM: full    Dental  (+) Teeth Intact   Pulmonary former smoker,    Pulmonary exam normal        Cardiovascular hypertension, Normal cardiovascular exam  09/2018 ECHO IMPRESSIONS    1. The left ventricle has normal systolic function with an ejection  fraction of 60-65%. The cavity size was normal. There is mildly increased  left ventricular wall thickness. Left ventricular diastolic Doppler  parameters are consistent with impaired  relaxation.  2. The right ventricle has normal systolic function. The cavity was  normal. There is no increase in right ventricular wall thickness. Right  ventricular systolic pressure is normal with an estimated pressure of 24.4  mmHg.    Neuro/Psych CVA negative psych ROS   GI/Hepatic Neg liver ROS, GERD  ,  Endo/Other  negative endocrine ROS  Renal/GU negative Renal ROS     Musculoskeletal   Abdominal   Peds negative pediatric ROS (+)  Hematology negative hematology ROS (+)   Anesthesia Other Findings Past Medical History: No date: Cancer ([place])     Comment:  esophageal No date: GERD (gastroesophageal reflux disease) No date: Hypertension No date: Prostate cancer ([place]) 2020: Stroke ([place])     Comment:  no residual weakness  Past Surgical History: No date: broken bones repair 02/18/2018: ESOPHAGOGASTRODUODENOSCOPY (EGD) WITH PROPOFOL; N/A     Comment:  Procedure: ESOPHAGOGASTRODUODENOSCOPY (EGD) WITH               PROPOFOL;  Surgeon: [name], MD;  Location: [facility]               ENDOSCOPY;  Service: Gastroenterology;  Laterality: N/A; 04/01/2018: ESOPHAGOGASTRODUODENOSCOPY (EGD) WITH PROPOFOL; N/A     Comment:  Procedure:  ESOPHAGOGASTRODUODENOSCOPY (EGD) WITH               PROPOFOL;  Surgeon: [name], MD;  Location: [facility]               ENDOSCOPY;  Service: Gastroenterology;  Laterality: N/A; 12/02/2018: ESOPHAGOGASTRODUODENOSCOPY (EGD) WITH PROPOFOL; N/A     Comment:  Procedure: ESOPHAGOGASTRODUODENOSCOPY (EGD) WITH               PROPOFOL with Dilation;  Surgeon: [name], MD;                Location: [facility] ENDOSCOPY;  Service: Gastroenterology;                Laterality: N/A; 05/19/2019: ESOPHAGOGASTRODUODENOSCOPY (EGD) WITH PROPOFOL; N/A     Comment:  Procedure: ESOPHAGOGASTRODUODENOSCOPY (EGD) WITH               PROPOFOL;  Surgeon: [name], MD;  Location: [facility]               ENDOSCOPY;  Service: Gastroenterology;  Laterality: N/A; 05/18/2019: ESOPHAGOGASTRODUODENOSCOPY (EGD) WITH PROPOFOL; N/A     Comment:  Procedure: ESOPHAGOGASTRODUODENOSCOPY (EGD) WITH               PROPOFOL with Dilation;  Surgeon: [name], MD;                Location: [facility] ENDOSCOPY;  Service: Gastroenterology;                Laterality: N/A; 06/25/2019:  ESOPHAGOGASTRODUODENOSCOPY (EGD) WITH PROPOFOL; N/A     Comment:  Procedure: ESOPHAGOGASTRODUODENOSCOPY (EGD) WITH               PROPOFOL  with Dilation;  Surgeon: [name], MD;                Location: [facility] ENDOSCOPY;  Service: Gastroenterology;                Laterality: N/A; 07/09/2019: ESOPHAGOGASTRODUODENOSCOPY (EGD) WITH PROPOFOL; N/A     Comment:  Procedure: ESOPHAGOGASTRODUODENOSCOPY (EGD) WITH               PROPOFOL with Dilation;  Surgeon: [name], MD;                Location: [facility] ENDOSCOPY;  Service: Gastroenterology;                Laterality: N/A;  Pt requests early morning 08/11/2019: ESOPHAGOGASTRODUODENOSCOPY (EGD) WITH PROPOFOL; N/A     Comment:  Procedure: ESOPHAGOGASTRODUODENOSCOPY (EGD) WITH               PROPOFOL with Dilation;  Surgeon: [name], MD;                Location: [facility] ENDOSCOPY;  Service: Gastroenterology;                 Laterality: N/A; 09/11/2019: ESOPHAGOGASTRODUODENOSCOPY (EGD) WITH PROPOFOL; N/A     Comment:  Procedure: ESOPHAGOGASTRODUODENOSCOPY (EGD) WITH               PROPOFOL with Dilation;  Surgeon: [name], MD;                Location: [facility] ENDOSCOPY;  Service: Gastroenterology;                Laterality: N/A; 10/02/2019: ESOPHAGOGASTRODUODENOSCOPY (EGD) WITH PROPOFOL; N/A     Comment:  Procedure: ESOPHAGOGASTRODUODENOSCOPY (EGD) WITH               PROPOFOL with Dilation;  Surgeon: [name], MD;                Location: [facility] ENDOSCOPY;  Service: Gastroenterology;                Laterality: N/A; 11/04/2019: ESOPHAGOGASTRODUODENOSCOPY (EGD) WITH PROPOFOL; N/A     Comment:  Procedure: ESOPHAGOGASTRODUODENOSCOPY (EGD) WITH               PROPOFOL;  Surgeon: [name], MD;  Location: [facility]               ENDOSCOPY;  Service: Gastroenterology;  Laterality: N/A; 02/05/2020: ESOPHAGOGASTRODUODENOSCOPY (EGD) WITH PROPOFOL; N/A     Comment:  Procedure: ESOPHAGOGASTRODUODENOSCOPY (EGD) WITH               PROPOFOL;  Surgeon: [name], MD;  Location: [facility]               ENDOSCOPY;  Service: Gastroenterology;  Laterality: N/A; 02/19/2020: ESOPHAGOGASTRODUODENOSCOPY (EGD) WITH PROPOFOL; N/A     Comment:  Procedure: ESOPHAGOGASTRODUODENOSCOPY (EGD) WITH               PROPOFOL;  Surgeon: [name], MD;  Location: [facility]               ENDOSCOPY;  Service: Gastroenterology;  Laterality: N/A; No date: EYE SURGERY     Comment:  retinal detatchment No date: FRACTURE SURGERY No date: radical prostate No date: TONSILLECTOMY  BMI    Body Mass Index: 27.12 kg/m  Reproductive/Obstetrics                             Anesthesia Physical  Anesthesia Plan  ASA: III  Anesthesia Plan: General   Post-op Pain Management:    Induction: Intravenous  PONV Risk Score and Plan: Treatment may vary due to age or medical condition and TIVA  Airway Management Planned: Nasal Cannula and  Natural Airway  Additional Equipment:   Intra-op Plan:   Post-operative Plan:   Informed Consent: I have reviewed the patients History and Physical, chart, labs and discussed the procedure including the risks, benefits and alternatives for the proposed anesthesia with the patient or authorized representative who has indicated his/her understanding and acceptance.     Dental Advisory Given  Plan Discussed with: CRNA  Anesthesia Plan Comments:         Anesthesia Quick Evaluation

## 2020-04-01 ENCOUNTER — Encounter: Payer: Self-pay | Admitting: Gastroenterology

## 2020-04-14 ENCOUNTER — Other Ambulatory Visit: Payer: Self-pay

## 2020-04-14 DIAGNOSIS — K222 Esophageal obstruction: Secondary | ICD-10-CM

## 2020-04-14 NOTE — Progress Notes (Signed)
Procedure was scheduled per Provider's request. Patient has been informed and instructions have been sent to my chart.

## 2020-04-20 ENCOUNTER — Other Ambulatory Visit: Payer: Self-pay

## 2020-04-20 ENCOUNTER — Other Ambulatory Visit
Admission: RE | Admit: 2020-04-20 | Discharge: 2020-04-20 | Disposition: A | Discharge status: Home / Self Care | Payer: BC Managed Care – PPO | Source: Ambulatory Visit | Attending: Gastroenterology | Admitting: Gastroenterology

## 2020-04-20 DIAGNOSIS — R1310 Dysphagia, unspecified: Secondary | ICD-10-CM | POA: Diagnosis not present

## 2020-04-20 DIAGNOSIS — Z79899 Other long term (current) drug therapy: Secondary | ICD-10-CM | POA: Diagnosis not present

## 2020-04-20 DIAGNOSIS — Z8546 Personal history of malignant neoplasm of prostate: Secondary | ICD-10-CM | POA: Diagnosis not present

## 2020-04-20 DIAGNOSIS — K219 Gastro-esophageal reflux disease without esophagitis: Secondary | ICD-10-CM | POA: Diagnosis not present

## 2020-04-20 DIAGNOSIS — Z7982 Long term (current) use of aspirin: Secondary | ICD-10-CM | POA: Diagnosis not present

## 2020-04-20 DIAGNOSIS — K222 Esophageal obstruction: Secondary | ICD-10-CM | POA: Diagnosis not present

## 2020-04-20 DIAGNOSIS — Z8501 Personal history of malignant neoplasm of esophagus: Secondary | ICD-10-CM | POA: Diagnosis not present

## 2020-04-20 DIAGNOSIS — Z01812 Encounter for preprocedural laboratory examination: Secondary | ICD-10-CM | POA: Insufficient documentation

## 2020-04-20 DIAGNOSIS — Z20822 Contact with and (suspected) exposure to covid-19: Secondary | ICD-10-CM | POA: Insufficient documentation

## 2020-04-20 DIAGNOSIS — I10 Essential (primary) hypertension: Secondary | ICD-10-CM | POA: Diagnosis not present

## 2020-04-20 DIAGNOSIS — Z87891 Personal history of nicotine dependence: Secondary | ICD-10-CM | POA: Diagnosis not present

## 2020-04-20 DIAGNOSIS — Z8673 Personal history of transient ischemic attack (TIA), and cerebral infarction without residual deficits: Secondary | ICD-10-CM | POA: Diagnosis not present

## 2020-04-21 LAB — SARS CORONAVIRUS 2 (TAT 6-24 HRS): SARS Coronavirus 2: NEGATIVE

## 2020-04-22 ENCOUNTER — Encounter: Payer: Self-pay | Admitting: Gastroenterology

## 2020-04-22 ENCOUNTER — Ambulatory Visit
Admission: RE | Admit: 2020-04-22 | Discharge: 2020-04-22 | Disposition: A | Discharge status: Home / Self Care | Payer: BC Managed Care – PPO | Attending: Gastroenterology | Admitting: Gastroenterology

## 2020-04-22 ENCOUNTER — Ambulatory Visit: Payer: BC Managed Care – PPO | Admitting: Certified Registered Nurse Anesthetist

## 2020-04-22 ENCOUNTER — Encounter
Admission: RE | Disposition: A | Discharge status: Home / Self Care | Payer: Self-pay | Source: Home / Self Care | Attending: Gastroenterology

## 2020-04-22 ENCOUNTER — Other Ambulatory Visit: Payer: Self-pay

## 2020-04-22 DIAGNOSIS — Z7982 Long term (current) use of aspirin: Secondary | ICD-10-CM | POA: Insufficient documentation

## 2020-04-22 DIAGNOSIS — Z8673 Personal history of transient ischemic attack (TIA), and cerebral infarction without residual deficits: Secondary | ICD-10-CM | POA: Insufficient documentation

## 2020-04-22 DIAGNOSIS — K222 Esophageal obstruction: Secondary | ICD-10-CM | POA: Insufficient documentation

## 2020-04-22 DIAGNOSIS — Z8546 Personal history of malignant neoplasm of prostate: Secondary | ICD-10-CM | POA: Insufficient documentation

## 2020-04-22 DIAGNOSIS — K219 Gastro-esophageal reflux disease without esophagitis: Secondary | ICD-10-CM | POA: Insufficient documentation

## 2020-04-22 DIAGNOSIS — Z87891 Personal history of nicotine dependence: Secondary | ICD-10-CM | POA: Insufficient documentation

## 2020-04-22 DIAGNOSIS — R1310 Dysphagia, unspecified: Secondary | ICD-10-CM | POA: Insufficient documentation

## 2020-04-22 DIAGNOSIS — Z8501 Personal history of malignant neoplasm of esophagus: Secondary | ICD-10-CM | POA: Insufficient documentation

## 2020-04-22 DIAGNOSIS — Z20822 Contact with and (suspected) exposure to covid-19: Secondary | ICD-10-CM | POA: Insufficient documentation

## 2020-04-22 DIAGNOSIS — I10 Essential (primary) hypertension: Secondary | ICD-10-CM | POA: Insufficient documentation

## 2020-04-22 DIAGNOSIS — Z79899 Other long term (current) drug therapy: Secondary | ICD-10-CM | POA: Insufficient documentation

## 2020-04-22 HISTORY — PX: ESOPHAGOGASTRODUODENOSCOPY (EGD) WITH PROPOFOL: SHX5813

## 2020-04-22 SURGERY — ESOPHAGOGASTRODUODENOSCOPY (EGD) WITH PROPOFOL
Anesthesia: General

## 2020-04-22 MED ORDER — SODIUM CHLORIDE 0.9 % IV SOLN
INTRAVENOUS | Status: DC
  Administered 2020-04-22: 1000 mL via INTRAVENOUS

## 2020-04-22 MED ORDER — PROPOFOL 500 MG/50ML IV EMUL
INTRAVENOUS | Status: AC
  Filled 2020-04-22: qty 50

## 2020-04-22 MED ORDER — PROPOFOL 500 MG/50ML IV EMUL
INTRAVENOUS | Status: DC | PRN
  Administered 2020-04-22: 200 ug/kg/min via INTRAVENOUS

## 2020-04-22 MED ORDER — PROPOFOL 10 MG/ML IV BOLUS
INTRAVENOUS | Status: DC | PRN
  Administered 2020-04-22: 100 mg via INTRAVENOUS

## 2020-04-22 NOTE — Anesthesia Procedure Notes (Signed)
Performed by: Demetrius Charity, CRNA Patient Re-evaluated:Patient Re-evaluated prior to induction Oxygen Delivery Method: Nasal cannula Placement Confirmation: CO2 detector and positive ETCO2

## 2020-04-22 NOTE — Anesthesia Preprocedure Evaluation (Signed)
Anesthesia Evaluation  Patient identified by MRN, date of birth, ID band Patient awake    Reviewed: Allergy & Precautions, H&P , NPO status , Patient's Chart, lab work & pertinent test results, reviewed documented beta blocker date and time   History of Anesthesia Complications Negative for: history of anesthetic complications  Airway Mallampati: II  TM Distance: >3 FB Neck ROM: full    Dental no notable dental hx.    Pulmonary neg sleep apnea, neg COPD, former smoker,    Pulmonary exam normal        Cardiovascular Exercise Tolerance: Good hypertension, On Medications (-) CAD, (-) Past MI, (-) Cardiac Stents and (-) CABG negative cardio ROS Normal cardiovascular exam Rhythm:regular Rate:Normal     Neuro/Psych neg Seizures CVA, No Residual Symptoms negative psych ROS   GI/Hepatic negative GI ROS, Neg liver ROS, GERD  Medicated,  Endo/Other  negative endocrine ROSneg diabetes  Renal/GU negative Renal ROS  negative genitourinary   Musculoskeletal   Abdominal (+) - obese,   Peds  Hematology negative hematology ROS (+)   Anesthesia Other Findings Past Medical History: No date: Cancer ([place])     Comment:  esophageal No date: GERD (gastroesophageal reflux disease) No date: Hypertension No date: Prostate cancer ([place]) 2020: Stroke ([place])     Comment:  no wealness Past Surgical History: No date: broken bones repair 02/18/2018: ESOPHAGOGASTRODUODENOSCOPY (EGD) WITH PROPOFOL; N/A     Comment:  Procedure: ESOPHAGOGASTRODUODENOSCOPY (EGD) WITH               PROPOFOL;  Surgeon: [name], MD;  Location: [facility]               ENDOSCOPY;  Service: Gastroenterology;  Laterality: N/A; 04/01/2018: ESOPHAGOGASTRODUODENOSCOPY (EGD) WITH PROPOFOL; N/A     Comment:  Procedure: ESOPHAGOGASTRODUODENOSCOPY (EGD) WITH               PROPOFOL;  Surgeon: [name], MD;  Location: [facility]               ENDOSCOPY;  Service: Gastroenterology;   Laterality: N/A; 12/02/2018: ESOPHAGOGASTRODUODENOSCOPY (EGD) WITH PROPOFOL; N/A     Comment:  Procedure: ESOPHAGOGASTRODUODENOSCOPY (EGD) WITH               PROPOFOL with Dilation;  Surgeon: [name], MD;                Location: [facility] ENDOSCOPY;  Service: Gastroenterology;                Laterality: N/A; 05/19/2019: ESOPHAGOGASTRODUODENOSCOPY (EGD) WITH PROPOFOL; N/A     Comment:  Procedure: ESOPHAGOGASTRODUODENOSCOPY (EGD) WITH               PROPOFOL;  Surgeon: [name], MD;  Location: [facility]               ENDOSCOPY;  Service: Gastroenterology;  Laterality: N/A; 05/18/2019: ESOPHAGOGASTRODUODENOSCOPY (EGD) WITH PROPOFOL; N/A     Comment:  Procedure: ESOPHAGOGASTRODUODENOSCOPY (EGD) WITH               PROPOFOL with Dilation;  Surgeon: [name], MD;                Location: [facility] ENDOSCOPY;  Service: Gastroenterology;                Laterality: N/A; 06/25/2019: ESOPHAGOGASTRODUODENOSCOPY (EGD) WITH PROPOFOL; N/A     Comment:  Procedure: ESOPHAGOGASTRODUODENOSCOPY (EGD) WITH               PROPOFOL  with  Dilation;  Surgeon: [name], MD;                Location: [facility] ENDOSCOPY;  Service: Gastroenterology;                Laterality: N/A; 07/09/2019: ESOPHAGOGASTRODUODENOSCOPY (EGD) WITH PROPOFOL; N/A     Comment:  Procedure: ESOPHAGOGASTRODUODENOSCOPY (EGD) WITH               PROPOFOL with Dilation;  Surgeon: [name], MD;                Location: [facility] ENDOSCOPY;  Service: Gastroenterology;                Laterality: N/A;  Pt requests early morning 08/11/2019: ESOPHAGOGASTRODUODENOSCOPY (EGD) WITH PROPOFOL; N/A     Comment:  Procedure: ESOPHAGOGASTRODUODENOSCOPY (EGD) WITH               PROPOFOL with Dilation;  Surgeon: [name], MD;                Location: [facility] ENDOSCOPY;  Service: Gastroenterology;                Laterality: N/A; 09/11/2019: ESOPHAGOGASTRODUODENOSCOPY (EGD) WITH PROPOFOL; N/A     Comment:  Procedure: ESOPHAGOGASTRODUODENOSCOPY (EGD) WITH               PROPOFOL  with Dilation;  Surgeon: [name], MD;                Location: [facility] ENDOSCOPY;  Service: Gastroenterology;                Laterality: N/A; 10/02/2019: ESOPHAGOGASTRODUODENOSCOPY (EGD) WITH PROPOFOL; N/A     Comment:  Procedure: ESOPHAGOGASTRODUODENOSCOPY (EGD) WITH               PROPOFOL with Dilation;  Surgeon: [name], MD;                Location: [facility] ENDOSCOPY;  Service: Gastroenterology;                Laterality: N/A; No date: EYE SURGERY     Comment:  retinal detatchment No date: FRACTURE SURGERY No date: radical prostate No date: TONSILLECTOMY BMI    Body Mass Index: 28.48 kg/m     Reproductive/Obstetrics negative OB ROS                             Anesthesia Physical  Anesthesia Plan  ASA: III  Anesthesia Plan: General   Post-op Pain Management:    Induction: Intravenous  PONV Risk Score and Plan: 1 and Propofol infusion  Airway Management Planned: Natural Airway  Additional Equipment:   Intra-op Plan:   Post-operative Plan:   Informed Consent: I have reviewed the patients History and Physical, chart, labs and discussed the procedure including the risks, benefits and alternatives for the proposed anesthesia with the patient or authorized representative who has indicated his/her understanding and acceptance.     Dental Advisory Given  Plan Discussed with: CRNA  Anesthesia Plan Comments:         Anesthesia Quick Evaluation

## 2020-04-22 NOTE — H&P (Signed)
[name], MD [address] [place], [place], [place], [address] [address] [place], [place], [place], [address] Phone: [phone]  Fax: [phone]  Primary Care Physician:  Baxter Hire, MD   Pre-Procedure History & Physical: HPI:  Andrew Mullins is a 65 y.o. male is here for an endoscopy    Past Medical History:  Diagnosis Date  . 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 GERD (gastroesophageal reflux disease)   . Hypertension   . Prostate cancer ([place])   . Stroke ([place]) 2020   no wealness    Past Surgical History:  Procedure Laterality Date  . broken bones repair    . ESOPHAGOGASTRODUODENOSCOPY (EGD) WITH PROPOFOL N/A 02/18/2018   Procedure: ESOPHAGOGASTRODUODENOSCOPY (EGD) WITH PROPOFOL;  Surgeon: [name], MD;  Location: [facility] ENDOSCOPY;  Service: Gastroenterology;  Laterality: N/A;  . ESOPHAGOGASTRODUODENOSCOPY (EGD) WITH PROPOFOL N/A 04/01/2018   Procedure: ESOPHAGOGASTRODUODENOSCOPY (EGD) WITH PROPOFOL;  Surgeon: [name], MD;  Location: [facility] ENDOSCOPY;  Service: Gastroenterology;  Laterality: N/A;  . ESOPHAGOGASTRODUODENOSCOPY (EGD) WITH PROPOFOL N/A 12/02/2018   Procedure: ESOPHAGOGASTRODUODENOSCOPY (EGD) WITH PROPOFOL with Dilation;  Surgeon: [name], MD;  Location: [facility] ENDOSCOPY;  Service: Gastroenterology;  Laterality: N/A;  . ESOPHAGOGASTRODUODENOSCOPY (EGD) WITH PROPOFOL N/A 05/19/2019   Procedure: ESOPHAGOGASTRODUODENOSCOPY (EGD) WITH PROPOFOL;  Surgeon: [name], MD;  Location: [facility] ENDOSCOPY;  Service: Gastroenterology;  Laterality: N/A;  . ESOPHAGOGASTRODUODENOSCOPY (EGD) WITH PROPOFOL N/A 05/18/2019   Procedure: ESOPHAGOGASTRODUODENOSCOPY (EGD) WITH PROPOFOL with Dilation;  Surgeon: [name], MD;  Location: [facility] ENDOSCOPY;  Service: Gastroenterology;  Laterality: N/A;  . ESOPHAGOGASTRODUODENOSCOPY (EGD) WITH PROPOFOL N/A 06/25/2019   Procedure: ESOPHAGOGASTRODUODENOSCOPY (EGD) WITH PROPOFOL  with Dilation;  Surgeon: [name], MD;   Location: [facility] ENDOSCOPY;  Service: Gastroenterology;  Laterality: N/A;  . ESOPHAGOGASTRODUODENOSCOPY (EGD) WITH PROPOFOL N/A 07/09/2019   Procedure: ESOPHAGOGASTRODUODENOSCOPY (EGD) WITH PROPOFOL with Dilation;  Surgeon: [name], MD;  Location: [facility] ENDOSCOPY;  Service: Gastroenterology;  Laterality: N/A;  Pt requests early morning  . ESOPHAGOGASTRODUODENOSCOPY (EGD) WITH PROPOFOL N/A 08/11/2019   Procedure: ESOPHAGOGASTRODUODENOSCOPY (EGD) WITH PROPOFOL with Dilation;  Surgeon: [name], MD;  Location: [facility] ENDOSCOPY;  Service: Gastroenterology;  Laterality: N/A;  . ESOPHAGOGASTRODUODENOSCOPY (EGD) WITH PROPOFOL N/A 09/11/2019   Procedure: ESOPHAGOGASTRODUODENOSCOPY (EGD) WITH PROPOFOL with Dilation;  Surgeon: [name], MD;  Location: [facility] ENDOSCOPY;  Service: Gastroenterology;  Laterality: N/A;  . ESOPHAGOGASTRODUODENOSCOPY (EGD) WITH PROPOFOL N/A 10/02/2019   Procedure: ESOPHAGOGASTRODUODENOSCOPY (EGD) WITH PROPOFOL with Dilation;  Surgeon: [name], MD;  Location: [facility] ENDOSCOPY;  Service: Gastroenterology;  Laterality: N/A;  . ESOPHAGOGASTRODUODENOSCOPY (EGD) WITH PROPOFOL N/A 11/04/2019   Procedure: ESOPHAGOGASTRODUODENOSCOPY (EGD) WITH PROPOFOL;  Surgeon: [name], MD;  Location: [facility] ENDOSCOPY;  Service: Gastroenterology;  Laterality: N/A;  . ESOPHAGOGASTRODUODENOSCOPY (EGD) WITH PROPOFOL N/A 02/05/2020   Procedure: ESOPHAGOGASTRODUODENOSCOPY (EGD) WITH PROPOFOL;  Surgeon: [name], MD;  Location: [facility] ENDOSCOPY;  Service: Gastroenterology;  Laterality: N/A;  . ESOPHAGOGASTRODUODENOSCOPY (EGD) WITH PROPOFOL N/A 02/19/2020   Procedure: ESOPHAGOGASTRODUODENOSCOPY (EGD) WITH PROPOFOL;  Surgeon: [name], MD;  Location: [facility] ENDOSCOPY;  Service: Gastroenterology;  Laterality: N/A;  . ESOPHAGOGASTRODUODENOSCOPY (EGD) WITH PROPOFOL N/A 03/10/2020   Procedure: ESOPHAGOGASTRODUODENOSCOPY (EGD) WITH PROPOFOL;  Surgeon: [name], MD;  Location: [facility] ENDOSCOPY;  Service:  Gastroenterology;  Laterality: N/A;  . ESOPHAGOGASTRODUODENOSCOPY (EGD) WITH PROPOFOL N/A 03/31/2020   Procedure: ESOPHAGOGASTRODUODENOSCOPY (EGD) WITH PROPOFOL;  Surgeon: [name], MD;  Location: [facility] ENDOSCOPY;  Service: Gastroenterology;  Laterality: N/A;  7:30 AM PROCEDURE PER DR Jerian Morais C-19 TEST ON  03/30/2020 AM  . EYE SURGERY     retinal detatchment  . FRACTURE SURGERY    . radical prostate    . TONSILLECTOMY      Prior to Admission medications   Medication Sig Start Date End Date Taking? Authorizing Provider  atorvastatin (LIPITOR) 40 MG tablet Take 1 tablet (40 mg total) by mouth daily at 6 PM. 09/18/18  Yes Fritzi Mandes, MD  famciclovir (FAMVIR) 250 MG tablet Take 250 mg by mouth 2 (two) times daily.   Yes [provider]  losartan (COZAAR) 50 MG tablet Take 50 mg by mouth daily. 08/20/18  Yes [provider]  metoprolol tartrate (LOPRESSOR) 25 MG tablet Take 25 mg by mouth 2 (two) times daily.   Yes [provider]  omeprazole (PRILOSEC) 20 MG capsule Take 20 mg by mouth daily.   Yes [provider]  aspirin EC 325 MG EC tablet Take 1 tablet (325 mg total) by mouth daily. Patient not taking: Reported on 04/22/2020 09/19/18   Fritzi Mandes, MD    Allergies as of 04/15/2020  . (No Known Allergies)    History reviewed. No pertinent family history.  Social History   Socioeconomic History  . Marital status: Married    Spouse name: Not on file  . Number of children: Not on file  . Years of education: Not on file  . Highest education level: Not on file  Occupational History  . Not on file  Tobacco Use  . Smoking status: Former Research scientist (life sciences)  . Smokeless tobacco: Never Used  Vaping Use  . Vaping Use: Never used  Substance and Sexual Activity  . Alcohol use: Not Currently  . Drug use: Never  . Sexual activity: Not on file  Other Topics Concern  . Not on file  Social History Narrative   Lives at home with wife   Social Determinants of Health    Financial Resource Strain: Not on file  Food Insecurity: Not on file  Transportation Needs: Not on file  Physical Activity: Not on file  Stress: Not on file  Social Connections: Not on file  Intimate Partner Violence: Not on file    Review of Systems: See HPI, otherwise negative ROS  Physical Exam: BP (!) 170/97   Pulse 67   Temp (!) 97.5 F (36.4 C) (Temporal)   Resp 18   Ht 6' (1.829 m)   Wt 93 kg   SpO2 99%   BMI 27.80 kg/m  General:   Alert,  pleasant and cooperative in NAD Head:  Normocephalic and atraumatic. Neck:  Supple; no masses or thyromegaly. Lungs:  Clear throughout to auscultation, normal respiratory effort.    Heart:  +S1, +S2, Regular rate and rhythm, No edema. Abdomen:  Soft, nontender and nondistended. Normal bowel sounds, without guarding, and without rebound.   Neurologic:  Alert and  oriented x4;  grossly normal neurologically.  Impression/Plan: Andrew Mullins is here for an endoscopy  to be performed for  evaluation of dysphagia    Risks, benefits, limitations, and alternatives regarding endoscopy have been reviewed with the patient.  Questions have been answered.  All parties agreeable.   [name], MD  04/22/2020, 8:22 AM

## 2020-04-22 NOTE — Anesthesia Postprocedure Evaluation (Signed)
Anesthesia Post Note  Patient: Andrew Mullins  Procedure(s) Performed: ESOPHAGOGASTRODUODENOSCOPY (EGD) WITH PROPOFOL (N/A )  Patient location during evaluation: Endoscopy Anesthesia Type: General Level of consciousness: awake and alert and oriented Pain management: pain level controlled Vital Signs Assessment: post-procedure vital signs reviewed and stable Respiratory status: spontaneous breathing, nonlabored ventilation and respiratory function stable Cardiovascular status: blood pressure returned to baseline and stable Postop Assessment: no signs of nausea or vomiting Anesthetic complications: no   No complications documented.   Last Vitals:  Vitals:   04/22/20 0920 04/22/20 0930  BP: (!) 147/85 (!) 135/94  Pulse: 64 (!) 59  Resp: 19 17  Temp:    SpO2: 99% 100%    Last Pain:  Vitals:   04/22/20 0930  TempSrc:   PainSc: 0-No pain                 Blakeley Margraf

## 2020-04-22 NOTE — Op Note (Signed)
[facility] Gastroenterology Patient Name: Andrew Mullins Procedure Date: 04/22/2020 8:45 AM MRN: [id number] Account #: [id number] Date of Birth: 18-Feb-1955 Admit Type: Outpatient Age: 65 Room: [facility] ENDO ROOM 4 Gender: Male Note Status: Finalized Procedure:             Upper GI endoscopy Indications:           Dysphagia Providers:             [name] MD, MD Medicines:             Monitored Anesthesia Care Complications:         No immediate complications. Procedure:             Pre-Anesthesia Assessment:                        - Prior to the procedure, a History and Physical was                         performed, and patient medications, allergies and                         sensitivities were reviewed. The patient's tolerance                         of previous anesthesia was reviewed.                        - The risks and benefits of the procedure and the                         sedation options and risks were discussed with the                         patient. All questions were answered and informed                         consent was obtained.                        - After reviewing the risks and benefits, the patient                         was deemed in satisfactory condition to undergo the                         procedure.                        - ASA Grade Assessment: II - A patient with mild                         systemic disease.                        After obtaining informed consent, the endoscope was                         passed under direct vision. Throughout the procedure,  the patient's blood pressure, pulse, and oxygen                         saturations were monitored continuously. The Endoscope                         was introduced through the mouth, and advanced to the                         middle third of esophagus. The upper GI endoscopy was                         accomplished with ease. The patient  tolerated the                         procedure well. Findings:      One benign-appearing, intrinsic severe (stenosis; an endoscope cannot       pass) stenosis was found in the upper third of the esophagus. This       stenosis measured 8 mm (inner diameter) x 1 cm (in length). The stenosis       was not traversed. A TTS dilator was passed through the scope. Dilation       with an 08-23-08 mm balloon and a 10-26-10 mm balloon dilator was       performed to 11 mm. The dilation site was examined and showed moderate       mucosal disruption. Impression:            - Benign-appearing esophageal stenosis. Dilated.                        - No specimens collected. Recommendation:        - Discharge patient to home (with escort).                        - Resume previous diet.                        - Continue present medications.                        - Repeat upper endoscopy in 2 weeks to evaluate the                         response to therapy. Procedure Code(s):     --- Professional ---                        [phone], Esophagoscopy, flexible, transoral; with                         transendoscopic balloon dilation (less than 30 mm                         diameter) Diagnosis Code(s):     --- Professional ---                        K22.2, Esophageal obstruction                        R13.10, Dysphagia,  unspecified CPT copyright 2019 American Medical Association. All rights reserved. The codes documented in this report are preliminary and upon coder review may  be revised to meet current compliance requirements. [name], MD [name] MD, MD 04/22/2020 9:07:58 AM This report has been signed electronically. Number of Addenda: 0 Note Initiated On: 04/22/2020 8:45 AM Estimated Blood Loss:  Estimated blood loss: none.      [facility]

## 2020-04-22 NOTE — Transfer of Care (Signed)
Immediate Anesthesia Transfer of Care Note  Patient: Andrew Mullins  Procedure(s) Performed: ESOPHAGOGASTRODUODENOSCOPY (EGD) WITH PROPOFOL (N/A )  Patient Location: PACU  Anesthesia Type:General  Level of Consciousness: sedated  Airway & Oxygen Therapy: Patient Spontanous Breathing and Patient connected to nasal cannula oxygen  Post-op Assessment: Report given to RN and Post -op Vital signs reviewed and stable  Post vital signs: Reviewed and stable  Last Vitals:  Vitals Value Taken Time  BP    Temp    Pulse 78 04/22/20 0909  Resp 26 04/22/20 0909  SpO2 93 % 04/22/20 0909  Vitals shown include unvalidated device data.  Last Pain:  Vitals:   04/22/20 0756  TempSrc: Temporal  PainSc: 2          Complications: No complications documented.

## 2020-04-23 NOTE — Progress Notes (Signed)
Voicemail. No message left.

## 2020-04-25 ENCOUNTER — Encounter: Payer: Self-pay | Admitting: Gastroenterology

## 2020-05-10 ENCOUNTER — Encounter: Payer: Self-pay | Admitting: Gastroenterology

## 2020-05-19 ENCOUNTER — Other Ambulatory Visit: Payer: Self-pay

## 2020-05-19 ENCOUNTER — Telehealth: Payer: Self-pay

## 2020-05-19 DIAGNOSIS — K222 Esophageal obstruction: Secondary | ICD-10-CM

## 2020-05-19 NOTE — Telephone Encounter (Signed)
Per Dr. Vicente Males, he would like patient to repeat EGD. LVM to call office back to schedule.

## 2020-05-25 ENCOUNTER — Encounter: Payer: Self-pay | Admitting: Gastroenterology

## 2020-05-26 ENCOUNTER — Encounter
Admission: RE | Disposition: A | Discharge status: Home / Self Care | Payer: Self-pay | Source: Home / Self Care | Attending: Gastroenterology

## 2020-05-26 ENCOUNTER — Other Ambulatory Visit: Payer: Self-pay

## 2020-05-26 ENCOUNTER — Ambulatory Visit
Admission: RE | Admit: 2020-05-26 | Discharge: 2020-05-26 | Disposition: A | Discharge status: Home / Self Care | Payer: BC Managed Care – PPO | Attending: Gastroenterology | Admitting: Gastroenterology

## 2020-05-26 ENCOUNTER — Encounter: Payer: Self-pay | Admitting: Gastroenterology

## 2020-05-26 ENCOUNTER — Ambulatory Visit: Payer: BC Managed Care – PPO | Admitting: Anesthesiology

## 2020-05-26 DIAGNOSIS — Z79899 Other long term (current) drug therapy: Secondary | ICD-10-CM | POA: Diagnosis not present

## 2020-05-26 DIAGNOSIS — Z87891 Personal history of nicotine dependence: Secondary | ICD-10-CM | POA: Diagnosis not present

## 2020-05-26 DIAGNOSIS — K222 Esophageal obstruction: Secondary | ICD-10-CM | POA: Insufficient documentation

## 2020-05-26 DIAGNOSIS — R1310 Dysphagia, unspecified: Secondary | ICD-10-CM | POA: Diagnosis present

## 2020-05-26 HISTORY — PX: ESOPHAGOGASTRODUODENOSCOPY (EGD) WITH PROPOFOL: SHX5813

## 2020-05-26 SURGERY — ESOPHAGOGASTRODUODENOSCOPY (EGD) WITH PROPOFOL
Anesthesia: General

## 2020-05-26 MED ORDER — PROPOFOL 10 MG/ML IV BOLUS
INTRAVENOUS | Status: DC | PRN
  Administered 2020-05-26: 100 mg via INTRAVENOUS

## 2020-05-26 MED ORDER — LIDOCAINE 2% (20 MG/ML) 5 ML SYRINGE
INTRAMUSCULAR | Status: DC | PRN
  Administered 2020-05-26: 25 mg via INTRAVENOUS

## 2020-05-26 MED ORDER — SODIUM CHLORIDE 0.9 % IV SOLN: INTRAVENOUS | Status: DC

## 2020-05-26 MED ORDER — PROPOFOL 500 MG/50ML IV EMUL
INTRAVENOUS | Status: DC | PRN
  Administered 2020-05-26: 120 ug/kg/min via INTRAVENOUS

## 2020-05-26 NOTE — H&P (Signed)
[name], MD [address] [place], [place], [place], [address] [address] [place], [place], [place], [address] Phone: [phone]  Fax: [phone]  Primary Care Physician:  Baxter Hire, MD   Pre-Procedure History & Physical: HPI:  Andrew Mullins is a 65 y.o. male is here for an endoscopy    Past Medical History:  Diagnosis Date  . Cancer (Andrew Mullins)    esophageal  . GERD (gastroesophageal reflux disease)   . Hypertension   . Prostate cancer (Andrew Mullins)   . Stroke (Andrew Mullins) 2020   no wealness    Past Surgical History:  Procedure Laterality Date  . broken bones repair    . ESOPHAGOGASTRODUODENOSCOPY (EGD) WITH PROPOFOL N/A 02/18/2018   Procedure: ESOPHAGOGASTRODUODENOSCOPY (EGD) WITH PROPOFOL;  Surgeon: [name], MD;  Location: [facility] ENDOSCOPY;  Service: Gastroenterology;  Laterality: N/A;  . ESOPHAGOGASTRODUODENOSCOPY (EGD) WITH PROPOFOL N/A 04/01/2018   Procedure: ESOPHAGOGASTRODUODENOSCOPY (EGD) WITH PROPOFOL;  Surgeon: [name], MD;  Location: [facility] ENDOSCOPY;  Service: Gastroenterology;  Laterality: N/A;  . ESOPHAGOGASTRODUODENOSCOPY (EGD) WITH PROPOFOL N/A 12/02/2018   Procedure: ESOPHAGOGASTRODUODENOSCOPY (EGD) WITH PROPOFOL with Dilation;  Surgeon: [name], MD;  Location[facility] ENDOSCOPY;  Service: Gastroenterology;  Laterality: N/A;  . ESOPHAGOGASTRODUODENOSCOPY (EGD) WITH PROPOFOL N/A 05/19/2019   Procedure: ESOPHAGOGASTRODUODENOSCOPY (EGD) WITH PROPOFOL;  Surgeon: [name], MD;  Location: [facility] ENDOSCOPY;  Service: Gastroenterology;  Laterality: N/A;  . ESOPHAGOGASTRODUODENOSCOPY (EGD) WITH PROPOFOL N/A 05/18/2019   Procedure: ESOPHAGOGASTRODUODENOSCOPY (EGD) WITH PROPOFOL with Dilation;  Surgeon: [name], MD;  Location: [facility] ENDOSCOPY;  Service: Gastroenterology;  Laterality: N/A;  . ESOPHAGOGASTRODUODENOSCOPY (EGD) WITH PROPOFOL N/A 06/25/2019   Procedure: ESOPHAGOGASTRODUODENOSCOPY (EGD) WITH PROPOFOL  with Dilation;  Surgeon: [name], MD;   Location: [facility] ENDOSCOPY;  Service: Gastroenterology;  Laterality: N/A;  . ESOPHAGOGASTRODUODENOSCOPY (EGD) WITH PROPOFOL N/A 07/09/2019   Procedure: ESOPHAGOGASTRODUODENOSCOPY (EGD) WITH PROPOFOL with Dilation;  Surgeon: [name], MD;  Location: [facility] ENDOSCOPY;  Service: Gastroenterology;  Laterality: N/A;  Pt requests early morning  . ESOPHAGOGASTRODUODENOSCOPY (EGD) WITH PROPOFOL N/A 08/11/2019   Procedure: ESOPHAGOGASTRODUODENOSCOPY (EGD) WITH PROPOFOL with Dilation;  Surgeon: [name], MD;  Location: [facility] ENDOSCOPY;  Service: Gastroenterology;  Laterality: N/A;  . ESOPHAGOGASTRODUODENOSCOPY (EGD) WITH PROPOFOL N/A 09/11/2019   Procedure: ESOPHAGOGASTRODUODENOSCOPY (EGD) WITH PROPOFOL with Dilation;  Surgeon: [name], MD;  Location: [facility] ENDOSCOPY;  Service: Gastroenterology;  Laterality: N/A;  . ESOPHAGOGASTRODUODENOSCOPY (EGD) WITH PROPOFOL N/A 10/02/2019   Procedure: ESOPHAGOGASTRODUODENOSCOPY (EGD) WITH PROPOFOL with Dilation;  Surgeon: [name], MD;  Location: [facility] ENDOSCOPY;  Service: Gastroenterology;  Laterality: N/A;  . ESOPHAGOGASTRODUODENOSCOPY (EGD) WITH PROPOFOL N/A 11/04/2019   Procedure: ESOPHAGOGASTRODUODENOSCOPY (EGD) WITH PROPOFOL;  Surgeon: [name], MD;  Location: [facility] ENDOSCOPY;  Service: Gastroenterology;  Laterality: N/A;  . ESOPHAGOGASTRODUODENOSCOPY (EGD) WITH PROPOFOL N/A 02/05/2020   Procedure: ESOPHAGOGASTRODUODENOSCOPY (EGD) WITH PROPOFOL;  Surgeon: [name], MD;  Location: [facility] ENDOSCOPY;  Service: Gastroenterology;  Laterality: N/A;  . ESOPHAGOGASTRODUODENOSCOPY (EGD) WITH PROPOFOL N/A 02/19/2020   Procedure: ESOPHAGOGASTRODUODENOSCOPY (EGD) WITH PROPOFOL;  Surgeon: [name], MD;  Location: [facility] ENDOSCOPY;  Service: Gastroenterology;  Laterality: N/A;  . ESOPHAGOGASTRODUODENOSCOPY (EGD) WITH PROPOFOL N/A 03/10/2020   Procedure: ESOPHAGOGASTRODUODENOSCOPY (EGD) WITH PROPOFOL;  Surgeon: [name], MD;  Location: [facility] ENDOSCOPY;  Service:  Gastroenterology;  Laterality: N/A;  . ESOPHAGOGASTRODUODENOSCOPY (EGD) WITH PROPOFOL N/A 03/31/2020   Procedure: ESOPHAGOGASTRODUODENOSCOPY (EGD) WITH PROPOFOL;  Surgeon: [name], MD;  Location: [facility] ENDOSCOPY;  Service: Gastroenterology;  Laterality: N/A;  7:30 AM PROCEDURE PER DR Rebecah Dangerfield C-19 TEST ON  03/30/2020 AM  . ESOPHAGOGASTRODUODENOSCOPY (EGD) WITH PROPOFOL N/A 04/22/2020   Procedure: ESOPHAGOGASTRODUODENOSCOPY (EGD) WITH PROPOFOL;  Surgeon: [name], MD;  Location: [facility] ENDOSCOPY;  Service: Gastroenterology;  Laterality: N/A;  . EYE SURGERY     retinal detatchment  . FRACTURE SURGERY    . radical prostate    . TONSILLECTOMY      Prior to Admission medications   Medication Sig Start Date End Date Taking? Authorizing Provider  atorvastatin (LIPITOR) 40 MG tablet Take 1 tablet (40 mg total) by mouth daily at 6 PM. 09/18/18  Yes Fritzi Mandes, MD  gabapentin (NEURONTIN) 300 MG capsule Take 300 mg by mouth 3 (three) times daily.   Yes [provider]  losartan (COZAAR) 50 MG tablet Take 50 mg by mouth daily. 08/20/18  Yes [provider]  metoprolol tartrate (LOPRESSOR) 25 MG tablet Take 25 mg by mouth 2 (two) times daily.   Yes [provider]  omeprazole (PRILOSEC) 20 MG capsule Take 20 mg by mouth daily.   Yes [provider]  aspirin EC 325 MG EC tablet Take 1 tablet (325 mg total) by mouth daily. Patient not taking: Reported on 04/22/2020 09/19/18   Fritzi Mandes, MD  famciclovir [facility]) 250 MG tablet Take 250 mg by mouth 2 (two) times daily.    [provider]    Allergies as of 05/19/2020  . (No Known Allergies)    History reviewed. No pertinent family history.  Social History   Socioeconomic History  . Marital status: Married    Spouse name: Not on file  . Number of children: Not on file  . Years of education: Not on file  . Highest education level: Not on file  Occupational History  . Not on file  Tobacco Use  . Smoking status:  Former Research scientist (life sciences)  . Smokeless tobacco: Never Used  Vaping Use  . Vaping Use: Never used  Substance and Sexual Activity  . Alcohol use: Not Currently  . Drug use: Never  . Sexual activity: Not on file  Other Topics Concern  . Not on file  Social History Narrative   Lives at home with wife   Social Determinants of Health   Financial Resource Strain: Not on file  Food Insecurity: Not on file  Transportation Needs: Not on file  Physical Activity: Not on file  Stress: Not on file  Social Connections: Not on file  Intimate Partner Violence: Not on file    Review of Systems: See HPI, otherwise negative ROS  Physical Exam: BP (!) 116/95   Pulse (!) 55   Temp (!) 96.6 F (35.9 C) (Temporal)   Resp 16   Ht 6' (1.829 m)   Wt 93 kg   SpO2 100%   BMI 27.80 kg/m  General:   Alert,  pleasant and cooperative in NAD Head:  Normocephalic and atraumatic. Neck:  Supple; no masses or thyromegaly. Lungs:  Clear throughout to auscultation, normal respiratory effort.    Heart:  +S1, +S2, Regular rate and rhythm, No edema. Abdomen:  Soft, nontender and nondistended. Normal bowel sounds, without guarding, and without rebound.   Neurologic:  Alert and  oriented x4;  grossly normal neurologically.  Impression/Plan: Andrew Mullins is here for an endoscopy  to be performed for  evaluation of dysphagia.    Risks, benefits, limitations, and alternatives regarding endoscopy have been reviewed with the patient.  Questions have been answered.  All parties agreeable.   [name], MD  05/26/2020, 10:10 AM

## 2020-05-26 NOTE — Transfer of Care (Signed)
Immediate Anesthesia Transfer of Care Note  Patient: Andrew Mullins  Procedure(s) Performed: ESOPHAGOGASTRODUODENOSCOPY (EGD) WITH PROPOFOL (N/A )  Patient Location: Endoscopy Unit  Anesthesia Type:General  Level of Consciousness: awake and alert   Airway & Oxygen Therapy: Patient Spontanous Breathing  Post-op Assessment: Report given to RN and Post -op Vital signs reviewed and stable  Post vital signs: Reviewed  Last Vitals:  Vitals Value Taken Time  BP    Temp    Pulse    Resp    SpO2      Last Pain:  Vitals:   05/26/20 1005  TempSrc: Temporal  PainSc: 0-No pain         Complications: No complications documented.

## 2020-05-26 NOTE — Op Note (Signed)
[facility] Gastroenterology Patient Name: Andrew Mullins Procedure Date: 05/26/2020 10:22 AM MRN: [id number] Account #: [id number] Date of Birth: [birth year] Admit Type: Outpatient Age: 65 Room: [facility] ENDO ROOM 3 Gender: Male Note Status: Finalized Procedure:             Upper GI endoscopy Indications:           Dysphagia Providers:             [name] MD, MD Referring MD:          Biagio Borg, MD (Referring MD) Medicines:             Monitored Anesthesia Care Complications:         No immediate complications. Procedure:             Pre-Anesthesia Assessment:                        - Prior to the procedure, a History and Physical was                         performed, and patient medications, allergies and                         sensitivities were reviewed. The patient's tolerance                         of previous anesthesia was reviewed.                        - The risks and benefits of the procedure and the                         sedation options and risks were discussed with the                         patient. All questions were answered and informed                         consent was obtained.                        - ASA Grade Assessment: II - A patient with mild                         systemic disease.                        After obtaining informed consent, the endoscope was                         passed under direct vision. Throughout the procedure,                         the patient's blood pressure, pulse, and oxygen                         saturations were monitored continuously. The Endoscope                         was introduced through the mouth, and advanced  to the                         third part of duodenum. The upper GI endoscopy was                         accomplished with ease. The patient tolerated the                         procedure well. Findings:      One benign-appearing, intrinsic severe stenosis was found 20 to 22  cm       from the incisors. This stenosis measured 7 mm (inner diameter) x 1 cm       (in length). The stenosis was traversed after downsizing scope. A TTS       dilator was passed through the scope. Dilation with an 08-23-08 mm balloon       dilator was performed to 10 mm. The dilation site was examined and       showed moderate mucosal disruption.      The cardia and gastric fundus were normal on retroflexion. Impression:            - Benign-appearing esophageal stenosis. Dilated.                        - No specimens collected. Recommendation:        - Discharge patient to home (with escort).                        - Resume previous diet.                        - Continue present medications. Procedure Code(s):     --- Professional ---                        [phone], Esophagogastroduodenoscopy, flexible,                         transoral; with transendoscopic balloon dilation of                         esophagus (less than 30 mm diameter) Diagnosis Code(s):     --- Professional ---                        K22.2, Esophageal obstruction                        R13.10, Dysphagia, unspecified CPT copyright 2019 American Medical Association. All rights reserved. The codes documented in this report are preliminary and upon coder review may  be revised to meet current compliance requirements. [name], MD [name] MD, MD 05/26/2020 10:41:59 AM This report has been signed electronically. Number of Addenda: 0 Note Initiated On: 05/26/2020 10:22 AM Estimated Blood Loss:  Estimated blood loss: none.      [facility]

## 2020-05-26 NOTE — Anesthesia Preprocedure Evaluation (Signed)
Anesthesia Evaluation  Patient identified by MRN, date of birth, ID band Patient awake    Reviewed: Allergy & Precautions, H&P , NPO status , Patient's Chart, lab work & pertinent test results, reviewed documented beta blocker date and time   Airway Mallampati: III   Neck ROM: full    Dental  (+) Poor Dentition   Pulmonary neg pulmonary ROS, former smoker,    Pulmonary exam normal        Cardiovascular hypertension, negative cardio ROS Normal cardiovascular exam Rhythm:regular Rate:Normal     Neuro/Psych CVA, Residual Symptoms negative psych ROS   GI/Hepatic Neg liver ROS, GERD  Medicated,  Endo/Other  negative endocrine ROS  Renal/GU negative Renal ROS  negative genitourinary   Musculoskeletal   Abdominal   Peds  Hematology negative hematology ROS (+)   Anesthesia Other Findings Past Medical History: No date: Cancer ([place])     Comment:  esophageal No date: GERD (gastroesophageal reflux disease) No date: Hypertension No date: Prostate cancer ([place]) 2020: Stroke ([place])     Comment:  no wealness Past Surgical History: No date: broken bones repair 02/18/2018: ESOPHAGOGASTRODUODENOSCOPY (EGD) WITH PROPOFOL; N/A     Comment:  Procedure: ESOPHAGOGASTRODUODENOSCOPY (EGD) WITH               PROPOFOL;  Surgeon: [name], MD;  Location: [facility]               ENDOSCOPY;  Service: Gastroenterology;  Laterality: N/A; 04/01/2018: ESOPHAGOGASTRODUODENOSCOPY (EGD) WITH PROPOFOL; N/A     Comment:  Procedure: ESOPHAGOGASTRODUODENOSCOPY (EGD) WITH               PROPOFOL;  Surgeon: [name], MD;  Location: [facility]               ENDOSCOPY;  Service: Gastroenterology;  Laterality: N/A; 12/02/2018: ESOPHAGOGASTRODUODENOSCOPY (EGD) WITH PROPOFOL; N/A     Comment:  Procedure: ESOPHAGOGASTRODUODENOSCOPY (EGD) WITH               PROPOFOL with Dilation;  Surgeon: [name], MD;                Location: [facility] ENDOSCOPY;  Service:  Gastroenterology;                Laterality: N/A; 05/19/2019: ESOPHAGOGASTRODUODENOSCOPY (EGD) WITH PROPOFOL; N/A     Comment:  Procedure: ESOPHAGOGASTRODUODENOSCOPY (EGD) WITH               PROPOFOL;  Surgeon: [name], MD;  Location: [facility]               ENDOSCOPY;  Service: Gastroenterology;  Laterality: N/A; 05/18/2019: ESOPHAGOGASTRODUODENOSCOPY (EGD) WITH PROPOFOL; N/A     Comment:  Procedure: ESOPHAGOGASTRODUODENOSCOPY (EGD) WITH               PROPOFOL with Dilation;  Surgeon: [name], MD;                Location: [facility] ENDOSCOPY;  Service: Gastroenterology;                Laterality: N/A; 06/25/2019: ESOPHAGOGASTRODUODENOSCOPY (EGD) WITH PROPOFOL; N/A     Comment:  Procedure: ESOPHAGOGASTRODUODENOSCOPY (EGD) WITH               PROPOFOL  with Dilation;  Surgeon: [name], MD;                Location: [facility] ENDOSCOPY;  Service: Gastroenterology;                Laterality:  N/A; 07/09/2019: ESOPHAGOGASTRODUODENOSCOPY (EGD) WITH PROPOFOL; N/A     Comment:  Procedure: ESOPHAGOGASTRODUODENOSCOPY (EGD) WITH               PROPOFOL with Dilation;  Surgeon: [name], MD;                Location: [facility] ENDOSCOPY;  Service: Gastroenterology;                Laterality: N/A;  Pt requests early morning 08/11/2019: ESOPHAGOGASTRODUODENOSCOPY (EGD) WITH PROPOFOL; N/A     Comment:  Procedure: ESOPHAGOGASTRODUODENOSCOPY (EGD) WITH               PROPOFOL with Dilation;  Surgeon: [name], MD;                Location: [facility] ENDOSCOPY;  Service: Gastroenterology;                Laterality: N/A; 09/11/2019: ESOPHAGOGASTRODUODENOSCOPY (EGD) WITH PROPOFOL; N/A     Comment:  Procedure: ESOPHAGOGASTRODUODENOSCOPY (EGD) WITH               PROPOFOL with Dilation;  Surgeon: [name], MD;                Location: [facility] ENDOSCOPY;  Service: Gastroenterology;                Laterality: N/A; 10/02/2019: ESOPHAGOGASTRODUODENOSCOPY (EGD) WITH PROPOFOL; N/A     Comment:  Procedure: ESOPHAGOGASTRODUODENOSCOPY  (EGD) WITH               PROPOFOL with Dilation;  Surgeon: [name], MD;                Location: [facility] ENDOSCOPY;  Service: Gastroenterology;                Laterality: N/A; 11/04/2019: ESOPHAGOGASTRODUODENOSCOPY (EGD) WITH PROPOFOL; N/A     Comment:  Procedure: ESOPHAGOGASTRODUODENOSCOPY (EGD) WITH               PROPOFOL;  Surgeon: [name], MD;  Location: [facility]               ENDOSCOPY;  Service: Gastroenterology;  Laterality: N/A; 02/05/2020: ESOPHAGOGASTRODUODENOSCOPY (EGD) WITH PROPOFOL; N/A     Comment:  Procedure: ESOPHAGOGASTRODUODENOSCOPY (EGD) WITH               PROPOFOL;  Surgeon: [name], MD;  Location: [facility]               ENDOSCOPY;  Service: Gastroenterology;  Laterality: N/A; 02/19/2020: ESOPHAGOGASTRODUODENOSCOPY (EGD) WITH PROPOFOL; N/A     Comment:  Procedure: ESOPHAGOGASTRODUODENOSCOPY (EGD) WITH               PROPOFOL;  Surgeon: [name], MD;  Location: [facility]               ENDOSCOPY;  Service: Gastroenterology;  Laterality: N/A; 03/10/2020: ESOPHAGOGASTRODUODENOSCOPY (EGD) WITH PROPOFOL; N/A     Comment:  Procedure: ESOPHAGOGASTRODUODENOSCOPY (EGD) WITH               PROPOFOL;  Surgeon: [name], MD;  Location: [facility]               ENDOSCOPY;  Service: Gastroenterology;  Laterality: N/A; 03/31/2020: ESOPHAGOGASTRODUODENOSCOPY (EGD) WITH PROPOFOL; N/A     Comment:  Procedure: ESOPHAGOGASTRODUODENOSCOPY (EGD) WITH               PROPOFOL;  Surgeon: [name], MD;  Location: [facility]  ENDOSCOPY;  Service: Gastroenterology;  Laterality: N/A;               7:30 AM PROCEDURE PER DR ANNA C-19 TEST ON 03/30/2020 AM 04/22/2020: ESOPHAGOGASTRODUODENOSCOPY (EGD) WITH PROPOFOL; N/A     Comment:  Procedure: ESOPHAGOGASTRODUODENOSCOPY (EGD) WITH               PROPOFOL;  Surgeon: [name], MD;  Location: [facility]               ENDOSCOPY;  Service: Gastroenterology;  Laterality: N/A; No date: EYE SURGERY     Comment:  retinal detatchment No date: FRACTURE SURGERY No  date: radical prostate No date: TONSILLECTOMY   Reproductive/Obstetrics negative OB ROS                             Anesthesia Physical Anesthesia Plan  ASA: III  Anesthesia Plan: General   Post-op Pain Management:    Induction:   PONV Risk Score and Plan:   Airway Management Planned:   Additional Equipment:   Intra-op Plan:   Post-operative Plan:   Informed Consent: I have reviewed the patients History and Physical, chart, labs and discussed the procedure including the risks, benefits and alternatives for the proposed anesthesia with the patient or authorized representative who has indicated his/her understanding and acceptance.     Dental Advisory Given  Plan Discussed with: CRNA  Anesthesia Plan Comments:         Anesthesia Quick Evaluation

## 2020-05-27 ENCOUNTER — Encounter: Payer: Self-pay | Admitting: Gastroenterology

## 2020-05-28 NOTE — Anesthesia Postprocedure Evaluation (Signed)
Anesthesia Post Note  Patient: Andrew Mullins  Procedure(s) Performed: ESOPHAGOGASTRODUODENOSCOPY (EGD) WITH PROPOFOL (N/A )  Patient location during evaluation: PACU Anesthesia Type: General Level of consciousness: awake and alert Pain management: pain level controlled Vital Signs Assessment: post-procedure vital signs reviewed and stable Respiratory status: spontaneous breathing, nonlabored ventilation, respiratory function stable and patient connected to nasal cannula oxygen Cardiovascular status: blood pressure returned to baseline and stable Postop Assessment: no apparent nausea or vomiting Anesthetic complications: no   No complications documented.   Last Vitals:  Vitals:   05/26/20 1112 05/26/20 1122  BP: (!) 147/80 (!) 145/81  Pulse: (!) 52 (!) 52  Resp:  13  Temp:    SpO2: 99% 99%    Last Pain:  Vitals:   05/26/20 1122  TempSrc:   PainSc: 0-No pain                 Molli Barrows

## 2020-07-26 ENCOUNTER — Other Ambulatory Visit: Payer: Self-pay | Admitting: Internal Medicine

## 2020-07-26 DIAGNOSIS — M25572 Pain in left ankle and joints of left foot: Secondary | ICD-10-CM

## 2020-07-31 ENCOUNTER — Ambulatory Visit: Payer: BC Managed Care – PPO

## 2020-08-03 ENCOUNTER — Ambulatory Visit: Admission: RE | Admit: 2020-08-03 | Payer: BC Managed Care – PPO | Source: Ambulatory Visit

## 2020-08-11 ENCOUNTER — Other Ambulatory Visit: Payer: Self-pay | Admitting: Podiatry

## 2020-08-11 DIAGNOSIS — M7672 Peroneal tendinitis, left leg: Secondary | ICD-10-CM

## 2020-08-17 ENCOUNTER — Ambulatory Visit: Payer: BC Managed Care – PPO

## 2020-08-25 ENCOUNTER — Ambulatory Visit
Admission: RE | Admit: 2020-08-25 | Discharge: 2020-08-25 | Disposition: A | Discharge status: Home / Self Care | Payer: Medicare Other | Source: Ambulatory Visit | Attending: Podiatry | Admitting: Podiatry

## 2020-08-25 ENCOUNTER — Other Ambulatory Visit: Payer: Self-pay

## 2020-08-25 ENCOUNTER — Ambulatory Visit: Payer: BC Managed Care – PPO

## 2020-08-25 DIAGNOSIS — M7672 Peroneal tendinitis, left leg: Secondary | ICD-10-CM | POA: Insufficient documentation

## 2020-10-05 ENCOUNTER — Telehealth: Payer: Self-pay | Admitting: Gastroenterology

## 2020-10-05 NOTE — Telephone Encounter (Signed)
Ready to schedule procedure

## 2020-10-06 ENCOUNTER — Other Ambulatory Visit: Payer: Self-pay

## 2020-10-06 DIAGNOSIS — Z1211 Encounter for screening for malignant neoplasm of colon: Secondary | ICD-10-CM

## 2020-10-06 MED ORDER — NA SULFATE-K SULFATE-MG SULF 17.5-3.13-1.6 GM/177ML PO SOLN: 1.0000 | Freq: Once | ORAL | 0 refills | Status: AC

## 2020-10-06 NOTE — Telephone Encounter (Signed)
Procedure scheduled for 11/11/20.

## 2020-10-06 NOTE — Progress Notes (Signed)
Gastroenterology Pre-Procedure Review  Request Date: 11/11/20 Requesting Physician: Dr. Vicente Males  PATIENT REVIEW QUESTIONS: The patient responded to the following health history questions as indicated:    1. Are you having any GI issues? no 2. Do you have a personal history of Polyps? no 3. Do you have a family history of Colon Cancer or Polyps? no 4. Diabetes Mellitus? no 5. Joint replacements in the past 12 months?no 6. Major health problems in the past 3 months?no 7. Any artificial heart valves, MVP, or defibrillator?no    MEDICATIONS & ALLERGIES:    Patient reports the following regarding taking any anticoagulation/antiplatelet therapy:   Plavix, Coumadin, Eliquis, Xarelto, Lovenox, Pradaxa, Brilinta, or Effient? no Aspirin? yes (81 mg)  Patient confirms/reports the following medications:  Current Outpatient Medications  Medication Sig Dispense Refill   aspirin EC 325 MG EC tablet Take 1 tablet (325 mg total) by mouth daily. (Patient not taking: Reported on 04/22/2020) 30 tablet 0   atorvastatin (LIPITOR) 40 MG tablet Take 1 tablet (40 mg total) by mouth daily at 6 PM. 30 tablet 2   famciclovir (FAMVIR) 250 MG tablet Take 250 mg by mouth 2 (two) times daily.     gabapentin (NEURONTIN) 300 MG capsule Take 300 mg by mouth 3 (three) times daily.     losartan (COZAAR) 50 MG tablet Take 50 mg by mouth daily.     metoprolol tartrate (LOPRESSOR) 25 MG tablet Take 25 mg by mouth 2 (two) times daily.     omeprazole (PRILOSEC) 20 MG capsule Take 20 mg by mouth daily.     No current facility-administered medications for this visit.    Patient confirms/reports the following allergies:  No Known Allergies  No orders of the defined types were placed in this encounter.   AUTHORIZATION INFORMATION Primary Insurance: 1D#: Group #:  Secondary Insurance: 1D#: Group #:  SCHEDULE INFORMATION: Date: 11/11/20 Time: Location: [facility]

## 2020-11-10 ENCOUNTER — Encounter: Payer: Self-pay | Admitting: Gastroenterology

## 2020-11-11 ENCOUNTER — Ambulatory Visit: Payer: Medicare Other | Admitting: Anesthesiology

## 2020-11-11 ENCOUNTER — Encounter
Admission: RE | Disposition: A | Discharge status: Home / Self Care | Payer: Self-pay | Source: Home / Self Care | Attending: Gastroenterology

## 2020-11-11 ENCOUNTER — Encounter: Payer: Self-pay | Admitting: Gastroenterology

## 2020-11-11 ENCOUNTER — Ambulatory Visit
Admission: RE | Admit: 2020-11-11 | Discharge: 2020-11-11 | Disposition: A | Discharge status: Home / Self Care | Payer: Medicare Other | Attending: Gastroenterology | Admitting: Gastroenterology

## 2020-11-11 DIAGNOSIS — Z1211 Encounter for screening for malignant neoplasm of colon: Secondary | ICD-10-CM | POA: Diagnosis present

## 2020-11-11 DIAGNOSIS — Z79899 Other long term (current) drug therapy: Secondary | ICD-10-CM | POA: Diagnosis not present

## 2020-11-11 DIAGNOSIS — Z87891 Personal history of nicotine dependence: Secondary | ICD-10-CM | POA: Insufficient documentation

## 2020-11-11 DIAGNOSIS — K5730 Diverticulosis of large intestine without perforation or abscess without bleeding: Secondary | ICD-10-CM | POA: Insufficient documentation

## 2020-11-11 DIAGNOSIS — Z8546 Personal history of malignant neoplasm of prostate: Secondary | ICD-10-CM | POA: Diagnosis not present

## 2020-11-11 DIAGNOSIS — K635 Polyp of colon: Secondary | ICD-10-CM | POA: Insufficient documentation

## 2020-11-11 DIAGNOSIS — D126 Benign neoplasm of colon, unspecified: Secondary | ICD-10-CM

## 2020-11-11 DIAGNOSIS — Z8501 Personal history of malignant neoplasm of esophagus: Secondary | ICD-10-CM | POA: Insufficient documentation

## 2020-11-11 DIAGNOSIS — Z8673 Personal history of transient ischemic attack (TIA), and cerebral infarction without residual deficits: Secondary | ICD-10-CM | POA: Insufficient documentation

## 2020-11-11 DIAGNOSIS — Z7982 Long term (current) use of aspirin: Secondary | ICD-10-CM | POA: Diagnosis not present

## 2020-11-11 HISTORY — DX: Other complications of anesthesia, initial encounter: T88.59XA

## 2020-11-11 HISTORY — PX: COLONOSCOPY WITH PROPOFOL: SHX5780

## 2020-11-11 SURGERY — COLONOSCOPY WITH PROPOFOL
Anesthesia: General

## 2020-11-11 MED ORDER — PROPOFOL 500 MG/50ML IV EMUL
INTRAVENOUS | Status: DC | PRN
  Administered 2020-11-11: 50 ug/kg/min via INTRAVENOUS

## 2020-11-11 MED ORDER — FENTANYL CITRATE (PF) 100 MCG/2ML IJ SOLN
INTRAMUSCULAR | Status: AC
  Filled 2020-11-11: qty 2

## 2020-11-11 MED ORDER — LIDOCAINE HCL (CARDIAC) PF 100 MG/5ML IV SOSY
PREFILLED_SYRINGE | INTRAVENOUS | Status: DC | PRN
  Administered 2020-11-11: 50 mg via INTRAVENOUS

## 2020-11-11 MED ORDER — PROPOFOL 10 MG/ML IV BOLUS
INTRAVENOUS | Status: DC | PRN
  Administered 2020-11-11: 50 mg via INTRAVENOUS

## 2020-11-11 MED ORDER — SODIUM CHLORIDE 0.9 % IV SOLN
INTRAVENOUS | Status: DC
  Administered 2020-11-11: 1000 mL via INTRAVENOUS

## 2020-11-11 MED ORDER — PROPOFOL 500 MG/50ML IV EMUL
INTRAVENOUS | Status: AC
  Filled 2020-11-11: qty 50

## 2020-11-11 MED ORDER — MIDAZOLAM HCL 2 MG/2ML IJ SOLN
INTRAMUSCULAR | Status: DC | PRN
  Administered 2020-11-11: 2 mg via INTRAVENOUS

## 2020-11-11 MED ORDER — DEXMEDETOMIDINE HCL IN NACL 200 MCG/50ML IV SOLN
INTRAVENOUS | Status: DC | PRN
  Administered 2020-11-11: 8 ug via INTRAVENOUS
  Administered 2020-11-11: 4 ug via INTRAVENOUS
  Administered 2020-11-11: 8 ug via INTRAVENOUS

## 2020-11-11 MED ORDER — FENTANYL CITRATE (PF) 100 MCG/2ML IJ SOLN
INTRAMUSCULAR | Status: DC | PRN
  Administered 2020-11-11 (×2): 50 ug via INTRAVENOUS

## 2020-11-11 MED ORDER — MIDAZOLAM HCL 2 MG/2ML IJ SOLN
INTRAMUSCULAR | Status: AC
  Filled 2020-11-11: qty 2

## 2020-11-11 NOTE — Op Note (Signed)
[facility] Gastroenterology Patient Name: Andrew Mullins Procedure Date: 11/11/2020 9:35 AM MRN: [id number] Account #: [id number] Date of Birth: 02/26/1955 Admit Type: Outpatient Age: 65 Room: [facility] ENDO ROOM 4 Gender: Male Note Status: Finalized Instrument Name: Park Meo [id number] Procedure:             Colonoscopy Indications:           Screening for colorectal malignant neoplasm Providers:             [name] MD, MD Referring MD:          Baxter Hire, MD (Referring MD) Medicines:             Monitored Anesthesia Care Complications:         No immediate complications. Procedure:             Pre-Anesthesia Assessment:                        - Prior to the procedure, a History and Physical was                         performed, and patient medications, allergies and                         sensitivities were reviewed. The patient's tolerance                         of previous anesthesia was reviewed.                        - The risks and benefits of the procedure and the                         sedation options and risks were discussed with the                         patient. All questions were answered and informed                         consent was obtained.                        - ASA Grade Assessment: II - A patient with mild                         systemic disease.                        After obtaining informed consent, the colonoscope was                         passed under direct vision. Throughout the procedure,                         the patient's blood pressure, pulse, and oxygen                         saturations were monitored continuously. The                         Colonoscope was  introduced through the anus and                         advanced to the the cecum, identified by the                         appendiceal orifice. The colonoscopy was performed                         with ease. The patient tolerated the procedure  well.                         The quality of the bowel preparation was excellent. Findings:      The perianal and digital rectal examinations were normal.      A 3 mm polyp was found in the sigmoid colon. The polyp was sessile. The       polyp was removed with a cold biopsy forceps. Resection and retrieval       were complete.      Multiple small-mouthed diverticula were found in the sigmoid colon.      The exam was otherwise without abnormality on direct and retroflexion       views. Impression:            - One 3 mm polyp in the sigmoid colon, removed with a                         cold biopsy forceps. Resected and retrieved.                        - Diverticulosis in the sigmoid colon.                        - The examination was otherwise normal on direct and                         retroflexion views. Recommendation:        - Discharge patient to home (with escort).                        - Resume previous diet.                        - Continue present medications.                        - Await pathology results.                        - Repeat colonoscopy for surveillance based on                         pathology results. Procedure Code(s):     --- Professional ---                        [phone], Colonoscopy, flexible; with biopsy, single or                         multiple Diagnosis Code(s):     --- Professional ---  Z12.11, Encounter for screening for malignant neoplasm                         of colon                        K63.5, Polyp of colon                        K57.30, Diverticulosis of large intestine without                         perforation or abscess without bleeding CPT copyright 2019 American Medical Association. All rights reserved. The codes documented in this report are preliminary and upon coder review may  be revised to meet current compliance requirements. [name], MD [name] MD, MD 11/11/2020 10:07:35 AM This report has  been signed electronically. Number of Addenda: 0 Note Initiated On: 11/11/2020 9:35 AM Scope Withdrawal Time: 0 hours 13 minutes 10 seconds  Total Procedure Duration: 0 hours 18 minutes 44 seconds  Estimated Blood Loss:  Estimated blood loss: none.      [facility]

## 2020-11-11 NOTE — Transfer of Care (Signed)
Immediate Anesthesia Transfer of Care Note  Patient: Andrew Mullins  Procedure(s) Performed: COLONOSCOPY WITH PROPOFOL  Patient Location: PACU  Anesthesia Type:General  Level of Consciousness: sedated  Airway & Oxygen Therapy: Patient Spontanous Breathing and Patient connected to nasal cannula oxygen  Post-op Assessment: Report given to RN and Post -op Vital signs reviewed and stable  Post vital signs: Reviewed and stable  Last Vitals:  Vitals Value Taken Time  BP 86/52 11/11/20 1008  Temp 36.3 C 11/11/20 1008  Pulse 61 11/11/20 1010  Resp 14 11/11/20 1010  SpO2 97 % 11/11/20 1010  Vitals shown include unvalidated device data.  Last Pain:  Vitals:   11/11/20 1008  TempSrc: Temporal  PainSc: 0-No pain         Complications: No notable events documented.

## 2020-11-11 NOTE — Anesthesia Postprocedure Evaluation (Signed)
Anesthesia Post Note  Patient: Andrew Mullins  Procedure(s) Performed: COLONOSCOPY WITH PROPOFOL  Patient location during evaluation: PACU Anesthesia Type: General Level of consciousness: awake and alert, oriented and patient cooperative Pain management: pain level controlled Vital Signs Assessment: post-procedure vital signs reviewed and stable Respiratory status: spontaneous breathing, nonlabored ventilation and respiratory function stable Cardiovascular status: blood pressure returned to baseline and stable Postop Assessment: adequate PO intake Anesthetic complications: no   No notable events documented.   Last Vitals:  Vitals:   11/11/20 0856 11/11/20 1008  BP: (!) 162/89 (!) 86/52  Pulse: 64 (!) 53  Resp: 18 13  Temp: (!) 36.2 C (!) 36.3 C  SpO2: 100% 98%    Last Pain:  Vitals:   11/11/20 1038  TempSrc:   PainSc: 0-No pain                 Darrin Nipper

## 2020-11-11 NOTE — H&P (Signed)
[name], MD [address] [place], [place], [place], [address] [address], [place], [place], [place], [address] Phone: [phone]  Fax: [phone]  Primary Care Physician:  Baxter Hire, MD   Pre-Procedure History & Physical: HPI:  Andrew Mullins is a 65 y.o. male is here for an colonoscopy.   Past Medical History:  Diagnosis Date   Cancer [facility])    esophageal   Complication of anesthesia    GERD (gastroesophageal reflux disease)    Hypertension    Prostate cancer ([place])    Stroke ([place]) 2020   no wealness    Past Surgical History:  Procedure Laterality Date   broken bones repair     ESOPHAGOGASTRODUODENOSCOPY (EGD) WITH PROPOFOL N/A 02/18/2018   Procedure: ESOPHAGOGASTRODUODENOSCOPY (EGD) WITH PROPOFOL;  Surgeon: [name], MD;  Location: [facility] ENDOSCOPY;  Service: Gastroenterology;  Laterality: N/A;   ESOPHAGOGASTRODUODENOSCOPY (EGD) WITH PROPOFOL N/A 04/01/2018   Procedure: ESOPHAGOGASTRODUODENOSCOPY (EGD) WITH PROPOFOL;  Surgeon: [name], MD;  Location: [facility] ENDOSCOPY;  Service: Gastroenterology;  Laterality: N/A;   ESOPHAGOGASTRODUODENOSCOPY (EGD) WITH PROPOFOL N/A 12/02/2018   Procedure: ESOPHAGOGASTRODUODENOSCOPY (EGD) WITH PROPOFOL with Dilation;  Surgeon: [name], MD;  Location: [facility] ENDOSCOPY;  Service: Gastroenterology;  Laterality: N/A;   ESOPHAGOGASTRODUODENOSCOPY (EGD) WITH PROPOFOL N/A 05/19/2019   Procedure: ESOPHAGOGASTRODUODENOSCOPY (EGD) WITH PROPOFOL;  Surgeon: [name], MD;  Location: [facility] ENDOSCOPY;  Service: Gastroenterology;  Laterality: N/A;   ESOPHAGOGASTRODUODENOSCOPY (EGD) WITH PROPOFOL N/A 05/18/2019   Procedure: ESOPHAGOGASTRODUODENOSCOPY (EGD) WITH PROPOFOL with Dilation;  Surgeon: [name], MD;  Location: [facility] ENDOSCOPY;  Service: Gastroenterology;  Laterality: N/A;   ESOPHAGOGASTRODUODENOSCOPY (EGD) WITH PROPOFOL N/A 06/25/2019   Procedure: ESOPHAGOGASTRODUODENOSCOPY (EGD) WITH PROPOFOL  with Dilation;   Surgeon: [name], MD;  Location: [facility] ENDOSCOPY;  Service: Gastroenterology;  Laterality: N/A;   ESOPHAGOGASTRODUODENOSCOPY (EGD) WITH PROPOFOL N/A 07/09/2019   Procedure: ESOPHAGOGASTRODUODENOSCOPY (EGD) WITH PROPOFOL with Dilation;  Surgeon: [name], MD;  Location: [facility] ENDOSCOPY;  Service: Gastroenterology;  Laterality: N/A;  Pt requests early morning   ESOPHAGOGASTRODUODENOSCOPY (EGD) WITH PROPOFOL N/A 08/11/2019   Procedure: ESOPHAGOGASTRODUODENOSCOPY (EGD) WITH PROPOFOL with Dilation;  Surgeon: [name], MD;  Location: [facility] ENDOSCOPY;  Service: Gastroenterology;  Laterality: N/A;   ESOPHAGOGASTRODUODENOSCOPY (EGD) WITH PROPOFOL N/A 09/11/2019   Procedure: ESOPHAGOGASTRODUODENOSCOPY (EGD) WITH PROPOFOL with Dilation;  Surgeon: [name], MD;  Location: [facility] ENDOSCOPY;  Service: Gastroenterology;  Laterality: N/A;   ESOPHAGOGASTRODUODENOSCOPY (EGD) WITH PROPOFOL N/A 10/02/2019   Procedure: ESOPHAGOGASTRODUODENOSCOPY (EGD) WITH PROPOFOL with Dilation;  Surgeon: [name], MD;  Location: [facility] ENDOSCOPY;  Service: Gastroenterology;  Laterality: N/A;   ESOPHAGOGASTRODUODENOSCOPY (EGD) WITH PROPOFOL N/A 11/04/2019   Procedure: ESOPHAGOGASTRODUODENOSCOPY (EGD) WITH PROPOFOL;  Surgeon: [name], MD;  Location: [facility] ENDOSCOPY;  Service: Gastroenterology;  Laterality: N/A;   ESOPHAGOGASTRODUODENOSCOPY (EGD) WITH PROPOFOL N/A 02/05/2020   Procedure: ESOPHAGOGASTRODUODENOSCOPY (EGD) WITH PROPOFOL;  Surgeon: [name], MD;  Location: [facility] ENDOSCOPY;  Service: Gastroenterology;  Laterality: N/A;   ESOPHAGOGASTRODUODENOSCOPY (EGD) WITH PROPOFOL N/A 02/19/2020   Procedure: ESOPHAGOGASTRODUODENOSCOPY (EGD) WITH PROPOFOL;  Surgeon: [name], MD;  Location: [facility] ENDOSCOPY;  Service: Gastroenterology;  Laterality: N/A;   ESOPHAGOGASTRODUODENOSCOPY (EGD) WITH PROPOFOL N/A 03/10/2020   Procedure: ESOPHAGOGASTRODUODENOSCOPY (EGD) WITH PROPOFOL;  Surgeon: [name], MD;  Location: [facility]  ENDOSCOPY;  Service: Gastroenterology;  Laterality: N/A;   ESOPHAGOGASTRODUODENOSCOPY (EGD) WITH PROPOFOL N/A 03/31/2020   Procedure: ESOPHAGOGASTRODUODENOSCOPY (EGD) WITH PROPOFOL;  Surgeon: [name], MD;  Location: [facility] ENDOSCOPY;  Service: Gastroenterology;  Laterality: N/A;  7:30 AM PROCEDURE PER  DR Leeloo Silverthorne C-19 TEST ON 03/30/2020 AM   ESOPHAGOGASTRODUODENOSCOPY (EGD) WITH PROPOFOL N/A 04/22/2020   Procedure: ESOPHAGOGASTRODUODENOSCOPY (EGD) WITH PROPOFOL;  Surgeon: [name], MD;  Location: [facility] ENDOSCOPY;  Service: Gastroenterology;  Laterality: N/A;   ESOPHAGOGASTRODUODENOSCOPY (EGD) WITH PROPOFOL N/A 05/26/2020   Procedure: ESOPHAGOGASTRODUODENOSCOPY (EGD) WITH PROPOFOL;  Surgeon: [name], MD;  Location: [facility] ENDOSCOPY;  Service: Gastroenterology;  Laterality: N/A;   EYE SURGERY     retinal detatchment   radical prostate     TONSILLECTOMY      Prior to Admission medications   Medication Sig Start Date End Date Taking? Authorizing Provider  atorvastatin (LIPITOR) 40 MG tablet Take 1 tablet (40 mg total) by mouth daily at 6 PM. 09/18/18  Yes Fritzi Mandes, MD  famciclovir (FAMVIR) 250 MG tablet Take 250 mg by mouth 2 (two) times daily.   Yes [provider]  gabapentin (NEURONTIN) 300 MG capsule Take 300 mg by mouth 3 (three) times daily.   Yes [provider]  losartan (COZAAR) 50 MG tablet Take 50 mg by mouth daily. 08/20/18  Yes [provider]  metoprolol tartrate (LOPRESSOR) 25 MG tablet Take 25 mg by mouth 2 (two) times daily.   Yes [provider]  omeprazole (PRILOSEC) 20 MG capsule Take 20 mg by mouth daily.   Yes [provider]  aspirin EC 325 MG EC tablet Take 1 tablet (325 mg total) by mouth daily. Patient not taking: Reported on 04/22/2020 09/19/18   Fritzi Mandes, MD    Allergies as of 10/06/2020   (No Known Allergies)    History reviewed. No pertinent family history.  Social History   Socioeconomic History   Marital  status: Married    Spouse name: Not on file   Number of children: Not on file   Years of education: Not on file   Highest education level: Not on file  Occupational History   Not on file  Tobacco Use   Smoking status: Former   Smokeless tobacco: Never  Vaping Use   Vaping Use: Never used  Substance and Sexual Activity   Alcohol use: Not Currently   Drug use: Never   Sexual activity: Not on file  Other Topics Concern   Not on file  Social History Narrative   Lives at home with wife   Social Determinants of Health   Financial Resource Strain: Not on file  Food Insecurity: Not on file  Transportation Needs: Not on file  Physical Activity: Not on file  Stress: Not on file  Social Connections: Not on file  Intimate Partner Violence: Not on file    Review of Systems: See HPI, otherwise negative ROS  Physical Exam: BP (!) 162/89   Pulse 64   Temp (!) 97.1 F (36.2 C) (Temporal)   Resp 18   Ht 6' (1.829 m)   Wt 95.5 kg   SpO2 100%   BMI 28.54 kg/m  General:   Alert,  pleasant and cooperative in NAD Head:  Normocephalic and atraumatic. Neck:  Supple; no masses or thyromegaly. Lungs:  Clear throughout to auscultation, normal respiratory effort.    Heart:  +S1, +S2, Regular rate and rhythm, No edema. Abdomen:  Soft, nontender and nondistended. Normal bowel sounds, without guarding, and without rebound.   Neurologic:  Alert and  oriented x4;  grossly normal neurologically.  Impression/Plan: Andrew Mullins is here for an colonoscopy to be performed for Screening colonoscopy average risk   Risks, benefits, limitations, and alternatives regarding  colonoscopy have been reviewed with the patient.  Questions have been answered.  All parties agreeable.   [name], MD  11/11/2020, 9:12 AM

## 2020-11-11 NOTE — Anesthesia Preprocedure Evaluation (Signed)
Anesthesia Evaluation  Patient identified by MRN, date of birth, ID band Patient awake    Reviewed: Allergy & Precautions, NPO status , Patient's Chart, lab work & pertinent test results  History of Anesthesia Complications Negative for: history of anesthetic complications  Airway Mallampati: IV   Neck ROM: Full    Dental   Missing few molars:   Pulmonary former smoker (quit greater than 20 years ago),    Pulmonary exam normal breath sounds clear to auscultation       Cardiovascular hypertension, Normal cardiovascular exam Rhythm:Regular Rate:Normal     Neuro/Psych CVA (2020, no residual deficits)    GI/Hepatic GERD  ,Hx esophageal cancer   Endo/Other  negative endocrine ROS  Renal/GU negative Renal ROS   Hx prostate CA    Musculoskeletal   Abdominal   Peds  Hematology negative hematology ROS (+)   Anesthesia Other Findings   Reproductive/Obstetrics                             Anesthesia Physical Anesthesia Plan  ASA: 2  Anesthesia Plan: General   Post-op Pain Management:    Induction: Intravenous  PONV Risk Score and Plan: 2 and Propofol infusion, TIVA and Treatment may vary due to age or medical condition  Airway Management Planned: Natural Airway  Additional Equipment:   Intra-op Plan:   Post-operative Plan:   Informed Consent: I have reviewed the patients History and Physical, chart, labs and discussed the procedure including the risks, benefits and alternatives for the proposed anesthesia with the patient or authorized representative who has indicated his/her understanding and acceptance.       Plan Discussed with: CRNA  Anesthesia Plan Comments:         Anesthesia Quick Evaluation

## 2020-11-14 ENCOUNTER — Encounter: Payer: Self-pay | Admitting: Gastroenterology

## 2020-11-14 LAB — SURGICAL PATHOLOGY

## 2020-11-15 ENCOUNTER — Encounter: Payer: Self-pay | Admitting: Gastroenterology

## 2021-01-26 ENCOUNTER — Other Ambulatory Visit: Payer: Self-pay

## 2021-05-16 ENCOUNTER — Encounter: Payer: Self-pay | Admitting: Psychiatry

## 2021-06-27 ENCOUNTER — Emergency Department
Admission: EM | Admit: 2021-06-27 | Discharge: 2021-06-27 | Disposition: A | Discharge status: Home / Self Care | Payer: Medicare Other | Source: Home / Self Care | Attending: Emergency Medicine | Admitting: Emergency Medicine

## 2021-06-27 ENCOUNTER — Emergency Department: Payer: Medicare Other

## 2021-06-27 ENCOUNTER — Other Ambulatory Visit: Payer: Self-pay

## 2021-06-27 DIAGNOSIS — I10 Essential (primary) hypertension: Secondary | ICD-10-CM | POA: Insufficient documentation

## 2021-06-27 DIAGNOSIS — R0602 Shortness of breath: Secondary | ICD-10-CM

## 2021-06-27 DIAGNOSIS — I7090 Unspecified atherosclerosis: Secondary | ICD-10-CM | POA: Insufficient documentation

## 2021-06-27 DIAGNOSIS — Z8546 Personal history of malignant neoplasm of prostate: Secondary | ICD-10-CM | POA: Insufficient documentation

## 2021-06-27 DIAGNOSIS — K5790 Diverticulosis of intestine, part unspecified, without perforation or abscess without bleeding: Secondary | ICD-10-CM

## 2021-06-27 DIAGNOSIS — E876 Hypokalemia: Secondary | ICD-10-CM | POA: Insufficient documentation

## 2021-06-27 DIAGNOSIS — Z20822 Contact with and (suspected) exposure to covid-19: Secondary | ICD-10-CM | POA: Insufficient documentation

## 2021-06-27 DIAGNOSIS — I7121 Aneurysm of the ascending aorta, without rupture: Secondary | ICD-10-CM | POA: Insufficient documentation

## 2021-06-27 DIAGNOSIS — Z8501 Personal history of malignant neoplasm of esophagus: Secondary | ICD-10-CM | POA: Insufficient documentation

## 2021-06-27 DIAGNOSIS — M546 Pain in thoracic spine: Secondary | ICD-10-CM | POA: Insufficient documentation

## 2021-06-27 DIAGNOSIS — A409 Streptococcal sepsis, unspecified: Secondary | ICD-10-CM | POA: Diagnosis not present

## 2021-06-27 LAB — CBC WITH DIFFERENTIAL/PLATELET
Abs Immature Granulocytes: 0.07 10*3/uL (ref 0.00–0.07)
Basophils Absolute: 0 10*3/uL (ref 0.0–0.1)
Basophils Relative: 0 %
Eosinophils Absolute: 0 10*3/uL (ref 0.0–0.5)
Eosinophils Relative: 0 %
HCT: 35.9 % — ABNORMAL LOW (ref 39.0–52.0)
Hemoglobin: 12.2 g/dL — ABNORMAL LOW (ref 13.0–17.0)
Immature Granulocytes: 1 %
Lymphocytes Relative: 3 %
Lymphs Abs: 0.3 10*3/uL — ABNORMAL LOW (ref 0.7–4.0)
MCH: 27.8 pg (ref 26.0–34.0)
MCHC: 34 g/dL (ref 30.0–36.0)
MCV: 81.8 fL (ref 80.0–100.0)
Monocytes Absolute: 0.5 10*3/uL (ref 0.1–1.0)
Monocytes Relative: 6 %
Neutro Abs: 8.2 10*3/uL — ABNORMAL HIGH (ref 1.7–7.7)
Neutrophils Relative %: 90 %
Platelets: 141 10*3/uL — ABNORMAL LOW (ref 150–400)
RBC: 4.39 MIL/uL (ref 4.22–5.81)
RDW: 12.7 % (ref 11.5–15.5)
WBC: 9.1 10*3/uL (ref 4.0–10.5)
nRBC: 0 % (ref 0.0–0.2)

## 2021-06-27 LAB — COMPREHENSIVE METABOLIC PANEL
ALT: 13 U/L (ref 0–44)
AST: 16 U/L (ref 15–41)
Albumin: 3.4 g/dL — ABNORMAL LOW (ref 3.5–5.0)
Alkaline Phosphatase: 54 U/L (ref 38–126)
Anion gap: 9 (ref 5–15)
BUN: 13 mg/dL (ref 8–23)
CO2: 24 mmol/L (ref 22–32)
Calcium: 8.9 mg/dL (ref 8.9–10.3)
Chloride: 98 mmol/L (ref 98–111)
Creatinine, Ser: 0.6 mg/dL — ABNORMAL LOW (ref 0.61–1.24)
GFR, Estimated: >60 mL/min (ref >60)
Glucose, Bld: 136 mg/dL — ABNORMAL HIGH (ref 70–99)
Potassium: 3 mmol/L — ABNORMAL LOW (ref 3.5–5.1)
Sodium: 131 mmol/L — ABNORMAL LOW (ref 135–145)
Total Bilirubin: 1.1 mg/dL (ref 0.3–1.2)
Total Protein: 7.5 g/dL (ref 6.5–8.1)

## 2021-06-27 LAB — TROPONIN I (HIGH SENSITIVITY)
Troponin I (High Sensitivity): 15 ng/L (ref <18)
Troponin I (High Sensitivity): 16 ng/L (ref <18)

## 2021-06-27 LAB — LACTIC ACID, PLASMA: Lactic Acid, Venous: 1.1 mmol/L (ref 0.5–1.9)

## 2021-06-27 LAB — SARS CORONAVIRUS 2 BY RT PCR: SARS Coronavirus 2 by RT PCR: NEGATIVE

## 2021-06-27 LAB — MAGNESIUM: Magnesium: 1.5 mg/dL — ABNORMAL LOW (ref 1.7–2.4)

## 2021-06-27 MED ORDER — POTASSIUM CHLORIDE 10 MEQ/100ML IV SOLN
10.0000 meq | INTRAVENOUS | Status: DC
  Administered 2021-06-27 (×2): 10 meq via INTRAVENOUS
  Filled 2021-06-27 (×2): qty 100

## 2021-06-27 MED ORDER — FENTANYL CITRATE PF 50 MCG/ML IJ SOSY
50.0000 ug | PREFILLED_SYRINGE | Freq: Once | INTRAMUSCULAR | Status: AC
Start: 2021-06-27 — End: 2021-06-27
  Administered 2021-06-27: 50 ug via INTRAVENOUS
  Filled 2021-06-27: qty 1

## 2021-06-27 MED ORDER — ONDANSETRON HCL 4 MG/2ML IJ SOLN
4.0000 mg | Freq: Once | INTRAMUSCULAR | Status: AC
  Administered 2021-06-27: 4 mg via INTRAVENOUS
  Filled 2021-06-27: qty 2

## 2021-06-27 MED ORDER — ACETAMINOPHEN 500 MG PO TABS
1000.0000 mg | ORAL_TABLET | Freq: Once | ORAL | Status: AC
Start: 2021-06-27 — End: 2021-06-27
  Administered 2021-06-27: 1000 mg via ORAL
  Filled 2021-06-27: qty 2

## 2021-06-27 MED ORDER — CYCLOBENZAPRINE HCL 10 MG PO TABS
10.0000 mg | ORAL_TABLET | Freq: Once | ORAL | Status: AC
  Administered 2021-06-27: 10 mg via ORAL
  Filled 2021-06-27: qty 1

## 2021-06-27 MED ORDER — FENTANYL CITRATE PF 50 MCG/ML IJ SOSY
50.0000 ug | PREFILLED_SYRINGE | Freq: Once | INTRAMUSCULAR | Status: AC
  Administered 2021-06-27: 50 ug via INTRAVENOUS
  Filled 2021-06-27: qty 1

## 2021-06-27 MED ORDER — LIDOCAINE 5 % EX PTCH
1.0000 | MEDICATED_PATCH | CUTANEOUS | Status: DC
  Administered 2021-06-27: 1 via TRANSDERMAL
  Filled 2021-06-27: qty 1

## 2021-06-27 MED ORDER — MAGNESIUM SULFATE 2 GM/50ML IV SOLN
2.0000 g | Freq: Once | INTRAVENOUS | Status: AC
  Administered 2021-06-27: 2 g via INTRAVENOUS
  Filled 2021-06-27: qty 50

## 2021-06-27 MED ORDER — ALUM & MAG HYDROXIDE-SIMETH 200-200-20 MG/5ML PO SUSP
30.0000 mL | Freq: Once | ORAL | Status: AC
  Administered 2021-06-27: 30 mL via ORAL
  Filled 2021-06-27: qty 30

## 2021-06-27 MED ORDER — CYCLOBENZAPRINE HCL 10 MG PO TABS: 10.0000 mg | ORAL_TABLET | Freq: Three times a day (TID) | ORAL | 0 refills | Status: DC | PRN

## 2021-06-27 MED ORDER — POTASSIUM CHLORIDE CRYS ER 20 MEQ PO TBCR
40.0000 meq | EXTENDED_RELEASE_TABLET | Freq: Once | ORAL | Status: DC
  Filled 2021-06-27: qty 2

## 2021-06-27 MED ORDER — LACTATED RINGERS IV BOLUS
1000.0000 mL | Freq: Once | INTRAVENOUS | Status: AC
  Administered 2021-06-27: 1000 mL via INTRAVENOUS

## 2021-06-27 MED ORDER — IOHEXOL 350 MG/ML SOLN
100.0000 mL | Freq: Once | INTRAVENOUS | Status: AC | PRN
Start: 2021-06-27 — End: 2021-06-27
  Administered 2021-06-27: 100 mL via INTRAVENOUS

## 2021-06-27 MED ORDER — LIDOCAINE VISCOUS HCL 2 % MT SOLN
15.0000 mL | Freq: Once | OROMUCOSAL | Status: AC
  Administered 2021-06-27: 15 mL via OROMUCOSAL
  Filled 2021-06-27: qty 15

## 2021-06-27 MED ORDER — KETOROLAC TROMETHAMINE 30 MG/ML IJ SOLN
15.0000 mg | Freq: Once | INTRAMUSCULAR | Status: AC
  Administered 2021-06-27: 15 mg via INTRAVENOUS
  Filled 2021-06-27: qty 1

## 2021-06-27 NOTE — ED Triage Notes (Signed)
Pt here with a pinched nerve in his back. Pt also having SOB that started today. Pt denies CP but states shoulder pain. Pt ambulatory.

## 2021-06-27 NOTE — ED Provider Notes (Signed)
[facility] Provider Note    Event Date/Time   First MD Initiated Contact with Patient 06/27/21 1111     (approximate)   History   Shortness of Breath   HPI  Andrew Mullins is a 66 y.o. male with past medical history of CVA without residual weakness, prostate cancer, HTN, GERD and esophageal cancer for evaluation of shortness of breath that started today in the setting of 4 days of some left upper back pain.  Patient states he was seen by a chiropractor who advised to come to the emergency room told him that he likely had a pinched nerve in his back.  He states his back pain started the day after he was doing some work outside.  He endorses some nausea but denies any vomiting, diarrhea, abdominal pain, cough, fevers, headache, earache or sore throat.  No recent EtOH use illicit drug use or tobacco abuse.  He is not anticoagulated.    Past Medical History:  Diagnosis Date   Cancer ([place])    esophageal   Complication of anesthesia    GERD (gastroesophageal reflux disease)    Hypertension    Prostate cancer ([place])    Stroke ([place]) 2020   no wealness     Physical Exam  Triage Vital Signs: ED Triage Vitals  Enc Vitals Group     BP      Pulse      Resp      Temp      Temp src      SpO2      Weight      Height      Head Circumference      Peak Flow      Pain Score      Pain Loc      Pain Edu?      Excl. in [place]?     Most recent vital signs: Vitals:   06/27/21 1330 06/27/21 1341  BP: (!) 142/82   Pulse: 93   Resp: (!) 23 20  Temp:    SpO2: 94%     General: Awake, ill-appearing. CV:  Good peripheral perfusion.  Tachycardic but regular.  No significant murmur. Resp:  Normal effort.  Tachypneic but clear bilaterally. Abd:  No distention.  Soft. Other:  Some mild tenderness of the left scapula and surrounding muscles without any overlying skin changes or midline tenderness.   ED Results / Procedures / Treatments  Labs (all labs  ordered are listed, but only abnormal results are displayed) Labs Reviewed  CBC WITH DIFFERENTIAL/PLATELET - Abnormal; Notable for the following components:      Result Value   Hemoglobin 12.2 (*)    HCT 35.9 (*)    Platelets 141 (*)    Neutro Abs 8.2 (*)    Lymphs Abs 0.3 (*)    All other components within normal limits  COMPREHENSIVE METABOLIC PANEL - Abnormal; Notable for the following components:   Sodium 131 (*)    Potassium 3.0 (*)    Glucose, Bld 136 (*)    Creatinine, Ser 0.60 (*)    Albumin 3.4 (*)    All other components within normal limits  MAGNESIUM - Abnormal; Notable for the following components:   Magnesium 1.5 (*)    All other components within normal limits  SARS CORONAVIRUS 2 BY RT PCR  LACTIC ACID, PLASMA  TROPONIN I (HIGH SENSITIVITY)  TROPONIN I (HIGH SENSITIVITY)     EKG  EKG is remarkable for sinus  tachycardia with a ventricular rate of 112, normal axis, otherwise unremarkable intervals without clear evidence of acute ischemia or significant arrhythmia.   RADIOLOGY CTA chest my interpretation without evidence of a dissection, large PE, pneumonia, pneumothorax, rib fracture, large effusion or other clear acute or life-threatening process.  I reviewed radiology's interpretation and agree with their findings of an ascending aortic aneurysm without evidence of rupture as well as evidence of atherosclerosis and thickening of the thoracic esophagus possibly representing esophagitis as well as evidence of diverticulosis and lumbar impingement without other acute thoracic abdominal or pelvic process.   PROCEDURES:  Critical Care performed: No  .1-3 Lead EKG Interpretation  Performed by: Lucrezia Starch, MD Authorized by: Lucrezia Starch, MD     Interpretation: non-specific     ECG rate assessment: tachycardic     Rhythm: sinus tachycardia     Ectopy: none     Conduction: normal     The patient is on the cardiac monitor to evaluate for evidence of  arrhythmia and/or significant heart rate changes.   MEDICATIONS ORDERED IN ED: Medications  lidocaine (LIDODERM) 5 % 1 patch (1 patch Transdermal Patch Applied 06/27/21 1241)  potassium chloride 10 mEq in 100 mL IVPB (10 mEq Intravenous New Bag/Given 06/27/21 1434)  fentaNYL (SUBLIMAZE) injection 50 mcg (50 mcg Intravenous Given 06/27/21 1134)  ondansetron (ZOFRAN) injection 4 mg (4 mg Intravenous Given 06/27/21 1133)  iohexol (OMNIPAQUE) 350 MG/ML injection 100 mL (100 mLs Intravenous Contrast Given 06/27/21 1144)  lactated ringers bolus 1,000 mL (1,000 mLs Intravenous New Bag/Given 06/27/21 1214)  fentaNYL (SUBLIMAZE) injection 50 mcg (50 mcg Intravenous Given 06/27/21 1222)  alum & mag hydroxide-simeth (MAALOX/MYLANTA) 200-200-20 MG/5ML suspension 30 mL (30 mLs Oral Given 06/27/21 1302)  lidocaine (XYLOCAINE) 2 % viscous mouth solution 15 mL (15 mLs Mouth/Throat Given 06/27/21 1302)  magnesium sulfate IVPB 2 g 50 mL (2 g Intravenous New Bag/Given 06/27/21 1343)  ketorolac (TORADOL) 30 MG/ML injection 15 mg (15 mg Intravenous Given 06/27/21 1340)  acetaminophen (TYLENOL) tablet 1,000 mg (1,000 mg Oral Given 06/27/21 1338)  cyclobenzaprine (FLEXERIL) tablet 10 mg (10 mg Oral Given 06/27/21 1339)     IMPRESSION / MDM / ASSESSMENT AND PLAN / ED COURSE  I reviewed the triage vital signs and the nursing notes. Patient's presentation is most consistent with acute presentation with potential threat to life or bodily function.                               Differential diagnosis includes, but is not limited to PE, dissection, ACS, pneumonia, pneumothorax muscle spasm.  EKG is remarkable for sinus tachycardia with a ventricular rate of 112, normal axis, otherwise unremarkable intervals without clear evidence of acute ischemia or significant arrhythmia.  CTA chest my interpretation without evidence of a dissection, large PE, pneumonia, pneumothorax, rib fracture, large effusion or other clear acute or  life-threatening process.  I reviewed radiology's interpretation and agree with their findings of an ascending aortic aneurysm without evidence of rupture as well as evidence of atherosclerosis and thickening of the thoracic esophagus possibly representing esophagitis as well as evidence of diverticulosis and lumbar impingement without other acute thoracic abdominal or pelvic process.  CBC without leukocytosis and hemoglobin of 12.2 without recent to compare to.  Platelets are 141.  CMP is remarkable for K of 3 without any other significant electrolyte or metabolic derangements.  Plan x2 are nonelevated and low suspicion for  ACS at this time.  Lactic acid is not elevated.  COVID is negative.  Museum is 1.5.  Patient given some analgesia IV fluids muscle relaxant and lidocaine patch.  On reassessment he is feeling much better.  He is no longer short of breath.  Agrees required some supplemental oxygen shortly after receiving fentanyl on arrival but this was rapidly weaned off to room air and patient has not required any supplemental oxygen for over 1 hour prior to discharge.  At this point given he is feeling much better with otherwise very reassuring work-up and low suspicion for other immediate life-threatening process I think is stable for discharge continue outpatient evaluation.  Advised of incidental findings on CT including ascending aneurysm and recommendation to follow-up outpatient.  Rx written for Flexeril.  Discussed only taking this when he can sit or sleep and not using any heavy machinery or driving.  He is agreeable this plan.  No other acute concerns at this time.  Discharged in stable condition.  Strict return precautions advised and discussed.     FINAL CLINICAL IMPRESSION(S) / ED DIAGNOSES   Final diagnoses:  Aneurysm of ascending aorta without rupture (HCC)  Atherosclerosis  Hypokalemia  Hypomagnesemia  Acute left-sided thoracic back pain  SOB (shortness of breath)   Diverticulosis     Rx / DC Orders   ED Discharge Orders          Ordered    cyclobenzaprine (FLEXERIL) 10 MG tablet  3 times daily PRN        06/27/21 1526             Note:  This document was prepared using Dragon voice recognition software and may include unintentional dictation errors.   Lucrezia Starch, MD 06/27/21 [phone]

## 2021-06-27 NOTE — Discharge Instructions (Addendum)
Your CT today showed; IMPRESSION: 1. No acute vascular findings. 2. 4.3 cm in diameter ascending thoracic aortic aneurysm. Recommend annual imaging followup by CTA or MRA. This recommendation follows 2010 ACCF/AHA/AATS/ACR/ASA/SCA/SCAI/SIR/STS/SVM Guidelines for the Diagnosis and Management of Patients with Thoracic Aortic Disease. Circulation. 2010; 121: U235-T614. Aortic aneurysm NOS (ICD10-I71.9) 3. Generally mild degrees of atherosclerosis. Aortic Atherosclerosis (ICD10-I70.0). 4. Abnormal wall thickening and indistinct margins of the upper thoracic esophagus. The appearance is nonspecific, although esophagitis would be a potential cause. Correlate with any GI symptoms and patient history in determining whether follow up gastroenterology referral is warranted. 5. Other imaging findings of potential clinical significance: Duplicated infrarenal IVC. Sigmoid colon diverticulosis. 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 Multilevel lumbar impingement.

## 2021-06-28 ENCOUNTER — Inpatient Hospital Stay
Admission: EM | Admit: 2021-06-28 | Discharge: 2021-07-12 | DRG: 853 | Disposition: A | Discharge status: Home Health | Payer: Medicare Other | Attending: Internal Medicine | Admitting: Internal Medicine

## 2021-06-28 ENCOUNTER — Emergency Department: Payer: Medicare Other

## 2021-06-28 DIAGNOSIS — R1310 Dysphagia, unspecified: Secondary | ICD-10-CM | POA: Diagnosis present

## 2021-06-28 DIAGNOSIS — Z79899 Other long term (current) drug therapy: Secondary | ICD-10-CM

## 2021-06-28 DIAGNOSIS — Z9079 Acquired absence of other genital organ(s): Secondary | ICD-10-CM

## 2021-06-28 DIAGNOSIS — Z8501 Personal history of malignant neoplasm of esophagus: Secondary | ICD-10-CM

## 2021-06-28 DIAGNOSIS — K222 Esophageal obstruction: Secondary | ICD-10-CM | POA: Diagnosis present

## 2021-06-28 DIAGNOSIS — G062 Extradural and subdural abscess, unspecified: Secondary | ICD-10-CM | POA: Insufficient documentation

## 2021-06-28 DIAGNOSIS — Z87891 Personal history of nicotine dependence: Secondary | ICD-10-CM

## 2021-06-28 DIAGNOSIS — I10 Essential (primary) hypertension: Secondary | ICD-10-CM | POA: Diagnosis present

## 2021-06-28 DIAGNOSIS — E876 Hypokalemia: Secondary | ICD-10-CM | POA: Diagnosis present

## 2021-06-28 DIAGNOSIS — Z515 Encounter for palliative care: Secondary | ICD-10-CM

## 2021-06-28 DIAGNOSIS — D696 Thrombocytopenia, unspecified: Secondary | ICD-10-CM | POA: Diagnosis present

## 2021-06-28 DIAGNOSIS — M4624 Osteomyelitis of vertebra, thoracic region: Secondary | ICD-10-CM | POA: Diagnosis present

## 2021-06-28 DIAGNOSIS — Z923 Personal history of irradiation: Secondary | ICD-10-CM

## 2021-06-28 DIAGNOSIS — M549 Dorsalgia, unspecified: Secondary | ICD-10-CM | POA: Diagnosis present

## 2021-06-28 DIAGNOSIS — E871 Hypo-osmolality and hyponatremia: Secondary | ICD-10-CM | POA: Diagnosis present

## 2021-06-28 DIAGNOSIS — N390 Urinary tract infection, site not specified: Secondary | ICD-10-CM | POA: Diagnosis present

## 2021-06-28 DIAGNOSIS — Y92239 Unspecified place in hospital as the place of occurrence of the external cause: Secondary | ICD-10-CM | POA: Diagnosis not present

## 2021-06-28 DIAGNOSIS — N179 Acute kidney failure, unspecified: Secondary | ICD-10-CM | POA: Diagnosis not present

## 2021-06-28 DIAGNOSIS — T40605A Adverse effect of unspecified narcotics, initial encounter: Secondary | ICD-10-CM | POA: Diagnosis not present

## 2021-06-28 DIAGNOSIS — A409 Streptococcal sepsis, unspecified: Principal | ICD-10-CM | POA: Diagnosis present

## 2021-06-28 DIAGNOSIS — K5903 Drug induced constipation: Secondary | ICD-10-CM | POA: Diagnosis not present

## 2021-06-28 DIAGNOSIS — I639 Cerebral infarction, unspecified: Secondary | ICD-10-CM | POA: Diagnosis present

## 2021-06-28 DIAGNOSIS — I70208 Unspecified atherosclerosis of native arteries of extremities, other extremity: Secondary | ICD-10-CM | POA: Diagnosis present

## 2021-06-28 DIAGNOSIS — B9620 Unspecified Escherichia coli [E. coli] as the cause of diseases classified elsewhere: Secondary | ICD-10-CM | POA: Diagnosis present

## 2021-06-28 DIAGNOSIS — Z7982 Long term (current) use of aspirin: Secondary | ICD-10-CM

## 2021-06-28 DIAGNOSIS — M4644 Discitis, unspecified, thoracic region: Secondary | ICD-10-CM | POA: Diagnosis present

## 2021-06-28 DIAGNOSIS — M4803 Spinal stenosis, cervicothoracic region: Secondary | ICD-10-CM | POA: Diagnosis present

## 2021-06-28 DIAGNOSIS — D649 Anemia, unspecified: Secondary | ICD-10-CM | POA: Diagnosis present

## 2021-06-28 DIAGNOSIS — I69398 Other sequelae of cerebral infarction: Secondary | ICD-10-CM

## 2021-06-28 DIAGNOSIS — G8929 Other chronic pain: Secondary | ICD-10-CM | POA: Diagnosis present

## 2021-06-28 DIAGNOSIS — M4804 Spinal stenosis, thoracic region: Secondary | ICD-10-CM | POA: Diagnosis present

## 2021-06-28 DIAGNOSIS — I959 Hypotension, unspecified: Secondary | ICD-10-CM | POA: Diagnosis present

## 2021-06-28 DIAGNOSIS — A419 Sepsis, unspecified organism: Secondary | ICD-10-CM | POA: Diagnosis present

## 2021-06-28 DIAGNOSIS — Z8546 Personal history of malignant neoplasm of prostate: Secondary | ICD-10-CM

## 2021-06-28 DIAGNOSIS — Z9221 Personal history of antineoplastic chemotherapy: Secondary | ICD-10-CM

## 2021-06-28 DIAGNOSIS — Z20822 Contact with and (suspected) exposure to covid-19: Secondary | ICD-10-CM | POA: Diagnosis present

## 2021-06-28 DIAGNOSIS — K2100 Gastro-esophageal reflux disease with esophagitis, without bleeding: Secondary | ICD-10-CM | POA: Diagnosis present

## 2021-06-28 DIAGNOSIS — G061 Intraspinal abscess and granuloma: Secondary | ICD-10-CM | POA: Diagnosis present

## 2021-06-28 DIAGNOSIS — I7121 Aneurysm of the ascending aorta, without rupture: Secondary | ICD-10-CM | POA: Diagnosis present

## 2021-06-28 DIAGNOSIS — Z1611 Resistance to penicillins: Secondary | ICD-10-CM | POA: Diagnosis present

## 2021-06-28 DIAGNOSIS — M546 Pain in thoracic spine: Secondary | ICD-10-CM

## 2021-06-28 LAB — COMPREHENSIVE METABOLIC PANEL
ALT: 19 U/L (ref 0–44)
AST: 36 U/L (ref 15–41)
Albumin: 2.8 g/dL — ABNORMAL LOW (ref 3.5–5.0)
Alkaline Phosphatase: 57 U/L (ref 38–126)
Anion gap: 9 (ref 5–15)
BUN: 27 mg/dL — ABNORMAL HIGH (ref 8–23)
CO2: 23 mmol/L (ref 22–32)
Calcium: 8.2 mg/dL — ABNORMAL LOW (ref 8.9–10.3)
Chloride: 94 mmol/L — ABNORMAL LOW (ref 98–111)
Creatinine, Ser: 1 mg/dL (ref 0.61–1.24)
GFR, Estimated: >60 mL/min (ref >60)
Glucose, Bld: 101 mg/dL — ABNORMAL HIGH (ref 70–99)
Potassium: 3.5 mmol/L (ref 3.5–5.1)
Sodium: 126 mmol/L — ABNORMAL LOW (ref 135–145)
Total Bilirubin: 1 mg/dL (ref 0.3–1.2)
Total Protein: 6.3 g/dL — ABNORMAL LOW (ref 6.5–8.1)

## 2021-06-28 LAB — URINALYSIS, COMPLETE (UACMP) WITH MICROSCOPIC
Bilirubin Urine: NEGATIVE
Glucose, UA: NEGATIVE mg/dL
Ketones, ur: NEGATIVE mg/dL
Nitrite: POSITIVE — AB
Protein, ur: 100 mg/dL — AB
Specific Gravity, Urine: 1.02 (ref 1.005–1.030)
WBC, UA: >50 WBC/hpf — ABNORMAL HIGH (ref 0–5)
pH: 5 (ref 5.0–8.0)

## 2021-06-28 LAB — CBC
HCT: 31.4 % — ABNORMAL LOW (ref 39.0–52.0)
Hemoglobin: 10.7 g/dL — ABNORMAL LOW (ref 13.0–17.0)
MCH: 28 pg (ref 26.0–34.0)
MCHC: 34.1 g/dL (ref 30.0–36.0)
MCV: 82.2 fL (ref 80.0–100.0)
Platelets: 84 10*3/uL — ABNORMAL LOW (ref 150–400)
RBC: 3.82 MIL/uL — ABNORMAL LOW (ref 4.22–5.81)
RDW: 13 % (ref 11.5–15.5)
WBC: 5.6 10*3/uL (ref 4.0–10.5)
nRBC: 0 % (ref 0.0–0.2)

## 2021-06-28 LAB — LACTIC ACID, PLASMA
Lactic Acid, Venous: 2.1 mmol/L (ref 0.5–1.9)
Lactic Acid, Venous: 1.4 mmol/L (ref 0.5–1.9)

## 2021-06-28 MED ORDER — METRONIDAZOLE 500 MG/100ML IV SOLN
500.0000 mg | Freq: Once | INTRAVENOUS | Status: AC
  Administered 2021-06-28: 500 mg via INTRAVENOUS
  Filled 2021-06-28: qty 100

## 2021-06-28 MED ORDER — VANCOMYCIN HCL IN DEXTROSE 1-5 GM/200ML-% IV SOLN
1000.0000 mg | Freq: Once | INTRAVENOUS | Status: AC
  Administered 2021-06-28: 1000 mg via INTRAVENOUS
  Filled 2021-06-28: qty 200

## 2021-06-28 MED ORDER — LACTATED RINGERS IV BOLUS
1000.0000 mL | Freq: Once | INTRAVENOUS | Status: AC
  Administered 2021-06-28: 1000 mL via INTRAVENOUS

## 2021-06-28 MED ORDER — SODIUM CHLORIDE 0.9 % IV SOLN
2.0000 g | Freq: Once | INTRAVENOUS | Status: AC
  Administered 2021-06-29: 2 g via INTRAVENOUS
  Filled 2021-06-28 (×2): qty 12.5

## 2021-06-28 NOTE — ED Notes (Signed)
Sent another EDTA tube for this pt to confirm Plt count per lab request

## 2021-06-28 NOTE — ED Provider Notes (Signed)
[facility] Provider Note    Event Date/Time   First MD Initiated Contact with Patient 06/28/21 2209     (approximate)   History   Altered Mental Status and Fever   HPI  Andrew Mullins is a 66 y.o. male  who presents to the emergency department today because of concern for chills, confusion.  Symptoms started today.  Patient was seen in the emergency department yesterday because of concerns for upper back pain.  CT imaging at that time was negative and it was thought that it was musculoskeletal or pinched nerve.  Patient denies any shortness of breath or chest pain today.  Denies any nausea or vomiting.  Physical Exam   Triage Vital Signs: ED Triage Vitals  Enc Vitals Group     BP 06/28/21 2148 (!) 150/67     Pulse Rate 06/28/21 2148 (!) 120     Resp 06/28/21 2148 18     Temp 06/28/21 2148 (!) 102 F (38.9 C)     Temp Source 06/28/21 2148 Oral     SpO2 06/28/21 2148 93 %     Weight 06/28/21 2145 205 lb (93 kg)     Height 06/28/21 2145 6' (1.829 m)     Head Circumference --      Peak Flow --      Pain Score --      Pain Loc --      Pain Edu? --      Excl. in [place]? --     Most recent vital signs: Vitals:   06/28/21 2148 06/28/21 2150  BP: (!) 150/67 (!) 150/67  Pulse: (!) 120 (!) 120  Resp: 18 (!) 24  Temp: (!) 102 F (38.9 C)   SpO2: 93% 92%    General: Awake, alert, oriented. CV:  Good peripheral perfusion. Tachycardia, regular rhythm. Resp:  Normal effort. Lungs clear. Abd:  No distention. Non tender.   ED Results / Procedures / Treatments   Labs (all labs ordered are listed, but only abnormal results are displayed) Labs Reviewed  COMPREHENSIVE METABOLIC PANEL - Abnormal; Notable for the following components:      Result Value   Sodium 126 (*)    Chloride 94 (*)    Glucose, Bld 101 (*)    BUN 27 (*)    Calcium 8.2 (*)    Total Protein 6.3 (*)    Albumin 2.8 (*)    All other components within normal limits  CULTURE,  BLOOD (ROUTINE X 2)  CULTURE, BLOOD (ROUTINE X 2)  CBC  LACTIC ACID, PLASMA  URINALYSIS, COMPLETE (UACMP) WITH MICROSCOPIC     EKG  I, Nance Pear, attending physician, personally viewed and interpreted this EKG  EKG Time: 2215 Rate: 111 Rhythm: sinus tachycardia Axis: normal Intervals: qtc 475 QRS: narrow, q waves v1 ST changes: no st elevation Impression: abnormal ekg  RADIOLOGY I independently interpreted and visualized the CXR. My interpretation: No pneumonia, No pneumothorax. Radiology interpretation:  IMPRESSION:  No active disease.      PROCEDURES:  Critical Care performed: No  Procedures   MEDICATIONS ORDERED IN ED: Medications  vancomycin (VANCOCIN) IVPB 1000 mg/200 mL premix (has no administration in time range)  ceFEPIme (MAXIPIME) 2 g in sodium chloride 0.9 % 100 mL IVPB (has no administration in time range)  metroNIDAZOLE (FLAGYL) IVPB 500 mg (has no administration in time range)  lactated ringers bolus 1,000 mL (1,000 mLs Intravenous New Bag/Given 06/28/21 2216)     IMPRESSION /  MDM / ASSESSMENT AND PLAN / ED COURSE  I reviewed the triage vital signs and the nursing notes.                              Differential diagnosis includes, but is not limited to, UTI, pneumonia, bacteremia.   Patient's presentation is most consistent with acute presentation with potential threat to life or bodily function.  Patient presented to the emergency department today because of concerns for chills and some confusion.  Initial vital signs here were concerning for tachycardia and fever.  Given concerns for possible infection broad work-up was initiated.  Patient was started on IV fluids and antibiotic urine is concerning for possible urinary tract infection.  Discussed with Dr. Ara Kussmaul with the hospitalist service who will plan on admission.  FINAL CLINICAL IMPRESSION(S) / ED DIAGNOSES   Final diagnoses:  Lower urinary tract infectious disease       Note:  This document was prepared using Dragon voice recognition software and may include unintentional dictation errors.    Nance Pear, MD 06/30/21 [phone]

## 2021-06-28 NOTE — ED Notes (Signed)
ED Provider at bedside. 

## 2021-06-28 NOTE — ED Notes (Signed)
Lactic acid= 2.1 c/o lab

## 2021-06-28 NOTE — ED Triage Notes (Addendum)
Pt brought in to ED for c/o of increased confusion today per pt's wife. Per EMS pt febrile T=102,  pt seen in ED yesterday for back pain, labs and CT done.  Pt presents to ED AAOx4, respi even-unlabored.  In nad. Zofran IV, 539m fluid given en route. Per EMS pt's wife gave pt Morphine '15mg'$  PO PTA.  Pt reports started taking flexeril medication

## 2021-06-29 ENCOUNTER — Other Ambulatory Visit: Payer: Self-pay

## 2021-06-29 ENCOUNTER — Encounter: Payer: Self-pay | Admitting: Family Medicine

## 2021-06-29 ENCOUNTER — Inpatient Hospital Stay: Payer: Medicare Other

## 2021-06-29 DIAGNOSIS — N179 Acute kidney failure, unspecified: Secondary | ICD-10-CM | POA: Diagnosis not present

## 2021-06-29 DIAGNOSIS — D649 Anemia, unspecified: Secondary | ICD-10-CM | POA: Diagnosis present

## 2021-06-29 DIAGNOSIS — Z20822 Contact with and (suspected) exposure to covid-19: Secondary | ICD-10-CM | POA: Diagnosis present

## 2021-06-29 DIAGNOSIS — D696 Thrombocytopenia, unspecified: Secondary | ICD-10-CM | POA: Diagnosis not present

## 2021-06-29 DIAGNOSIS — E871 Hypo-osmolality and hyponatremia: Secondary | ICD-10-CM | POA: Diagnosis present

## 2021-06-29 DIAGNOSIS — M4620 Osteomyelitis of vertebra, site unspecified: Secondary | ICD-10-CM | POA: Diagnosis not present

## 2021-06-29 DIAGNOSIS — Z515 Encounter for palliative care: Secondary | ICD-10-CM | POA: Diagnosis not present

## 2021-06-29 DIAGNOSIS — Z8501 Personal history of malignant neoplasm of esophagus: Secondary | ICD-10-CM | POA: Diagnosis not present

## 2021-06-29 DIAGNOSIS — Z8546 Personal history of malignant neoplasm of prostate: Secondary | ICD-10-CM | POA: Diagnosis not present

## 2021-06-29 DIAGNOSIS — M4624 Osteomyelitis of vertebra, thoracic region: Secondary | ICD-10-CM | POA: Diagnosis present

## 2021-06-29 DIAGNOSIS — B955 Unspecified streptococcus as the cause of diseases classified elsewhere: Secondary | ICD-10-CM | POA: Diagnosis not present

## 2021-06-29 DIAGNOSIS — Y92239 Unspecified place in hospital as the place of occurrence of the external cause: Secondary | ICD-10-CM | POA: Diagnosis not present

## 2021-06-29 DIAGNOSIS — Z1611 Resistance to penicillins: Secondary | ICD-10-CM | POA: Diagnosis present

## 2021-06-29 DIAGNOSIS — I959 Hypotension, unspecified: Secondary | ICD-10-CM | POA: Diagnosis present

## 2021-06-29 DIAGNOSIS — I10 Essential (primary) hypertension: Secondary | ICD-10-CM | POA: Diagnosis present

## 2021-06-29 DIAGNOSIS — M546 Pain in thoracic spine: Secondary | ICD-10-CM | POA: Diagnosis not present

## 2021-06-29 DIAGNOSIS — B9620 Unspecified Escherichia coli [E. coli] as the cause of diseases classified elsewhere: Secondary | ICD-10-CM | POA: Diagnosis present

## 2021-06-29 DIAGNOSIS — A409 Streptococcal sepsis, unspecified: Secondary | ICD-10-CM | POA: Diagnosis present

## 2021-06-29 DIAGNOSIS — K2100 Gastro-esophageal reflux disease with esophagitis, without bleeding: Secondary | ICD-10-CM | POA: Diagnosis present

## 2021-06-29 DIAGNOSIS — M4804 Spinal stenosis, thoracic region: Secondary | ICD-10-CM | POA: Diagnosis present

## 2021-06-29 DIAGNOSIS — A419 Sepsis, unspecified organism: Secondary | ICD-10-CM | POA: Diagnosis not present

## 2021-06-29 DIAGNOSIS — E876 Hypokalemia: Secondary | ICD-10-CM | POA: Diagnosis present

## 2021-06-29 DIAGNOSIS — K222 Esophageal obstruction: Secondary | ICD-10-CM

## 2021-06-29 DIAGNOSIS — N390 Urinary tract infection, site not specified: Secondary | ICD-10-CM | POA: Diagnosis present

## 2021-06-29 DIAGNOSIS — I70208 Unspecified atherosclerosis of native arteries of extremities, other extremity: Secondary | ICD-10-CM | POA: Diagnosis present

## 2021-06-29 DIAGNOSIS — M549 Dorsalgia, unspecified: Secondary | ICD-10-CM | POA: Diagnosis present

## 2021-06-29 DIAGNOSIS — I7121 Aneurysm of the ascending aorta, without rupture: Secondary | ICD-10-CM | POA: Diagnosis present

## 2021-06-29 DIAGNOSIS — I69398 Other sequelae of cerebral infarction: Secondary | ICD-10-CM | POA: Diagnosis not present

## 2021-06-29 DIAGNOSIS — M4644 Discitis, unspecified, thoracic region: Secondary | ICD-10-CM | POA: Diagnosis not present

## 2021-06-29 DIAGNOSIS — R7881 Bacteremia: Secondary | ICD-10-CM | POA: Diagnosis not present

## 2021-06-29 DIAGNOSIS — G061 Intraspinal abscess and granuloma: Secondary | ICD-10-CM | POA: Diagnosis present

## 2021-06-29 DIAGNOSIS — M4803 Spinal stenosis, cervicothoracic region: Secondary | ICD-10-CM | POA: Diagnosis present

## 2021-06-29 LAB — CBC
HCT: 31 % — ABNORMAL LOW (ref 39.0–52.0)
Hemoglobin: 10.6 g/dL — ABNORMAL LOW (ref 13.0–17.0)
MCH: 28 pg (ref 26.0–34.0)
MCHC: 34.2 g/dL (ref 30.0–36.0)
MCV: 82 fL (ref 80.0–100.0)
Platelets: 79 10*3/uL — ABNORMAL LOW (ref 150–400)
RBC: 3.78 MIL/uL — ABNORMAL LOW (ref 4.22–5.81)
RDW: 13.1 % (ref 11.5–15.5)
WBC: 7 10*3/uL (ref 4.0–10.5)
nRBC: 0 % (ref 0.0–0.2)

## 2021-06-29 LAB — HIV ANTIBODY (ROUTINE TESTING W REFLEX): HIV Screen 4th Generation wRfx: NONREACTIVE

## 2021-06-29 LAB — BLOOD CULTURE ID PANEL (REFLEXED) - BCID2

## 2021-06-29 LAB — MAGNESIUM: Magnesium: 1.7 mg/dL (ref 1.7–2.4)

## 2021-06-29 LAB — BASIC METABOLIC PANEL
Anion gap: 6 (ref 5–15)
BUN: 21 mg/dL (ref 8–23)
CO2: 24 mmol/L (ref 22–32)
Calcium: 8.3 mg/dL — ABNORMAL LOW (ref 8.9–10.3)
Chloride: 99 mmol/L (ref 98–111)
Creatinine, Ser: 0.78 mg/dL (ref 0.61–1.24)
GFR, Estimated: >60 mL/min (ref >60)
Glucose, Bld: 155 mg/dL — ABNORMAL HIGH (ref 70–99)
Potassium: 3.2 mmol/L — ABNORMAL LOW (ref 3.5–5.1)
Sodium: 129 mmol/L — ABNORMAL LOW (ref 135–145)

## 2021-06-29 LAB — PROTIME-INR
INR: 1.3 — ABNORMAL HIGH (ref 0.8–1.2)
Prothrombin Time: 15.7 seconds — ABNORMAL HIGH (ref 11.4–15.2)

## 2021-06-29 LAB — PROCALCITONIN: Procalcitonin: 42.72 ng/mL

## 2021-06-29 LAB — APTT: aPTT: 44 seconds — ABNORMAL HIGH (ref 24–36)

## 2021-06-29 MED ORDER — POTASSIUM CHLORIDE CRYS ER 20 MEQ PO TBCR
40.0000 meq | EXTENDED_RELEASE_TABLET | Freq: Once | ORAL | Status: AC
Start: 2021-06-29 — End: 2021-06-29
  Administered 2021-06-29: 40 meq via ORAL
  Filled 2021-06-29: qty 2

## 2021-06-29 MED ORDER — HYDROMORPHONE HCL 1 MG/ML IJ SOLN
1.0000 mg | Freq: Once | INTRAMUSCULAR | Status: AC
  Administered 2021-06-29: 1 mg via INTRAVENOUS
  Filled 2021-06-29: qty 1

## 2021-06-29 MED ORDER — SENNOSIDES-DOCUSATE SODIUM 8.6-50 MG PO TABS: 1.0000 | ORAL_TABLET | Freq: Every evening | ORAL | Status: DC | PRN

## 2021-06-29 MED ORDER — MAGNESIUM SULFATE 2 GM/50ML IV SOLN
2.0000 g | Freq: Once | INTRAVENOUS | Status: AC
  Administered 2021-06-29: 2 g via INTRAVENOUS
  Filled 2021-06-29: qty 50

## 2021-06-29 MED ORDER — LOSARTAN POTASSIUM 50 MG PO TABS
50.0000 mg | ORAL_TABLET | Freq: Every day | ORAL | Status: DC
  Administered 2021-06-29 – 2021-07-07 (×8): 50 mg via ORAL
  Filled 2021-06-29 (×8): qty 1

## 2021-06-29 MED ORDER — FENTANYL CITRATE PF 50 MCG/ML IJ SOSY
50.0000 ug | PREFILLED_SYRINGE | INTRAMUSCULAR | Status: DC | PRN
  Administered 2021-06-29: 50 ug via INTRAVENOUS
  Filled 2021-06-29: qty 1

## 2021-06-29 MED ORDER — SODIUM CHLORIDE 0.9 % IV SOLN: INTRAVENOUS | Status: DC

## 2021-06-29 MED ORDER — ONDANSETRON HCL 4 MG PO TABS
4.0000 mg | ORAL_TABLET | Freq: Four times a day (QID) | ORAL | Status: DC | PRN
  Administered 2021-07-02: 4 mg via ORAL
  Filled 2021-06-29: qty 1

## 2021-06-29 MED ORDER — ACETAMINOPHEN 325 MG PO TABS
650.0000 mg | ORAL_TABLET | Freq: Four times a day (QID) | ORAL | Status: DC | PRN
  Administered 2021-07-01 – 2021-07-04 (×4): 650 mg via ORAL
  Filled 2021-06-29 (×4): qty 2

## 2021-06-29 MED ORDER — ACETAMINOPHEN 650 MG RE SUPP: 650.0000 mg | Freq: Four times a day (QID) | RECTAL | Status: DC | PRN

## 2021-06-29 MED ORDER — ATORVASTATIN CALCIUM 20 MG PO TABS
40.0000 mg | ORAL_TABLET | Freq: Every day | ORAL | Status: DC
  Administered 2021-06-29 – 2021-07-11 (×13): 40 mg via ORAL
  Filled 2021-06-29 (×13): qty 2

## 2021-06-29 MED ORDER — SODIUM CHLORIDE 0.9 % IV SOLN
2.0000 g | INTRAVENOUS | Status: DC
  Administered 2021-06-29 – 2021-06-30 (×2): 2 g via INTRAVENOUS
  Filled 2021-06-29: qty 2
  Filled 2021-06-29: qty 20

## 2021-06-29 MED ORDER — LIDOCAINE 5 % EX PTCH
1.0000 | MEDICATED_PATCH | CUTANEOUS | Status: DC
Start: 2021-06-29 — End: 2021-07-04
  Administered 2021-07-01: 1 via TRANSDERMAL
  Filled 2021-06-29 (×6): qty 1

## 2021-06-29 MED ORDER — IPRATROPIUM BROMIDE 0.02 % IN SOLN: 0.5000 mg | Freq: Four times a day (QID) | RESPIRATORY_TRACT | Status: DC | PRN

## 2021-06-29 MED ORDER — ALBUTEROL SULFATE (2.5 MG/3ML) 0.083% IN NEBU: 2.5000 mg | INHALATION_SOLUTION | Freq: Four times a day (QID) | RESPIRATORY_TRACT | Status: DC | PRN

## 2021-06-29 MED ORDER — GABAPENTIN 300 MG PO CAPS
300.0000 mg | ORAL_CAPSULE | Freq: Three times a day (TID) | ORAL | Status: DC
  Administered 2021-06-29 – 2021-07-12 (×39): 300 mg via ORAL
  Filled 2021-06-29 (×40): qty 1

## 2021-06-29 MED ORDER — METOPROLOL TARTRATE 25 MG PO TABS
25.0000 mg | ORAL_TABLET | Freq: Two times a day (BID) | ORAL | Status: DC
  Administered 2021-06-29 – 2021-07-12 (×26): 25 mg via ORAL
  Filled 2021-06-29 (×26): qty 1

## 2021-06-29 MED ORDER — BISACODYL 5 MG PO TBEC
5.0000 mg | DELAYED_RELEASE_TABLET | Freq: Every day | ORAL | Status: DC | PRN
  Administered 2021-07-10: 5 mg via ORAL
  Filled 2021-06-29: qty 1

## 2021-06-29 MED ORDER — KETOROLAC TROMETHAMINE 30 MG/ML IJ SOLN
30.0000 mg | Freq: Once | INTRAMUSCULAR | Status: DC
Start: 2021-06-29 — End: 2021-07-01
  Filled 2021-06-29: qty 1

## 2021-06-29 MED ORDER — MORPHINE SULFATE (PF) 4 MG/ML IV SOLN: 4.0000 mg | INTRAVENOUS | Status: DC | PRN

## 2021-06-29 MED ORDER — LORAZEPAM 2 MG PO TABS
2.0000 mg | ORAL_TABLET | Freq: Once | ORAL | Status: AC
  Administered 2021-06-29: 2 mg via ORAL
  Filled 2021-06-29: qty 1

## 2021-06-29 MED ORDER — PANTOPRAZOLE SODIUM 40 MG PO TBEC
40.0000 mg | DELAYED_RELEASE_TABLET | Freq: Every day | ORAL | Status: DC
  Administered 2021-06-29 – 2021-07-12 (×13): 40 mg via ORAL
  Filled 2021-06-29 (×13): qty 1

## 2021-06-29 MED ORDER — ONDANSETRON HCL 4 MG/2ML IJ SOLN: 4.0000 mg | Freq: Four times a day (QID) | INTRAMUSCULAR | Status: DC | PRN

## 2021-06-29 MED ORDER — MORPHINE SULFATE (PF) 2 MG/ML IV SOLN: 1.0000 mg | Freq: Four times a day (QID) | INTRAVENOUS | Status: DC | PRN

## 2021-06-29 MED ORDER — TRAZODONE HCL 50 MG PO TABS
25.0000 mg | ORAL_TABLET | Freq: Every evening | ORAL | Status: DC | PRN
  Administered 2021-06-29 – 2021-07-11 (×3): 25 mg via ORAL
  Filled 2021-06-29 (×4): qty 1

## 2021-06-29 MED ORDER — MORPHINE SULFATE (PF) 2 MG/ML IV SOLN
2.0000 mg | INTRAVENOUS | Status: DC | PRN
  Administered 2021-06-29 (×3): 2 mg via INTRAVENOUS
  Filled 2021-06-29 (×3): qty 1

## 2021-06-29 MED ORDER — ENOXAPARIN SODIUM 40 MG/0.4ML IJ SOSY: 40.0000 mg | PREFILLED_SYRINGE | INTRAMUSCULAR | Status: DC

## 2021-06-29 MED ORDER — ADULT MULTIVITAMIN W/MINERALS CH
1.0000 | ORAL_TABLET | Freq: Every morning | ORAL | Status: DC
  Administered 2021-06-30 – 2021-07-12 (×12): 1 via ORAL
  Filled 2021-06-29 (×12): qty 1

## 2021-06-29 MED ORDER — HYDROMORPHONE HCL 1 MG/ML IJ SOLN
1.0000 mg | INTRAMUSCULAR | Status: DC | PRN
  Administered 2021-06-29 – 2021-07-07 (×18): 1 mg via INTRAVENOUS
  Filled 2021-06-29 (×19): qty 1

## 2021-06-29 NOTE — Assessment & Plan Note (Signed)
Patient has an history of CVA.  No acute concern. -Continue home dose of Lipitor -Holding home aspirin due to thrombocytopenia

## 2021-06-29 NOTE — Hospital Course (Addendum)
Taken from H&P.  Andrew Mullins is a 66 y.o. male with a known history of CVA with residual weakness, prostate CA, HTN, GERD< esophageal cancer  presents to the emergency department for evaluation of shaking chills and fever.  He was seen in our ER two days ago for symptoms of a pinched nerve in his back that started after doing work in the yard.  He was given muscle relaxant, lidocaine patch and discharged home.  He returns today with complaint of confusion and weakness. Has  been taking morphine 71m PO daily for help with back pain.    Patient denies  weakness, dizziness, chest pain, shortness of breath, N/V/C/D, abdominal pain, dysuria/frequency, changes in mental status.   ED course.  On arrival to ED he was febrile at 102, met sepsis criteria with fever, tachycardia and tachypnea.  Borderline soft blood pressure which improved with IV fluid.  Patient received broad-spectrum antibiotics per sepsis protocol in ED which include cefepime, Flagyl and vancomycin.  UA was positive for nitrites and leukocytes.  He was continued on ceftriaxone and admitted for sepsis secondary to UTI.  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  Preliminary blood cultures with Streptococcus species in anaerobic bottles.  Urine cultures were ordered as an add-on. Patient continued to experience significant back pain. Multilevel lumbar impingement was noted on CTA of chest, abdomen and pelvis done on 06/27/2021. Patient is now complaining of more pain between the shoulder blade, radiating to the both shoulders, no tingling and numbness or focal weakness. Ordered MRI thoracic and lumbar spine. Neurosurgery was consulted.  Off note: [facility].  CTA chest with some concern of esophagitis.  Might have bacteremia secondary to esophageal dilatation. ID was also consulted.  6/16: All blood cultures growing Streptococcus, most likely strep viridans  secondary to recent esophageal dilatation.  Preliminary urine cultures with E. Coli.  ID is on board.  MRI of thoracic spine with concern of discitis and abscess starting from T1, recommending cervical MRI which was ordered but will not be able to obtain it until 24-hour before the prior images.  Procalcitonin markedly elevated.  Echocardiogram ordered. Neurosurgery to determine whether they can drain that epidural abscess, recommending conservative management at this time. Echocardiogram ordered-Will avoid TEE due to his history of esophageal disease. Repeat blood cultures on Sunday-if remain negative for 48 hours then PICC line will be placed Continue to have significant upper back pain. GI was also consulted to rule out perforation although prior imaging was negative, Dr. AVicente Maleswill see the patient by Monday.  Patient does not want to see any other gastroenterologist.  6/17: Patient developed another febrile episode at 102 this morning, procalcitonin improving at 15.81 today.  Rest of the labs seems stable with mild hyponatremia and hypokalemia.  Repeat urine cultures with E. coli, only resistant to ampicillin and sulbactam.  Blood cultures with Streptococcus anginosus-pending susceptibility. MRI cervical spine done this morning with osteomyelitis discitis involving T1-T2, extension of phlegmon/abscess up to C4-C5 level.  Associated moderate spinal stenosis. Continues to have 10 out of 10 pain which interferes with all the activities and sleep.  Added MS Contin does not seem working at this time.  He was using all of his Dilaudid. Talked with Dr. YCari Carawayand they might take him to the OR for washout tomorrow if he remained febrile and continued to have pain.  Patient was made n.p.o. after midnight .  6/18: Patient afebrile  this morning, had 2 episodes of fever with maximum of 102.4 yesterday. Ordered repeat blood culture.  Prior blood cultures positive for Streptococcus anginosus with good  sensitivity.  Going to the OR with neurosurgery later today for washout. Echocardiogram with normal EF and grade 1 diastolic dysfunction and no mention of any valvular disease.  6/19: Pain seems improving after the procedure Patient underwent T1-2 Hemilaminectomy for washout of epidural abscess with neurosurgery on 07/02/2021.  Drain was placed and will remain in and is being managed by neurosurgery.  Preliminary wound cultures with no organisms yet.  Remained afebrile over the past 24 hours.  Repeat blood cultures done on 6/18 are still pending. Patient with complicated history of recurrent esophageal strictures requiring multiple dilatations, per patient he has been dilated 82 times.  GI was also consulted, per GI if he developed dysphagia again which is anticipated in the next couple of weeks, will need to follow-up with [facility] and might need prophylactic antibiotics.  6/20: Patient remained stable.  Blood pressure elevated-IV labetalol as needed added.  Noted to have allergies to hydralazine.  Intraoperative cultures remain negative in 24 hours, repeat blood cultures from 6/18 remain negative.  Wound drain was removed by neurosurgery.  Worsening of pain today, involved palliative care for pain management.

## 2021-06-29 NOTE — ED Notes (Signed)
ED TO INPATIENT HANDOFF REPORT  ED Nurse Name and Phone #: Baxter Flattery, RN  S Name/Age/Gender Lelan Pons 66 y.o. male Room/Bed: ED30A/ED30A  Code Status   Code Status: Full Code  Home/SNF/Other Home Patient oriented to: self, place, time, and situation Is this baseline? Yes   Triage Complete: Triage complete  Chief Complaint Sepsis secondary to UTI (Ray) [A41.9, N39.0]  Triage Note Pt brought in to ED for c/o of increased confusion today per pt's wife. Per EMS pt febrile T=102,  pt seen in ED yesterday for back pain, labs and CT done.  Pt presents to ED AAOx4, respi even-unlabored.  In nad. Zofran IV, 538m fluid given en route. Per EMS pt's wife gave pt Morphine '15mg'$  PO PTA.  Pt reports started taking flexeril medication   Allergies Allergies  Allergen Reactions   Hydralazine Other (See Comments)    Flushing, anxiety-per [facility] [place], anxiety, shaking after procedure.    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   Complication of anesthesia    GERD (gastroesophageal reflux disease)    Hypertension    Prostate cancer ([place]    Stroke ([place] 2020   no wealness   Past Surgical History:  Procedure Laterality Date   broken bones repair     COLONOSCOPY WITH PROPOFOL N/A 11/11/2020   Procedure: COLONOSCOPY WITH PROPOFOL;  Surgeon: AJonathon Bellows MD;  [facility];  Service: Gastroenterology;  Laterality: N/A;   ESOPHAGOGASTRODUODENOSCOPY (EGD) WITH PROPOFOL N/A 02/18/2018   Procedure: ESOPHAGOGASTRODUODENOSCOPY (EGD) WITH PROPOFOL;  Surgeon: AJonathon Bellows MD;  [facility]  Service: Gastroenterology;  Laterality: N/A;   ESOPHAGOGASTRODUODENOSCOPY (EGD) WITH PROPOFOL N/A 04/01/2018   Procedure: ESOPHAGOGASTRODUODENOSCOPY (EGD) WITH PROPOFOL;  Surgeon: AJonathon Bellows MD;  [facility]  Service: Gastroenterology;  Laterality: N/A;   ESOPHAGOGASTRODUODENOSCOPY (EGD) WITH PROPOFOL N/A 12/02/2018   Procedure: ESOPHAGOGASTRODUODENOSCOPY (EGD) WITH PROPOFOL with Dilation;  Surgeon: AJonathon Bellows MD;  [facility]  Service: Gastroenterology;  Laterality: N/A;   ESOPHAGOGASTRODUODENOSCOPY (EGD) WITH PROPOFOL N/A 05/19/2019   Procedure: ESOPHAGOGASTRODUODENOSCOPY (EGD) WITH PROPOFOL;  Surgeon: AJonathon Bellows MD;  [facility]  Service: Gastroenterology;  Laterality: N/A;   ESOPHAGOGASTRODUODENOSCOPY (EGD) WITH PROPOFOL N/A 05/18/2019   Procedure: ESOPHAGOGASTRODUODENOSCOPY (EGD) WITH PROPOFOL with Dilation;  Surgeon: AJonathon Bellows MD;  [facility]  Service: Gastroenterology;  Laterality: N/A;   ESOPHAGOGASTRODUODENOSCOPY (EGD) WITH PROPOFOL N/A 06/25/2019   Procedure: ESOPHAGOGASTRODUODENOSCOPY (EGD) WITH PROPOFOL  with Dilation;  Surgeon: AJonathon Bellows MD;  [facility]  Service: Gastroenterology;  Laterality: N/A;   ESOPHAGOGASTRODUODENOSCOPY (EGD) WITH PROPOFOL N/A 07/09/2019   Procedure: ESOPHAGOGASTRODUODENOSCOPY (EGD) WITH PROPOFOL with Dilation;  Surgeon: AJonathon Bellows MD;  [facility]  Service: Gastroenterology;  Laterality: N/A;  Pt requests early morning   ESOPHAGOGASTRODUODENOSCOPY (EGD) WITH PROPOFOL N/A 08/11/2019   Procedure: ESOPHAGOGASTRODUODENOSCOPY (EGD) WITH PROPOFOL with Dilation;  Surgeon: AJonathon Bellows MD;  [facility]  Service:  Gastroenterology;  Laterality: N/A;   ESOPHAGOGASTRODUODENOSCOPY (EGD) WITH PROPOFOL N/A 09/11/2019   Procedure: ESOPHAGOGASTRODUODENOSCOPY (EGD) WITH PROPOFOL with Dilation;  Surgeon: [name], MD;  [facility] ENDOSCOPY;  Service: Gastroenterology;  Laterality: N/A;   ESOPHAGOGASTRODUODENOSCOPY (EGD) WITH PROPOFOL N/A 10/02/2019   Procedure: ESOPHAGOGASTRODUODENOSCOPY (EGD) WITH PROPOFOL with Dilation;  Surgeon: [name], MD;  Location: [facility] END[facility]COPY;  Service: Gastroenterology;  Laterality: N/A;   ESOPHAGOGASTRODUODENOSCOPY (EGD) WITH PROPOFOL N/A 11/04/2019   Procedure: ESOPHAGOGASTRODUODENOSCOPY (EGD) WITH PROPOFOL;  Surgeon: [name], MD;  Location: [facility]COPY;  Service: Gastroenterology;  Laterality: N/A;   ESOPHAGOGASTRODUODENOSCOPY (EGD) WITH PROPOFOL N/A 02/05/2020   Procedure: ESOPHAGOGASTRODUODENOSCOPY (EGD) WITH PROPOFOL;  Surgeon: [name], MD;  Location: [facility] ENDOSCOPY;  Service: Gastroenterology;  Laterality: N/A;   ESOPHAGOGASTRODUODENOSCOPY (EGD) WITH PROPOFOL N/A 02/19/2020   Procedure: ESOPHAGOGASTRODUODENOSCOPY (EGD) WITH PROPOFOL;  Surgeon: [name], MD;  [facility]COPY;  Service: Gastroenterology;  Laterality: N/A;   ESOPHAGOGASTRODUODENOSCOPY (EGD) WITH PROPOFOL N/A 03/10/2020   Procedure: ESOPHAGOGASTRODUODENOSCOPY (EGD) WITH PROPOFOL;  Surgeon: [name], MD;  [facility];  Service: Gastroenterology;  Laterality: N/A;   ESOPHAGOGASTRODUODENOSCOPY (EGD) WITH PROPOFOL N/A 03/31/2020   Procedure: ESOPHAGOGASTRODUODENOSCOPY (EGD) WITH PROPOFOL;  Surgeon: [name], MD;  Location: [facility]COPY;  Service: Gastroenterology;  Laterality: N/A;  7:30 AM PROCEDURE PER DR ANNA C-19 TEST ON 03/30/2020 AM   ESOPHAGOGASTRODUODENOSCOPY (EGD) WITH PROPOFOL N/A 04/22/2020   Procedure: ESOPHAGOGASTRODUODENOSCOPY (EGD) WITH PROPOFOL;  Surgeon: [name], MD;  Location: [facility] ENDOSCOPY;  Service: Gastroenterology;  Laterality: N/A;    ESOPHAGOGASTRODUODENOSCOPY (EGD) WITH PROPOFOL N/A 05/26/2020   Procedure: ESOPHAGOGASTRODUODENOSCOPY (EGD) WITH PROPOFOL;  Surgeon: [name], MD;  Location:  [facility] ENDOSCOPY;  Service: Gastroenterology;  Laterality: N/A;   EYE SURGERY     retinal detatchment   radical prostate     TONSILLECTOMY       A IV Location/Drains/Wounds Patient Lines/Drains/Airways Status     Active Line/Drains/Airways     Name Placement date Placement time Site Days   Peripheral IV 18 G Right Antecubital --  --  Antecubital  --            Intake/Output Last 24 hours  Intake/Output Summary (Last 24 hours) at 06/29/2021 1042 Last data filed at 06/29/2021 0320 Gross per 24 hour  Intake 1500 ml  Output --  Net 1500 ml    Labs/Imaging Results for orders placed or performed during the hospital encounter of 06/28/21 (from the past 48 hour(s))  Comprehensive metabolic panel     Status: Abnormal   Collection Time: 06/28/21  9:51 PM  Result Value Ref Range   Sodium 126 (L) 135 - 145 mmol/L   Potassium 3.5 3.5 - 5.1 mmol/L   Chloride 94 (L) 98 - 111 mmol/L   CO2 23 22 - 32 mmol/L   Glucose, Bld 101 (H) 70 - 99 mg/dL    Comment: Glucose reference range applies only to samples taken after fasting for at least 8 hours.   BUN 27 (H) 8 - 23 mg/dL   Creatinine, Ser 1.00 0.61 - 1.24 mg/dL   Calcium 8.2 (L) 8.9 - 10.3 mg/dL   Total Protein 6.3 (L) 6.5 - 8.1 g/dL   Albumin 2.8 (L) 3.5 - 5.0 g/dL   AST 36 15 - 41 U/L   ALT 19 0 - 44 U/L   Alkaline Phosphatase 57 38 - 126 U/L   Total Bilirubin 1.0 0.3 - 1.2 mg/dL   GFR, Estimated >60 >60 mL/min    Comment: (NOTE)  Calculated using the CKD-EPI Creatinine Equation (2021)    Anion gap 9 5 - 15    Comment: Performed at [facility], [address] [place], [place] [address]  CBC     Status: Abnormal   Collection Time: 06/28/21  9:51 PM  Result Value Ref Range   WBC 5.6 4.0 - 10.5 K/uL   RBC 3.82 (L) 4.22 - 5.81 MIL/uL   Hemoglobin 10.7 (L)  13.0 - 17.0 g/dL   HCT 31.4 (L) 39.0 - 52.0 %   MCV 82.2 80.0 - 100.0 fL   MCH 28.0 26.0 - 34.0 pg   MCHC 34.1 30.0 - 36.0 g/dL   RDW 13.0 11.5 - 15.5 %   Platelets 84 (L) 150 - 400 K/uL    Comment: Immature Platelet Fraction may be clinically indicated, consider ordering this additional test [id number] PLATELETS APPEAR INCREASED    nRBC 0.0 0.0 - 0.2 %    Comment: Performed at [facility], [address] [place], [place] [address]  Lactic acid, plasma     Status: Abnormal   Collection Time: 06/28/21  9:51 PM  Result Value Ref Range   Lactic Acid, Venous 2.1 (HH) 0.5 - 1.9 mmol/L    Comment: CRITICAL RESULT CALLED TO, READ BACK BY AND VERIFIED WITH JOANA CEBALLOS '@2236'$  ON 06/28/21 SKL Performed at [facility], [address], [place], [place] [address]   Blood Culture (routine x 2)     Status: None (Preliminary result)   Collection Time: 06/28/21 10:16 PM   Specimen: BLOOD  Result Value Ref Range   Specimen Description BLOOD LEFT ARM    Special Requests      BOTTLES DRAWN AEROBIC AND ANAEROBIC Blood Culture results may not be optimal due to an inadequate volume of blood received in culture bottles   Culture      NO GROWTH < 12 HOURS Performed at [facility], [address] [place], [place] [address]    Report Status PENDING   Blood Culture (routine x 2)     Status: None (Preliminary result)   Collection Time: 06/28/21 10:16 PM   Specimen: BLOOD  Result Value Ref Range   Specimen Description BLOOD LEFT ASSIST CONTROL    Special Requests      BOTTLES DRAWN AEROBIC AND ANAEROBIC Blood Culture results may not be optimal due to an inadequate volume of blood received in culture bottles   Culture      NO GROWTH < 12 HOURS Performed at [facility], [address] [place], [place] [address]    Report Status PENDING   Urinalysis, Complete w Microscopic     Status: Abnormal   Collection Time: 06/28/21 10:16 PM  Result Value Ref Range    Color, Urine AMBER (A) YELLOW    Comment: BIOCHEMICALS MAY BE AFFECTED BY COLOR   APPearance CLOUDY (A) CLEAR   Specific Gravity, Urine 1.020 1.005 - 1.030   pH 5.0 5.0 - 8.0   Glucose, UA NEGATIVE NEGATIVE mg/dL   Hgb urine dipstick LARGE (A) NEGATIVE   Bilirubin Urine NEGATIVE NEGATIVE   Ketones, ur NEGATIVE NEGATIVE mg/dL   Protein, ur 100 (A) NEGATIVE mg/dL   Nitrite POSITIVE (A) NEGATIVE   Leukocytes,Ua MODERATE (A) NEGATIVE   RBC / HPF 21-50 0 - 5 RBC/hpf   WBC, UA >50 (H) 0 - 5 WBC/hpf   Bacteria, UA MANY (A) NONE SEEN   Squamous Epithelial / LPF 0-5 0 - 5   WBC Clumps  PRESENT    Mucus PRESENT    Hyaline Casts, UA PRESENT     Comment: Performed at [facility], [address] [place], [place] [address]  Lactic acid, plasma     Status: None   Collection Time: 06/28/21 10:54 PM  Result Value Ref Range   Lactic Acid, Venous 1.4 0.5 - 1.9 mmol/L    Comment: Performed at [facility], [address] [place], [place] [address]  Protime-INR     Status: Abnormal   Collection Time: 06/29/21  3:00 AM  Result Value Ref Range   Prothrombin Time 15.7 (H) 11.4 - 15.2 seconds   INR 1.3 (H) 0.8 - 1.2    Comment: (NOTE) INR goal varies based on device and disease states. Performed at [facility], [address] [place], [place] [address]   APTT     Status: Abnormal   Collection Time: 06/29/21  3:00 AM  Result Value Ref Range   aPTT 44 (H) 24 - 36 seconds    Comment:        IF BASELINE aPTT IS ELEVATED, SUGGEST PATIENT RISK ASSESSMENT BE USED TO DETERMINE APPROPRIATE ANTICOAGULANT THERAPY. Performed at [facility], [address] [place],  [address]    DG Chest [place] 1 View  Result Date: 06/28/2021 CLINICAL DATA:  Sepsis EXAM: PORTABLE CHEST 1 VIEW COMPARISON:  None Available. FINDINGS: The heart size and mediastinal contours are within normal limits. Both lungs are clear. The visualized skeletal structures are unremarkable.  IMPRESSION: No active disease. Electronically Signed   By: Fidela Salisbury M.D.   On: 06/28/2021 22:39   CT Angio Chest/Abd/Pel for Dissection W and/or Wo Contrast  Result Date: 06/27/2021 CLINICAL DATA:  Back/shoulder pain, shortness of breath. Possible acute aortic syndrome. EXAM: CT ANGIOGRAPHY CHEST, ABDOMEN AND PELVIS TECHNIQUE: Non-contrast CT of the chest was initially obtained. Multidetector CT imaging through the chest, abdomen and pelvis was performed using the standard protocol during bolus administration of intravenous contrast. Multiplanar reconstructed images and MIPs were obtained and reviewed to evaluate the vascular anatomy. RADIATION DOSE REDUCTION: This exam was performed according to the departmental dose-optimization program which includes automated exposure control, adjustment of the mA and/or kV according to patient size and/or use of iterative reconstruction technique. CONTRAST:  179m OMNIPAQUE IOHEXOL 350 MG/ML SOLN COMPARISON:  None Available. FINDINGS: The patient was unable to raise his arms, resulting in streak artifact and mildly reduced signal to noise ratio. CTA CHEST FINDINGS Cardiovascular: The noncontrast images demonstrate no evidence of acute intramural hematoma. There is mild atherosclerotic calcification of the descending thoracic aorta. Ascending thoracic aorta 4.3 cm in diameter on image 81 series 5 compatible with aneurysm. No aortic dissection or acute aortic findings. There is some mild atherosclerosis in the brachiocephalic artery but no high-grade stenosis of the aortic arch branch vessels. Today's exam was not timed for optimal pulmonary arterial opacification, we do not demonstrate a large or central pulmonary embolus in the pulmonary arterial tree. Mediastinum/Nodes: Wall thickening and indistinct margins of the upper thoracic esophagus for example on image 22 series 9, nonspecific although esophagitis is a potential cause for this appearance. No pathologic  adenopathy identified. Lungs/Pleura: Bandlike atelectasis or scarring in the right lower lobe. Musculoskeletal: Thoracic spondylosis. Review of the MIP images confirms the above findings. CTA ABDOMEN AND PELVIS FINDINGS VASCULAR Aorta: Generally mild atherosclerotic calcification. No abdominal aortic aneurysm. Celiac: Subtle proximal narrowing could relate to a small amount of soft plaque, but no high-grade stenosis is  observed. SMA: Widely patent. Incidentally, the SMA provides the right hepatic artery. Renals: Minimal atheromatous plaque at the origin of the upper, dominant right renal artery. A small accessory right renal artery supplies the lower pole. Single left renal artery appears widely patent. IMA: Patent. Inflow: Moderate atherosclerotic calcification without substantial stenosis. Veins: Duplicated infrarenal IVC, a venous variant. Review of the MIP images confirms the above findings. NON-VASCULAR Hepatobiliary: Arterial phase appearance of the liver is within normal limits given the streak artifact from the patient's arm positioning. Gallbladder grossly unremarkable. Pancreas: Unremarkable Spleen: Unremarkable Adrenals/Urinary Tract: Both adrenal glands appear normal. Minimal scarring in the right kidney lower pole. Low position of the urinary bladder related to prostatectomy. No significant abnormal renal parenchymal enhancement. No definite urinary tract calculi. Stomach/Bowel: Mild wall thickening in the ascending colon on image 190 series 5 distributed nondistention. Sigmoid colon diverticulosis. Lymphatic: No pathologic adenopathy. Reproductive: Prostatectomy. Other: No supplemental non-categorized findings. Musculoskeletal: Heterotopic ossification along the pelvic sidewalls. Benign-appearing lipoma in the right external oblique muscle, image 165 series 5. Incidental plate and screw fixator in the right distal radius. Loss of disc height at L5-S1. Schmorl's nodes along the inferior endplates of L1  and L3 with some mild chronic appearing anterior wedging at L1. Mild to moderate multilevel lumbar impingement due to spondylosis and degenerative disc disease. Review of the MIP images confirms the above findings. IMPRESSION: 1. No acute vascular findings. 2. 4.3 cm in diameter ascending thoracic aortic aneurysm. Recommend annual imaging followup by CTA or MRA. This recommendation follows 2010 ACCF/AHA/AATS/ACR/ASA/SCA/SCAI/SIR/STS/SVM Guidelines for the Diagnosis and Management of Patients with Thoracic Aortic Disease. Circulation. 2010; 121: X381-W299. Aortic aneurysm NOS (ICD10-I71.9) 3. Generally mild degrees of atherosclerosis. Aortic Atherosclerosis (ICD10-I70.0). 4. Abnormal wall thickening and indistinct margins of the upper thoracic esophagus. The appearance is nonspecific, although esophagitis would be a potential cause. Correlate with any GI symptoms and patient history in determining whether follow up gastroenterology referral is warranted. 5. Other imaging findings of potential clinical significance: Duplicated infrarenal IVC. Sigmoid colon diverticulosis. 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 Multilevel lumbar impingement. Electronically Signed   By: Van Clines M.D.   On: 06/27/2021 12:17    Pending Labs Unresulted Labs (From admission, onward)     Start     Ordered   07/06/21 0500  Creatinine, serum  (enoxaparin (LOVENOX)    CrCl >/= 30 ml/min)  Weekly,   R     Comments: while on enoxaparin therapy    06/29/21 0233   06/29/21 3716  Basic metabolic panel  Once,   R        06/29/21 0847   06/29/21 0846  Urine Culture  (Urine Culture)  Add-on,   AD       Question:  Indication  Answer:  Sepsis   06/29/21 0845   06/29/21 0320  CBC  Once,   R        06/29/21 0320   06/29/21 0234  HIV Antibody (routine testing w rflx)  (HIV Antibody (Routine testing w reflex) panel)  Once,   R        06/29/21 0233            Vitals/Pain Today's Vitals   06/29/21  0816 06/29/21 0900 06/29/21 0952 06/29/21 1034  BP:  (!) 127/59    Pulse:  83    Resp:  (!) 26    Temp:    98.7 F (37.1 C)  TempSrc:    Oral  SpO2:  96%  Weight:      Height:      PainSc: 5   10-Worst pain ever     Isolation Precautions No active isolations  Medications Medications  atorvastatin (LIPITOR) tablet 40 mg (has no administration in time range)  gabapentin (NEURONTIN) capsule 300 mg (has no administration in time range)  acetaminophen (TYLENOL) tablet 650 mg (has no administration in time range)    Or  acetaminophen (TYLENOL) suppository 650 mg (has no administration in time range)  traZODone (DESYREL) tablet 25 mg (has no administration in time range)  senna-docusate (Senokot-S) tablet 1 tablet (has no administration in time range)  bisacodyl (DULCOLAX) EC tablet 5 mg (has no administration in time range)  ondansetron (ZOFRAN) tablet 4 mg (has no administration in time range)    Or  ondansetron (ZOFRAN) injection 4 mg (has no administration in time range)  albuterol (PROVENTIL) (2.5 MG/3ML) 0.083% nebulizer solution 2.5 mg (has no administration in time range)  ipratropium (ATROVENT) nebulizer solution 0.5 mg (has no administration in time range)  enoxaparin (LOVENOX) injection 40 mg (has no administration in time range)  cefTRIAXone (ROCEPHIN) 2 g in sodium chloride 0.9 % 100 mL IVPB (0 g Intravenous Stopped 06/29/21 0320)  pantoprazole (PROTONIX) EC tablet 40 mg (40 mg Oral Given 06/29/21 1018)  morphine (PF) 2 MG/ML injection 2 mg (2 mg Intravenous Given 06/29/21 0738)  ketorolac (TORADOL) 30 MG/ML injection 30 mg (30 mg Intravenous Patient Refused/Not Given 06/29/21 0951)  lidocaine (LIDODERM) 5 % 1 patch (1 patch Transdermal Patient Refused/Not Given 06/29/21 0952)  fentaNYL (SUBLIMAZE) injection 50 mcg (50 mcg Intravenous Given 06/29/21 1018)  lactated ringers bolus 1,000 mL (0 mLs Intravenous Stopped 06/28/21 2304)  vancomycin (VANCOCIN) IVPB 1000 mg/200 mL  premix (0 mg Intravenous Stopped 06/29/21 0033)  ceFEPIme (MAXIPIME) 2 g in sodium chloride 0.9 % 100 mL IVPB (0 g Intravenous Stopped 06/29/21 0132)  metroNIDAZOLE (FLAGYL) IVPB 500 mg (0 mg Intravenous Stopped 06/28/21 2301)  LORazepam (ATIVAN) tablet 2 mg (2 mg Oral Given 06/29/21 1018)    Mobility walks Low fall risk   Focused Assessments Cardiac Assessment Handoff:  Cardiac Rhythm: Normal sinus rhythm No results found for: "CKTOTAL", "CKMB", "CKMBINDEX", "TROPONINI" No results found for: "DDIMER" Does the Patient currently have chest pain? No    R Recommendations: See Admitting Provider Note  Report given to:   Additional Notes:

## 2021-06-29 NOTE — H&P (Addendum)
History and Physical   [facility] - [place] @ [facility] Admission History and Physical [employer], D.O.    Patient Name: Andrew Mullins MR#: [id number] Date of Birth: January 01, 1956 Date of Admission: 06/28/2021  Referring MD/NP/PA: Dr. Archie Balboa Primary Care Physician: Baxter Hire, MD  Chief Complaint:  Chief Complaint  Patient presents with   Altered Mental Status   Fever    HPI: Andrew Mullins is a 66 y.o. male with a known history of CVA with residual weakness, prostate CA, HTN, GERD< esophageal cancer  presents to the emergency department for evaluation of shaking chills and fever.  He was seen in our ER two days ago for symptoms of a pinched nerve in his back that started after doing work in the yard.  He was given muscle relaxant, lidocaine patch and discharged home.  He returns today with complaint of confusion and weakness. Has  been taking morphine '18mg'$  PO daily for help with back pain.   Patient denies  weakness, dizziness, chest pain, shortness of breath, N/V/C/D, abdominal pain, dysuria/frequency, changes in mental status.    Otherwise there has been no change in status. Patient has been taking medication as prescribed and there has been no recent change in medication or diet.  No recent antibiotics.  There has been no recent illness, hospitalizations, travel or sick contacts.    EMS/ED Course: Patient received Maxipime, Flagyl, Vanco, LR. Medical admission has been requested for further management of Sepsis 2/2 UTI.  Review of Systems:  CONSTITUTIONAL: Positive fever/chills, fatigue, weakness, No weight gain/loss, headache. EYES: No blurry or double vision. ENT: No tinnitus, postnasal drip, redness or soreness of the oropharynx. RESPIRATORY: No cough, dyspnea, wheeze.  No hemoptysis.  CARDIOVASCULAR: No chest pain, palpitations, syncope, orthopnea. No lower extremity edema.  GASTROINTESTINAL: No nausea, vomiting, abdominal pain, diarrhea, constipation.   No hematemesis, melena or hematochezia. GENITOURINARY: No dysuria, frequency, hematuria. ENDOCRINE: No polyuria or nocturia. No heat or cold intolerance. HEMATOLOGY: No anemia, bruising, bleeding. INTEGUMENTARY: No rashes, ulcers, lesions. MUSCULOSKELETAL: No arthritis, gout, dyspnea. NEUROLOGIC: No numbness, tingling, ataxia, seizure-type activity, weakness. PSYCHIATRIC: No anxiety, depression, insomnia.   Past Medical History:  Diagnosis Date   Cancer [facility])    esophageal   Complication of anesthesia    GERD (gastroesophageal reflux disease)    Hypertension    Prostate cancer ([place])    Stroke ([place]) 2020   no wealness    Past Surgical History:  Procedure Laterality Date   broken bones repair     COLONOSCOPY WITH PROPOFOL N/A 11/11/2020   Procedure: COLONOSCOPY WITH PROPOFOL;  Surgeon: [name], MD;  Location: [facility] ENDOSCOPY;  Service: Gastroenterology;  Laterality: N/A;   ESOPHAGOGASTRODUODENOSCOPY (EGD) WITH PROPOFOL N/A 02/18/2018   Procedure: ESOPHAGOGASTRODUODENOSCOPY (EGD) WITH PROPOFOL;  Surgeon: [name], MD;  Location: [facility] ENDOSCOPY;  Service: Gastroenterology;  Laterality: N/A;   ESOPHAGOGASTRODUODENOSCOPY (EGD) WITH PROPOFOL N/A 04/01/2018   Procedure: ESOPHAGOGASTRODUODENOSCOPY (EGD) WITH PROPOFOL;  Surgeon: [name], MD;  Location: [facility] ENDOSCOPY;  Service: Gastroenterology;  Laterality: N/A;   ESOPHAGOGASTRODUODENOSCOPY (EGD) WITH PROPOFOL N/A 12/02/2018   Procedure: ESOPHAGOGASTRODUODENOSCOPY (EGD) WITH PROPOFOL with Dilation;  Surgeon: [name], MD;  Location: [facility] ENDOSCOPY;  Service: Gastroenterology;  Laterality: N/A;   ESOPHAGOGASTRODUODENOSCOPY (EGD) WITH PROPOFOL N/A 05/19/2019   Procedure: ESOPHAGOGASTRODUODENOSCOPY (EGD) WITH PROPOFOL;  Surgeon: [name], MD;  Location: [facility] ENDOSCOPY;  Service: Gastroenterology;  Laterality: N/A;   ESOPHAGOGASTRODUODENOSCOPY (EGD) WITH PROPOFOL N/A 05/18/2019   Procedure: ESOPHAGOGASTRODUODENOSCOPY (EGD) WITH  PROPOFOL with Dilation;  Surgeon: Vicente Males,  Bailey Mech, MD;  Location: [place];  Service: Gastroenterology;  Laterality: N/A;   ESOPHAGOGASTRODUODENOSCOPY (EGD) WITH PROPOFOL N/A 06/25/2019   Procedure: ESOPHAGOGASTRODUODENOSCOPY (EGD) WITH PROPOFOL  with Dilation;  Surgeon: [name], MD;  Location: [facility] ENDOSCOPY;  Service: Gastroenterology;  Laterality: N/A;   ESOPHAGOGASTRODUODENOSCOPY (EGD) WITH PROPOFOL N/A 07/09/2019   Procedure: ESOPHAGOGASTRODUODENOSCOPY (EGD) WITH PROPOFOL with Dilation;  Surgeon: [name], MD;  Location: [facility] ENDOSCOPY;  Service: Gastroenterology;  Laterality: N/A;  Pt requests early morning   ESOPHAGOGASTRODUODENOSCOPY (EGD) WITH PROPOFOL N/A 08/11/2019   Procedure: ESOPHAGOGASTRODUODENOSCOPY (EGD) WITH PROPOFOL with Dilation;  Surgeon: [name], MD;  Location: [facility] ENDOSCOPY;  Service: Gastroenterology;  Laterality: N/A;   ESOPHAGOGASTRODUODENOSCOPY (EGD) WITH PROPOFOL N/A 09/11/2019   Procedure: ESOPHAGOGASTRODUODENOSCOPY (EGD) WITH PROPOFOL with Dilation;  Surgeon: [name], MD;  Location: [facility] ENDOSCOPY;  Service: Gastroenterology;  Laterality: N/A;   ESOPHAGOGASTRODUODENOSCOPY (EGD) WITH PROPOFOL N/A 10/02/2019   Procedure: ESOPHAGOGASTRODUODENOSCOPY (EGD) WITH PROPOFOL with Dilation;  Surgeon: [name], MD;  Location: [facility] ENDOSCOPY;  Service: Gastroenterology;  Laterality: N/A;   ESOPHAGOGASTRODUODENOSCOPY (EGD) WITH PROPOFOL N/A 11/04/2019   Procedure: ESOPHAGOGASTRODUODENOSCOPY (EGD) WITH PROPOFOL;  Surgeon: [name], MD;  Location: [facility] ENDOSCOPY;  Service: Gastroenterology;  Laterality: N/A;   ESOPHAGOGASTRODUODENOSCOPY (EGD) WITH PROPOFOL N/A 02/05/2020   Procedure: ESOPHAGOGASTRODUODENOSCOPY (EGD) WITH PROPOFOL;  Surgeon: [name], MD;  Location: [facility] ENDOSCOPY;  Service: Gastroenterology;  Laterality: N/A;   ESOPHAGOGASTRODUODENOSCOPY (EGD) WITH PROPOFOL N/A 02/19/2020   Procedure: ESOPHAGOGASTRODUODENOSCOPY (EGD) WITH PROPOFOL;  Surgeon:  [name], MD;  Location: [facility] ENDOSCOPY;  Service: Gastroenterology;  Laterality: N/A;   ESOPHAGOGASTRODUODENOSCOPY (EGD) WITH PROPOFOL N/A 03/10/2020   Procedure: ESOPHAGOGASTRODUODENOSCOPY (EGD) WITH PROPOFOL;  Surgeon: [name], MD;  Location: [facility] ENDOSCOPY;  Service: Gastroenterology;  Laterality: N/A;   ESOPHAGOGASTRODUODENOSCOPY (EGD) WITH PROPOFOL N/A 03/31/2020   Procedure: ESOPHAGOGASTRODUODENOSCOPY (EGD) WITH PROPOFOL;  Surgeon: [name], MD;  Location: [facility] ENDOSCOPY;  Service: Gastroenterology;  Laterality: N/A;  7:30 AM PROCEDURE PER DR ANNA C-19 TEST ON 03/30/2020 AM   ESOPHAGOGASTRODUODENOSCOPY (EGD) WITH PROPOFOL N/A 04/22/2020   Procedure: ESOPHAGOGASTRODUODENOSCOPY (EGD) WITH PROPOFOL;  Surgeon: [name], MD;  Location: [facility] ENDOSCOPY;  Service: Gastroenterology;  Laterality: N/A;   ESOPHAGOGASTRODUODENOSCOPY (EGD) WITH PROPOFOL N/A 05/26/2020   Procedure: ESOPHAGOGASTRODUODENOSCOPY (EGD) WITH PROPOFOL;  Surgeon: [name], MD;  Location: [facility] ENDOSCOPY;  Service: Gastroenterology;  Laterality: N/A;   EYE SURGERY     retinal detatchment   radical prostate     TONSILLECTOMY       reports that he has quit smoking. He has never used smokeless tobacco. He reports that he does not currently use alcohol. He reports that he does not use drugs.  Allergies  Allergen Reactions   Hydralazine Other (See Comments)    Flushing, anxiety-per [facility]    No family history on file.  Prior to Admission medications   Medication Sig Start Date End Date Taking? Authorizing Provider  aspirin EC 325 MG EC tablet Take 1 tablet (325 mg total) by mouth daily. 09/19/18   Fritzi Mandes, MD  atorvastatin (LIPITOR) 40 MG tablet Take 1 tablet (40 mg total) by mouth daily at 6 PM. 09/18/18   Fritzi Mandes, MD  cyclobenzaprine (FLEXERIL) 10 MG tablet Take 1 tablet (10 mg total) by mouth 3 (three) times daily as needed for up to 3 days for muscle spasms. 06/27/21 06/30/21  Lucrezia Starch, MD   famciclovir (FAMVIR) 250 MG tablet Take 250 mg by mouth 2 (two) times daily.    [provider]  gabapentin (NEURONTIN) 300 MG capsule Take 300 mg by mouth 3 (three) times daily.    [provider]  losartan (COZAAR) 50 MG tablet Take 50 mg by mouth daily. 08/20/18   [provider]  metoprolol tartrate (LOPRESSOR) 25 MG tablet Take 25 mg by mouth 2 (two) times daily.    [provider]  omeprazole (PRILOSEC) 20 MG capsule Take 20 mg by mouth daily.    [provider]    Physical Exam: Vitals:   06/28/21 2145 06/28/21 2148 06/28/21 2150 06/28/21 2300  BP:  (!) 150/67 (!) 150/67   Pulse:  (!) 120 (!) 120   Resp:  18 (!) 24 17  Temp:  (!) 102 F (38.9 C)  100.2 F (37.9 C)  TempSrc:  Oral  Oral  SpO2:  93% 92%   Weight: 93 kg     Height: 6' (1.829 m)       GENERAL: 66 y.o.-year-old white male patient, well-developed, well-nourished lying in the bed in no acute distress.  Pleasant and cooperative.   HEENT: Head atraumatic, normocephalic. Pupils equal. Mucus membranes dry.  NECK: Supple. No JVD. CHEST: Normal breath sounds bilaterally. No wheezing, rales, rhonchi or crackles.  CARDIOVASCULAR: S1, S2 normal. No murmurs, rubs, or gallops. Cap refill <2 seconds. Pulses intact distally.  ABDOMEN: Soft, nondistended, nontender. No rebound, guarding, rigidity. Normoactive bowel sounds present in all four quadrants.  EXTREMITIES: No pedal edema, cyanosis, or clubbing. No calf tenderness or Homan's sign.  NEUROLOGIC: The patient is alert and oriented x 3. Cranial nerves II through XII are grossly intact with no focal sensorimotor deficit. RUE and RLE weakness PSYCHIATRIC:  Normal affect, mood, thought content. SKIN: Warm, dry, and intact without obvious rash, lesion, or ulcer.    Labs on Admission:  CBC: Recent Labs  Lab 06/27/21 1137 06/28/21 2151  WBC 9.1 5.6  NEUTROABS 8.2*  --   HGB 12.2* 10.7*  HCT 35.9* 31.4*  MCV 81.8 82.2  PLT  141* 84*   Basic Metabolic Panel: Recent Labs  Lab 06/27/21 1137 06/28/21 2151  NA 131* 126*  K 3.0* 3.5  CL 98 94*  CO2 24 23  GLUCOSE 136* 101*  BUN 13 27*  CREATININE 0.60* 1.00  CALCIUM 8.9 8.2*  MG 1.5*  --    GFR: Estimated Creatinine Clearance: 80.8 mL/min (by C-G formula based on SCr of 1 mg/dL). Liver Function Tests: Recent Labs  Lab 06/27/21 1137 06/28/21 2151  AST 16 36  ALT 13 19  ALKPHOS 54 57  BILITOT 1.1 1.0  PROT 7.5 6.3*  ALBUMIN 3.4* 2.8*   No results for input(s): "LIPASE", "AMYLASE" in the last 168 hours. No results for input(s): "AMMONIA" in the last 168 hours. Coagulation Profile: No results for input(s): "INR", "PROTIME" in the last 168 hours. Cardiac Enzymes: No results for input(s): "CKTOTAL", "CKMB", "CKMBINDEX", "TROPONINI" in the last 168 hours. BNP (last 3 results) No results for input(s): "PROBNP" in the last 8760 hours. HbA1C: No results for input(s): "HGBA1C" in the last 72 hours. CBG: No results for input(s): "GLUCAP" in the last 168 hours. Lipid Profile: No results for input(s): "CHOL", "HDL", "LDLCALC", "TRIG", "CHOLHDL", "LDLDIRECT" in the last 72 hours. Thyroid Function Tests: No results for input(s): "TSH", "T4TOTAL", "FREET4", "T3FREE", "THYROIDAB" in the last 72 hours. Anemia Panel: No results for input(s): "VITAMINB12", "FOLATE", "FERRITIN", "TIBC", "IRON", "RETICCTPCT" in the last 72 hours. Urine analysis:    Component Value Date/Time   COLORURINE AMBER (A) 06/28/2021 2216  APPEARANCEUR CLOUDY (A) 06/28/2021 2216   LABSPEC 1.020 06/28/2021 2216   PHURINE 5.0 06/28/2021 2216   GLUCOSEU NEGATIVE 06/28/2021 2216   HGBUR LARGE (A) 06/28/2021 2216   [place] NEGATIVE 06/28/2021 2216   [place] 06/28/2021 2216   PROTEINUR 100 (A) 06/28/2021 2216   NITRITE POSITIVE (A) 06/28/2021 2216   LEUKOCYTESUR MODERATE (A) 06/28/2021 2216   Sepsis Labs: '@LABRCNTIP'$ (procalcitonin:4,lacticidven:4) ) Recent Results  (from the past 240 hour(s))  SARS Coronavirus 2 by RT PCR (hospital order, performed in [place] hospital lab) *cepheid single result test* Anterior Nasal Swab     Status: None   Collection Time: 06/27/21 11:37 AM   Specimen: Anterior Nasal Swab  Result Value Ref Range Status   SARS Coronavirus 2 by RT PCR NEGATIVE NEGATIVE Final    Comment: (NOTE) SARS-CoV-2 target nucleic acids are NOT DETECTED.  The SARS-CoV-2 RNA is generally detectable in upper and lower respiratory specimens during the acute phase of infection. The lowest concentration of SARS-CoV-2 viral copies this assay can detect is 250 copies / mL. A negative result does not preclude SARS-CoV-2 infection and should not be used as the sole basis for treatment or other patient management decisions.  A negative result may occur with improper specimen collection / handling, submission of specimen other than nasopharyngeal swab, presence of viral mutation(s) within the areas targeted by this assay, and inadequate number of viral copies (<250 copies / mL). A negative result must be combined with clinical observations, patient history, and epidemiological information.  Fact Sheet for Patients:   https://www.patel.info/  Fact Sheet for Healthcare Providers: https://hall.com/  This test is not yet approved or  cleared by the [place] FDA and has been authorized for detection and/or diagnosis of SARS-CoV-2 by FDA under an Emergency Use Authorization (EUA).  This EUA will remain in effect (meaning this test can be used) for the duration of the COVID-19 declaration under Section 564(b)(1) of the Act, 21 U.S.C. section 360bbb-3(b)(1), unless the authorization is terminated or revoked sooner.  Performed at [facility], [address], [place], [place] [address]      Radiological Exams on Admission: DG Chest [facility] 1 View  Result Date: 06/28/2021 CLINICAL DATA:  Sepsis  EXAM: PORTABLE CHEST 1 VIEW COMPARISON:  None Available. FINDINGS: The heart size and mediastinal contours are within normal limits. Both lungs are clear. The visualized skeletal structures are unremarkable. IMPRESSION: No active disease. Electronically Signed   By: Fidela Salisbury M.D.   On: 06/28/2021 22:39   CT Angio Chest/Abd/Pel for Dissection W and/or Wo Contrast  Result Date: 06/27/2021 CLINICAL DATA:  Back/shoulder pain, shortness of breath. Possible acute aortic syndrome. EXAM: CT ANGIOGRAPHY CHEST, ABDOMEN AND PELVIS TECHNIQUE: Non-contrast CT of the chest was initially obtained. Multidetector CT imaging through the chest, abdomen and pelvis was performed using the standard protocol during bolus administration of intravenous contrast. Multiplanar reconstructed images and MIPs were obtained and reviewed to evaluate the vascular anatomy. RADIATION DOSE REDUCTION: This exam was performed according to the departmental dose-optimization program which includes automated exposure control, adjustment of the mA and/or kV according to patient size and/or use of iterative reconstruction technique. CONTRAST:  126m OMNIPAQUE IOHEXOL 350 MG/ML SOLN COMPARISON:  None Available. FINDINGS: The patient was unable to raise his arms, resulting in streak artifact and mildly reduced signal to noise ratio. CTA CHEST FINDINGS Cardiovascular: The noncontrast images demonstrate no evidence of acute intramural hematoma. There is mild atherosclerotic calcification of the descending thoracic aorta. Ascending thoracic  aorta 4.3 cm in diameter on image 81 series 5 compatible with aneurysm. No aortic dissection or acute aortic findings. There is some mild atherosclerosis in the brachiocephalic artery but no high-grade stenosis of the aortic arch branch vessels. Today's exam was not timed for optimal pulmonary arterial opacification, we do not demonstrate a large or central pulmonary embolus in the pulmonary arterial tree.  Mediastinum/Nodes: Wall thickening and indistinct margins of the upper thoracic esophagus for example on image 22 series 9, nonspecific although esophagitis is a potential cause for this appearance. No pathologic adenopathy identified. Lungs/Pleura: Bandlike atelectasis or scarring in the right lower lobe. Musculoskeletal: Thoracic spondylosis. Review of the MIP images confirms the above findings. CTA ABDOMEN AND PELVIS FINDINGS VASCULAR Aorta: Generally mild atherosclerotic calcification. No abdominal aortic aneurysm. Celiac: Subtle proximal narrowing could relate to a small amount of soft plaque, but no high-grade stenosis is observed. SMA: Widely patent. Incidentally, the SMA provides the right hepatic artery. Renals: Minimal atheromatous plaque at the origin of the upper, dominant right renal artery. A small accessory right renal artery supplies the lower pole. Single left renal artery appears widely patent. IMA: Patent. Inflow: Moderate atherosclerotic calcification without substantial stenosis. Veins: Duplicated infrarenal IVC, a venous variant. Review of the MIP images confirms the above findings. NON-VASCULAR Hepatobiliary: Arterial phase appearance of the liver is within normal limits given the streak artifact from the patient's arm positioning. Gallbladder grossly unremarkable. Pancreas: Unremarkable Spleen: Unremarkable Adrenals/Urinary Tract: Both adrenal glands appear normal. Minimal scarring in the right kidney lower pole. Low position of the urinary bladder related to prostatectomy. No significant abnormal renal parenchymal enhancement. No definite urinary tract calculi. Stomach/Bowel: Mild wall thickening in the ascending colon on image 190 series 5 distributed nondistention. Sigmoid colon diverticulosis. Lymphatic: No pathologic adenopathy. Reproductive: Prostatectomy. Other: No supplemental non-categorized findings. Musculoskeletal: Heterotopic ossification along the pelvic sidewalls.  Benign-appearing lipoma in the right external oblique muscle, image 165 series 5. Incidental plate and screw fixator in the right distal radius. Loss of disc height at L5-S1. Schmorl's nodes along the inferior endplates of L1 and L3 with some mild chronic appearing anterior wedging at L1. Mild to moderate multilevel lumbar impingement due to spondylosis and degenerative disc disease. Review of the MIP images confirms the above findings. IMPRESSION: 1. No acute vascular findings. 2. 4.3 cm in diameter ascending thoracic aortic aneurysm. Recommend annual imaging followup by CTA or MRA. This recommendation follows 2010 ACCF/AHA/AATS/ACR/ASA/SCA/SCAI/SIR/STS/SVM Guidelines for the Diagnosis and Management of Patients with Thoracic Aortic Disease. Circulation. 2010; 121: D664-Q034. Aortic aneurysm NOS (ICD10-I71.9) 3. Generally mild degrees of atherosclerosis. Aortic Atherosclerosis (ICD10-I70.0). 4. Abnormal wall thickening and indistinct margins of the upper thoracic esophagus. The appearance is nonspecific, although esophagitis would be a potential cause. Correlate with any GI symptoms and patient history in determining whether follow up gastroenterology referral is warranted. 5. Other imaging findings of potential clinical significance: Duplicated infrarenal IVC. Sigmoid colon diverticulosis. 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 Multilevel lumbar impingement. Electronically Signed   By: Van Clines M.D.   On: 06/27/2021 12:17    EKG: Sinus tachy at 111 bpm with normal axis and nonspecific ST-T wave changes.   Assessment/Plan  This is a 66 y.o. male with a history of CVA with residual weakness, prostate CA, HTN, GERD< esophageal cancer   now being admitted with:  #. Sepsis secondary to UTI - Admit to inpatient with telemetry monitoring - IV antibiotics: Rocephin - IV fluid hydration - Follow up blood,urine cultures - Repeat CBC  in am.    #. History of CVA - Continue  aspirin, Lipitor  #. History of HTN - Hold Cozaaar, Lopressor 2/2 hypotension  #. History of GERD - Continue Protonix for Prilosec  #. History of back pain - Continue neurontin, hold Flexeril 2/2 confusion  Admission status: IP, tele IV Fluids: NS Diet/Nutrition: Heart healthy Consults called: None  DVT Px: Lovenox, SCDs and early ambulation. Code Status: Full Code  Disposition Plan: To home in 1-2 days  All the records are reviewed and case discussed with ED provider. Management plans discussed with the patient and/or family who express understanding and agree with plan of care.  Dennisse Swader D.O. on 06/29/2021 at 12:34 AM CC: Primary care physician; Baxter Hire, MD   06/29/2021, 12:34 AM

## 2021-06-29 NOTE — H&P (View-Only) (Signed)
Neurosurgery-New Consultation Evaluation 06/29/2021 Andrew Mullins [id number]  Identifying Statement: Andrew Mullins is a 66 y.o. male from [place] [id number] with a history of CVA, prostate CA, HTN, GERD, Esophageal cancer, and chronic lumbosacral complaints   Physician Requesting Consultation: No ref. provider found  History of Present Illness: Andrew Mullins is a 66 y.o presenting to the ER initially on 06/27/21 for evaluation of SOB with associated upper back and interscapular pain.  He states that this started about a day after doing some concrete work.  He was initially seen by his chiropractor who recommended further evaluation in the ER for concern of a pinched nerve.  He was treated with IV pain medication, muscle relaxants, and a lidocaine patch, his pain improved and he was subsequently discharged home.  He presented back to the ER on 06/28/2021 with confusion and a fever.  He was subsequently admitted for further evaluation and found to meet sepsis criteria.  His blood pressures have improved with fluids and he was started on broad-spectrum antibiotics.  He was found to have a UA positive for nitrates and leukocytes. Today he reports his confusion is better.  He continues to have interscapular pain worse on the left without any radiating pain into his arms or torso.  He states that it is improved since onset but is still about a 6 out of 10.  He also endorses chronic lumbosacral complaints which he has been undergoing chiropractic treatment for.  He is not currently having any radiating leg pain.  Past Medical History:  Past Medical History:  Diagnosis Date   Cancer ([place])    esophageal   Complication of anesthesia    GERD (gastroesophageal reflux disease)    Hypertension    Prostate cancer ([place])    Stroke ([place]) 2020   no wealness    Social History: Social History   Socioeconomic History   Marital status: Married    Spouse name: Not on file   Number of children:  Not on file   Years of education: Not on file   Highest education level: Not on file  Occupational History   Not on file  Tobacco Use   Smoking status: Former   Smokeless tobacco: Never  Vaping Use   Vaping Use: Never used  Substance and Sexual Activity   Alcohol use: Not Currently   Drug use: Never   Sexual activity: Not on file  Other Topics Concern   Not on file  Social History Narrative   Lives at home with wife   Social Determinants of Health   Financial Resource Strain: Not on file  Food Insecurity: Not on file  Transportation Needs: Not on file  Physical Activity: Not on file  Stress: Not on file  Social Connections: Not on file  Intimate Partner Violence: Not on file   Living arrangements (living alone, with partner): wife  Family History: History reviewed. No pertinent family history.  Review of Systems:  Review of Systems - General ROS: Negative Psychological ROS: Negative Ophthalmic ROS: Negative ENT ROS: Negative Hematological and Lymphatic ROS: Negative  Endocrine ROS: Negative Respiratory ROS: Negative Cardiovascular ROS: Negative Gastrointestinal ROS: Negative Genito-Urinary ROS: Negative Musculoskeletal ROS: Negative Neurological ROS: Negative Dermatological ROS: Negative  Physical Exam: BP (!) 151/81 (BP Location: Left Arm)   Pulse (!) 110   Temp 98.8 F (37.1 C)   Resp 18   Ht 6' (1.829 m)   Wt 93 kg   SpO2 94%   BMI 27.80 kg/m  Body  mass index is 27.8 kg/m. Body surface area is 2.17 meters squared. General appearance: Alert, cooperative, in no acute distress Head: Normocephalic, atraumatic Eyes: Normal, EOM intact Oropharynx: Moist without lesions Neck: Supple, no tenderness Heart: Normal, regular rate and rhythm Lungs: good air exchange.  No obvious increased work of breathing Abdomen: Soft, nondistended Ext: No edema in LE bilaterally, good distal pulses  Neurologic exam:  Mental status: alertness: alert, orientation:  person, place, time, affect: normal Speech: fluent and clear Cranial nerves:  CN II-XII grossly intact  Motor:strength symmetric 5/5, normal muscle mass and tone in all extremities and no pronator drift Sensory: intact to light touch in all extremities Reflexes: 2+ and symmetric bilaterally for arms and legs Coordination: intact finger to nose Gait: untested   Laboratory: Results for orders placed or performed during the hospital encounter of 06/28/21  Blood Culture (routine x 2)   Specimen: BLOOD  Result Value Ref Range   Specimen Description BLOOD LEFT ARM    Special Requests      BOTTLES DRAWN AEROBIC AND ANAEROBIC Blood Culture results may not be optimal due to an inadequate volume of blood received in culture bottles   Culture  Setup Time      Organism ID to follow ANAEROBIC BOTTLE ONLY [place] TO, READ BACK BY AND VERIFIED WITH: [facility] MITCHELL 06/29/21 [place] Performed at [facility], [address] [place], [place] [address]    Culture GRAM POSITIVE COCCI    Report Status PENDING   Blood Culture (routine x 2)   Specimen: BLOOD  Result Value Ref Range   Specimen Description BLOOD LEFT ASSIST CONTROL    Special Requests      BOTTLES DRAWN AEROBIC AND ANAEROBIC Blood Culture results may not be optimal due to an inadequate volume of blood received in culture bottles   Culture  Setup Time      ANAEROBIC BOTTLE ONLY GRAM POSITIVE COCCI CRITICAL RESULT CALLED TO, READ BACK BY AND VERIFIED WITH: [facility] MITCHELL 06/29/21 [place] Performed at [facility], [address] [place], [place] [address]    Culture GRAM POSITIVE COCCI    Report Status PENDING   Blood Culture ID Panel (Reflexed)  Result Value Ref Range   Enterococcus faecalis NOT DETECTED NOT DETECTED   Enterococcus Faecium NOT DETECTED NOT DETECTED   Listeria monocytogenes NOT DETECTED NOT DETECTED   Staphylococcus species NOT DETECTED NOT DETECTED    Staphylococcus aureus (BCID) NOT DETECTED NOT DETECTED   Staphylococcus epidermidis NOT DETECTED NOT DETECTED   Staphylococcus lugdunensis NOT DETECTED NOT DETECTED   Streptococcus species DETECTED (A) NOT DETECTED   Streptococcus agalactiae NOT DETECTED NOT DETECTED   Streptococcus pneumoniae NOT DETECTED NOT DETECTED   Streptococcus pyogenes NOT DETECTED NOT DETECTED   A.calcoaceticus-baumannii NOT DETECTED NOT DETECTED   Bacteroides fragilis NOT DETECTED NOT DETECTED   Enterobacterales NOT DETECTED NOT DETECTED   Enterobacter cloacae complex NOT DETECTED NOT DETECTED   Escherichia coli NOT DETECTED NOT DETECTED   Klebsiella aerogenes NOT DETECTED NOT DETECTED   Klebsiella oxytoca NOT DETECTED NOT DETECTED   Klebsiella pneumoniae NOT DETECTED NOT DETECTED   Proteus species NOT DETECTED NOT DETECTED   Salmonella species NOT DETECTED NOT DETECTED   Serratia marcescens NOT DETECTED NOT DETECTED   Haemophilus influenzae NOT DETECTED NOT DETECTED   Neisseria meningitidis NOT DETECTED NOT DETECTED   Pseudomonas aeruginosa NOT DETECTED NOT DETECTED   Stenotrophomonas maltophilia NOT DETECTED NOT DETECTED   Candida albicans  NOT DETECTED NOT DETECTED   Candida auris NOT DETECTED NOT DETECTED   Candida glabrata NOT DETECTED NOT DETECTED   Candida krusei NOT DETECTED NOT DETECTED   Candida parapsilosis NOT DETECTED NOT DETECTED   Candida tropicalis NOT DETECTED NOT DETECTED   Cryptococcus neoformans/gattii NOT DETECTED NOT DETECTED  Comprehensive metabolic panel  Result Value Ref Range   Sodium 126 (L) 135 - 145 mmol/L   Potassium 3.5 3.5 - 5.1 mmol/L   Chloride 94 (L) 98 - 111 mmol/L   CO2 23 22 - 32 mmol/L   Glucose, Bld 101 (H) 70 - 99 mg/dL   BUN 27 (H) 8 - 23 mg/dL   Creatinine, Ser 1.00 0.61 - 1.24 mg/dL   Calcium 8.2 (L) 8.9 - 10.3 mg/dL   Total Protein 6.3 (L) 6.5 - 8.1 g/dL   Albumin 2.8 (L) 3.5 - 5.0 g/dL   AST 36 15 - 41 U/L   ALT 19 0 - 44 U/L   Alkaline Phosphatase 57  38 - 126 U/L   Total Bilirubin 1.0 0.3 - 1.2 mg/dL   GFR, Estimated >60 >60 mL/min   Anion gap 9 5 - 15  CBC  Result Value Ref Range   WBC 5.6 4.0 - 10.5 K/uL   RBC 3.82 (L) 4.22 - 5.81 MIL/uL   Hemoglobin 10.7 (L) 13.0 - 17.0 g/dL   HCT 31.4 (L) 39.0 - 52.0 %   MCV 82.2 80.0 - 100.0 fL   MCH 28.0 26.0 - 34.0 pg   MCHC 34.1 30.0 - 36.0 g/dL   RDW 13.0 11.5 - 15.5 %   Platelets 84 (L) 150 - 400 K/uL   nRBC 0.0 0.0 - 0.2 %  Lactic acid, plasma  Result Value Ref Range   Lactic Acid, Venous 2.1 (HH) 0.5 - 1.9 mmol/L  Urinalysis, Complete w Microscopic  Result Value Ref Range   Color, Urine AMBER (A) YELLOW   APPearance CLOUDY (A) CLEAR   Specific Gravity, Urine 1.020 1.005 - 1.030   pH 5.0 5.0 - 8.0   Glucose, UA NEGATIVE NEGATIVE mg/dL   Hgb urine dipstick LARGE (A) NEGATIVE   Bilirubin Urine NEGATIVE NEGATIVE   Ketones, ur NEGATIVE NEGATIVE mg/dL   Protein, ur 100 (A) NEGATIVE mg/dL   Nitrite POSITIVE (A) NEGATIVE   Leukocytes,Ua MODERATE (A) NEGATIVE   RBC / HPF 21-50 0 - 5 RBC/hpf   WBC, UA >50 (H) 0 - 5 WBC/hpf   Bacteria, UA MANY (A) NONE SEEN   Squamous Epithelial / LPF 0-5 0 - 5   WBC Clumps PRESENT    Mucus PRESENT    Hyaline Casts, UA PRESENT   Lactic acid, plasma  Result Value Ref Range   Lactic Acid, Venous 1.4 0.5 - 1.9 mmol/L  HIV Antibody (routine testing w rflx)  Result Value Ref Range   HIV Screen 4th Generation wRfx Non Reactive Non Reactive  Protime-INR  Result Value Ref Range   Prothrombin Time 15.7 (H) 11.4 - 15.2 seconds   INR 1.3 (H) 0.8 - 1.2  APTT  Result Value Ref Range   aPTT 44 (H) 24 - 36 seconds  CBC  Result Value Ref Range   WBC 7.0 4.0 - 10.5 K/uL   RBC 3.78 (L) 4.22 - 5.81 MIL/uL   Hemoglobin 10.6 (L) 13.0 - 17.0 g/dL   HCT 31.0 (L) 39.0 - 52.0 %   MCV 82.0 80.0 - 100.0 fL   MCH 28.0 26.0 - 34.0 pg   MCHC 34.2  30.0 - 36.0 g/dL   RDW 13.1 11.5 - 15.5 %   Platelets 79 (L) 150 - 400 K/uL   nRBC 0.0 0.0 - 0.2 %  Basic metabolic  panel  Result Value Ref Range   Sodium 129 (L) 135 - 145 mmol/L   Potassium 3.2 (L) 3.5 - 5.1 mmol/L   Chloride 99 98 - 111 mmol/L   CO2 24 22 - 32 mmol/L   Glucose, Bld 155 (H) 70 - 99 mg/dL   BUN 21 8 - 23 mg/dL   Creatinine, Ser 0.78 0.61 - 1.24 mg/dL   Calcium 8.3 (L) 8.9 - 10.3 mg/dL   GFR, Estimated >60 >60 mL/min   Anion gap 6 5 - 15  Magnesium  Result Value Ref Range   Magnesium 1.7 1.7 - 2.4 mg/dL    Imaging:  I personally reviewed radiology studies to include:  CT C/A/P IMPRESSION: 1. No acute vascular findings. 2. 4.3 cm in diameter ascending thoracic aortic aneurysm. Recommend annual imaging followup by CTA or MRA. This recommendation follows 2010 ACCF/AHA/AATS/ACR/ASA/SCA/SCAI/SIR/STS/SVM Guidelines for the Diagnosis and Management of Patients with Thoracic Aortic Disease. Circulation. 2010; 121: Q492-E100. Aortic aneurysm NOS (ICD10-I71.9) 3. Generally mild degrees of atherosclerosis. Aortic Atherosclerosis (ICD10-I70.0). 4. Abnormal wall thickening and indistinct margins of the upper thoracic esophagus. The appearance is nonspecific, although esophagitis would be a potential cause. Correlate with any GI symptoms and patient history in determining whether follow up gastroenterology referral is warranted. 5. Other imaging findings of potential clinical significance: Duplicated infrarenal IVC. Sigmoid colon diverticulosis. 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 Multilevel lumbar impingement.     Electronically Signed   By: Van Clines M.D.   On: 06/27/2021 12:17   Impression/Plan:      1.  Diagnosis: back and interscapular pain  2.  Plan  - Pts low back pain without radiculopathy is consistent with his baseline pain which is currently under the treatment of a chiropractor. -His intrascapular pain is likely musculoskeletal due to his increased housework several days ago.  There may be some underlying cervical or upper  thoracic radiculopathy however he does not have any corresponding radiating arm pain. -Patient states that he did not get the MRIs earlier due to a bout of shaking but would like to have these rescheduled. -No plan for neurosurgical intervention at this time. - should MRIs show explanatory cause, and patient's symptoms fail to improve with conservative treatment, we will follow-up with him outpatient to discuss further treatment options. - Please call with any questions or concerns.   Cooper Render PA-C Neurosurgery

## 2021-06-29 NOTE — Progress Notes (Signed)
PHARMACY - PHYSICIAN COMMUNICATION CRITICAL VALUE ALERT - BLOOD CULTURE IDENTIFICATION (BCID)  Andrew Mullins is an 66 y.o. male who presented to [facility] on 06/28/2021 with a chief complaint of confusion and fever  Assessment:  Blood culture from 6/14 with GPC in anaerobic bottle of each set, BCID = Streptococcus species  Name of physician (or Provider) Contacted: Dr Reesa Chew  Current antibiotics: Ceftriaxone  Changes to prescribed antibiotics recommended:  Patient is on recommended antibiotics - No changes needed  Results for orders placed or performed during the hospital encounter of 06/28/21  Blood Culture ID Panel (Reflexed) (Collected: 06/28/2021 10:16 PM)  Result Value Ref Range   Enterococcus faecalis NOT DETECTED NOT DETECTED   Enterococcus Faecium NOT DETECTED NOT DETECTED   Listeria monocytogenes NOT DETECTED NOT DETECTED   Staphylococcus species NOT DETECTED NOT DETECTED   Staphylococcus aureus (BCID) NOT DETECTED NOT DETECTED   Staphylococcus epidermidis NOT DETECTED NOT DETECTED   Staphylococcus lugdunensis NOT DETECTED NOT DETECTED   Streptococcus species DETECTED (A) NOT DETECTED   Streptococcus agalactiae NOT DETECTED NOT DETECTED   Streptococcus pneumoniae NOT DETECTED NOT DETECTED   Streptococcus pyogenes NOT DETECTED NOT DETECTED   A.calcoaceticus-baumannii NOT DETECTED NOT DETECTED   Bacteroides fragilis NOT DETECTED NOT DETECTED   Enterobacterales NOT DETECTED NOT DETECTED   Enterobacter cloacae complex NOT DETECTED NOT DETECTED   Escherichia coli NOT DETECTED NOT DETECTED   Klebsiella aerogenes NOT DETECTED NOT DETECTED   Klebsiella oxytoca NOT DETECTED NOT DETECTED   Klebsiella pneumoniae NOT DETECTED NOT DETECTED   Proteus species NOT DETECTED NOT DETECTED   Salmonella species NOT DETECTED NOT DETECTED   Serratia marcescens NOT DETECTED NOT DETECTED   Haemophilus influenzae NOT DETECTED NOT DETECTED   Neisseria meningitidis NOT DETECTED NOT DETECTED    Pseudomonas aeruginosa NOT DETECTED NOT DETECTED   Stenotrophomonas maltophilia NOT DETECTED NOT DETECTED   Candida albicans NOT DETECTED NOT DETECTED   Candida auris NOT DETECTED NOT DETECTED   Candida glabrata NOT DETECTED NOT DETECTED   Candida krusei NOT DETECTED NOT DETECTED   Candida parapsilosis NOT DETECTED NOT DETECTED   Candida tropicalis NOT DETECTED NOT DETECTED   Cryptococcus neoformans/gattii NOT DETECTED NOT DETECTED    Doreene Eland, PharmD, BCPS, BCIDP Work Cell: [phone] 06/29/2021 2:29 PM

## 2021-06-29 NOTE — Assessment & Plan Note (Signed)
Little worsening of sodium to 126 this morning.  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

## 2021-06-29 NOTE — Assessment & Plan Note (Signed)
Blood pressure now elevated, initially home antihypertensives were held due to softer blood pressure. -Restart home losartan and metoprolol -Continue to monitor

## 2021-06-29 NOTE — Progress Notes (Addendum)
Progress Note   Patient: Andrew Mullins SFK:[id number] DOB: 1955/09/17 DOA: 06/28/2021     0 DOS: the patient was seen and examined on 06/29/2021   Brief hospital course: Taken from H&P.  Andrew Mullins is a 66 y.o. male with a known history of CVA with residual weakness, prostate CA, HTN, GERD< esophageal cancer  presents to the emergency department for evaluation of shaking chills and fever.  He was seen in our ER two days ago for symptoms of a pinched nerve in his back that started after doing work in the yard.  He was given muscle relaxant, lidocaine patch and discharged home.  He returns today with complaint of confusion and weakness. Has  been taking morphine 31m PO daily for help with back pain.    Patient denies  weakness, dizziness, chest pain, shortness of breath, N/V/C/D, abdominal pain, dysuria/frequency, changes in mental status.   ED course.  On arrival to ED he was febrile at 102, met sepsis criteria with fever, tachycardia and tachypnea.  Borderline soft blood pressure which improved with IV fluid.  Patient received broad-spectrum antibiotics per sepsis protocol in ED which include cefepime, Flagyl and vancomycin.  UA was positive for nitrites and leukocytes.  He was continued on ceftriaxone and admitted for sepsis secondary to UTI.  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  Preliminary blood cultures with Streptococcus species in anaerobic bottles.  Urine cultures were ordered as an add-on. Patient continued to experience significant back pain. Multilevel lumbar impingement was noted on CTA of chest, abdomen and pelvis done on 06/27/2021. Patient is now complaining of more pain between the shoulder blade, radiating to the both shoulders, no tingling and numbness or focal weakness. Ordered MRI thoracic and lumbar spine. Neurosurgery was consulted.  Off note: [facility]  CTA chest with some concern of  esophagitis.  Might have bacteremia secondary to esophageal dilatation. ID was also consulted.   Assessment and Plan: * Sepsis secondary to UTI ([facility] Patient met sepsis criteria with fever, tachycardia and tachypnea.  UA concerning for UTI.  Preliminary blood cultures with Streptococcus species and anaerobic bottles.  Patient is already on preferred antibiotic of ceftriaxone. Urine cultures ordered as add-on. -Continue ceftriaxone -Continue supportive care -Follow-up cultures  Back pain Patient was having significant upper back pain.  Also has an history of lower back pain.  CTA chest, abdomen and pelvis with some concern of nerve impingement at multiple lumbar levels. He was more concerned about recent pain between his shoulder blade, started few days ago, radiating to both shoulders, denies any associated tingling and numbness or weakness. -MRI of thoracic and lumbar spine -Neurology consult -Continue with pain management -Add a muscle relaxant  [place] Patient with slowly trending down platelets, at 79 today.  All cell lines decreased so there must be some dilutional effect. -Hold aspirin and Lovenox -Continue to monitor  Hypokalemia Sodium at 3.2, magnesium which was checked on 13 was low at 1.5, not sure whether it was repleted as it was checked during prior ED visit. -Check magnesium -Replete potassium  CVA (cerebral vascular accident) ([facility] Patient has an history of CVA.  No acute concern. -Continue home dose of Lipitor -Holding home aspirin due to thrombocytopenia  Essential hypertension Blood pressure now elevated, initially home antihypertensives were held due to softer blood pressure. -Restart home losartan and metoprolol -Continue to monitor  Hyponatremia Little worsening of sodium to 126 this morning.  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   History of multiple esophageal dilations in the past.  CTA chest with concern of esophagitis. Might have post dilatation bacteremia. -Continue with Protonix   Subjective: Patient was seen and examined today.  He was complaining of 10/10 pain between his shoulder blade.  Radiating to both shoulders.  Denies any associated arm weakness, tingling or numbness.  Patient also has an history of chronic lower back pain secondary to nerve impingements.  Physical Exam: Vitals:   06/29/21 0700 06/29/21 0900 06/29/21 1034 06/29/21 1140  BP: 120/63 (!) 127/59  (!) 151/81  Pulse: 81 83  (!) 110  Resp: 16 (!) 26  18  Temp:   98.7 F (37.1 C) 98.8 F (37.1 C)  TempSrc:   Oral   SpO2: 96% 96%  94%  Weight:      Height:       General.  Well-developed gentleman, appears in pain Pulmonary.  Lungs clear bilaterally, normal respiratory effort. CV.  Regular rate and rhythm, no JVD, rub or murmur. Abdomen.  Soft, nontender, nondistended, BS positive. CNS.  Alert and oriented .  No focal neurologic deficit. Extremities.  No edema, no cyanosis, pulses intact and symmetrical. Psychiatry.  Judgment and insight appears normal.  Data Reviewed: Prior notes, labs and images reviewed  Family Communication: Discussed with patient  Disposition: Status is: Inpatient Remains inpatient appropriate because: Severity of illness   Planned Discharge Destination: Home  DVT prophylaxis SCDs Time spent: 50 minutes  This record has been created using Systems analyst. Errors have been sought and corrected,but may not always be located. Such creation errors do not reflect on the standard of care.  Author: Lorella Nimrod, MD 06/29/2021 3:39 PM  For on call review [email or web address].

## 2021-06-29 NOTE — Assessment & Plan Note (Signed)
Patient met sepsis criteria with fever, tachycardia and tachypnea.  UA concerning for UTI.  Preliminary blood cultures with Streptococcus species and anaerobic bottles.  Patient is already on preferred antibiotic of ceftriaxone. Urine cultures ordered as add-on. -Continue ceftriaxone -Continue supportive care -Follow-up cultures

## 2021-06-29 NOTE — ED Notes (Signed)
Pt refusing MRI at current time. Provider notified.

## 2021-06-29 NOTE — Consult Note (Signed)
Neurosurgery-New Consultation Evaluation 06/29/2021 Andrew Mullins [id number]  Identifying Statement: Andrew Mullins is a 66 y.o. male from [place] [id number] with a history of CVA, prostate CA, HTN, GERD, Esophageal cancer, and chronic lumbosacral complaints   Physician Requesting Consultation: No ref. provider found  History of Present Illness: Andrew Mullins is a 66 y.o presenting to the ER initially on 06/27/21 for evaluation of SOB with associated upper back and interscapular pain.  He states that this started about a day after doing some concrete work.  He was initially seen by his chiropractor who recommended further evaluation in the ER for concern of a pinched nerve.  He was treated with IV pain medication, muscle relaxants, and a lidocaine patch, his pain improved and he was subsequently discharged home.  He presented back to the ER on 06/28/2021 with confusion and a fever.  He was subsequently admitted for further evaluation and found to meet sepsis criteria.  His blood pressures have improved with fluids and he was started on broad-spectrum antibiotics.  He was found to have a UA positive for nitrates and leukocytes. Today he reports his confusion is better.  He continues to have interscapular pain worse on the left without any radiating pain into his arms or torso.  He states that it is improved since onset but is still about a 6 out of 10.  He also endorses chronic lumbosacral complaints which he has been undergoing chiropractic treatment for.  He is not currently having any radiating leg pain.  Past Medical History:  Past Medical History:  Diagnosis Date   Cancer ([place])    esophageal   Complication of anesthesia    GERD (gastroesophageal reflux disease)    Hypertension    Prostate cancer ([place])    Stroke ([place]) 2020   no wealness    Social History: Social History   Socioeconomic History   Marital status: Married    Spouse name: Not on file   Number of children:  Not on file   Years of education: Not on file   Highest education level: Not on file  Occupational History   Not on file  Tobacco Use   Smoking status: Former   Smokeless tobacco: Never  Vaping Use   Vaping Use: Never used  Substance and Sexual Activity   Alcohol use: Not Currently   Drug use: Never   Sexual activity: Not on file  Other Topics Concern   Not on file  Social History Narrative   Lives at home with wife   Social Determinants of Health   Financial Resource Strain: Not on file  Food Insecurity: Not on file  Transportation Needs: Not on file  Physical Activity: Not on file  Stress: Not on file  Social Connections: Not on file  Intimate Partner Violence: Not on file   Living arrangements (living alone, with partner): wife  Family History: History reviewed. No pertinent family history.  Review of Systems:  Review of Systems - General ROS: Negative Psychological ROS: Negative Ophthalmic ROS: Negative ENT ROS: Negative Hematological and Lymphatic ROS: Negative  Endocrine ROS: Negative Respiratory ROS: Negative Cardiovascular ROS: Negative Gastrointestinal ROS: Negative Genito-Urinary ROS: Negative Musculoskeletal ROS: Negative Neurological ROS: Negative Dermatological ROS: Negative  Physical Exam: BP (!) 151/81 (BP Location: Left Arm)   Pulse (!) 110   Temp 98.8 F (37.1 C)   Resp 18   Ht 6' (1.829 m)   Wt 93 kg   SpO2 94%   BMI 27.80 kg/m  Body  mass index is 27.8 kg/m. Body surface area is 2.17 meters squared. General appearance: Alert, cooperative, in no acute distress Head: Normocephalic, atraumatic Eyes: Normal, EOM intact Oropharynx: Moist without lesions Neck: Supple, no tenderness Heart: Normal, regular rate and rhythm Lungs: good air exchange.  No obvious increased work of breathing Abdomen: Soft, nondistended Ext: No edema in LE bilaterally, good distal pulses  Neurologic exam:  Mental status: alertness: alert, orientation:  person, place, time, affect: normal Speech: fluent and clear Cranial nerves:  CN II-XII grossly intact  Motor:strength symmetric 5/5, normal muscle mass and tone in all extremities and no pronator drift Sensory: intact to light touch in all extremities Reflexes: 2+ and symmetric bilaterally for arms and legs Coordination: intact finger to nose Gait: untested   Laboratory: Results for orders placed or performed during the hospital encounter of 06/28/21  Blood Culture (routine x 2)   Specimen: BLOOD  Result Value Ref Range   Specimen Description BLOOD LEFT ARM    Special Requests      BOTTLES DRAWN AEROBIC AND ANAEROBIC Blood Culture results may not be optimal due to an inadequate volume of blood received in culture bottles   Culture  Setup Time      Organism ID to follow ANAEROBIC BOTTLE ONLY [place] TO, READ BACK BY AND VERIFIED WITH: [facility] MITCHELL 06/29/21 [place] Performed at [facility], [address] [place], [place] [address]    Culture GRAM POSITIVE COCCI    Report Status PENDING   Blood Culture (routine x 2)   Specimen: BLOOD  Result Value Ref Range   Specimen Description BLOOD LEFT ASSIST CONTROL    Special Requests      BOTTLES DRAWN AEROBIC AND ANAEROBIC Blood Culture results may not be optimal due to an inadequate volume of blood received in culture bottles   Culture  Setup Time      ANAEROBIC BOTTLE ONLY GRAM POSITIVE COCCI CRITICAL RESULT CALLED TO, READ BACK BY AND VERIFIED WITH: [facility] MITCHELL 06/29/21 [place] Performed at [facility], [address] [place], [place] [id number]    Culture GRAM POSITIVE COCCI    Report Status PENDING   Blood Culture ID Panel (Reflexed)  Result Value Ref Range   Enterococcus faecalis NOT DETECTED NOT DETECTED   Enterococcus Faecium NOT DETECTED NOT DETECTED   Listeria monocytogenes NOT DETECTED NOT DETECTED   Staphylococcus species NOT DETECTED NOT DETECTED    Staphylococcus aureus (BCID) NOT DETECTED NOT DETECTED   Staphylococcus epidermidis NOT DETECTED NOT DETECTED   Staphylococcus lugdunensis NOT DETECTED NOT DETECTED   Streptococcus species DETECTED (A) NOT DETECTED   Streptococcus agalactiae NOT DETECTED NOT DETECTED   Streptococcus pneumoniae NOT DETECTED NOT DETECTED   Streptococcus pyogenes NOT DETECTED NOT DETECTED   A.calcoaceticus-baumannii NOT DETECTED NOT DETECTED   Bacteroides fragilis NOT DETECTED NOT DETECTED   Enterobacterales NOT DETECTED NOT DETECTED   Enterobacter cloacae complex NOT DETECTED NOT DETECTED   Escherichia coli NOT DETECTED NOT DETECTED   Klebsiella aerogenes NOT DETECTED NOT DETECTED   Klebsiella oxytoca NOT DETECTED NOT DETECTED   Klebsiella pneumoniae NOT DETECTED NOT DETECTED   Proteus species NOT DETECTED NOT DETECTED   Salmonella species NOT DETECTED NOT DETECTED   Serratia marcescens NOT DETECTED NOT DETECTED   Haemophilus influenzae NOT DETECTED NOT DETECTED   Neisseria meningitidis NOT DETECTED NOT DETECTED   Pseudomonas aeruginosa NOT DETECTED NOT DETECTED   Stenotrophomonas maltophilia NOT DETECTED NOT DETECTED   Candida albicans  NOT DETECTED NOT DETECTED   Candida auris NOT DETECTED NOT DETECTED   Candida glabrata NOT DETECTED NOT DETECTED   Candida krusei NOT DETECTED NOT DETECTED   Candida parapsilosis NOT DETECTED NOT DETECTED   Candida tropicalis NOT DETECTED NOT DETECTED   Cryptococcus neoformans/gattii NOT DETECTED NOT DETECTED  Comprehensive metabolic panel  Result Value Ref Range   Sodium 126 (L) 135 - 145 mmol/L   Potassium 3.5 3.5 - 5.1 mmol/L   Chloride 94 (L) 98 - 111 mmol/L   CO2 23 22 - 32 mmol/L   Glucose, Bld 101 (H) 70 - 99 mg/dL   BUN 27 (H) 8 - 23 mg/dL   Creatinine, Ser 1.00 0.61 - 1.24 mg/dL   Calcium 8.2 (L) 8.9 - 10.3 mg/dL   Total Protein 6.3 (L) 6.5 - 8.1 g/dL   Albumin 2.8 (L) 3.5 - 5.0 g/dL   AST 36 15 - 41 U/L   ALT 19 0 - 44 U/L   Alkaline Phosphatase 57  38 - 126 U/L   Total Bilirubin 1.0 0.3 - 1.2 mg/dL   GFR, Estimated >60 >60 mL/min   Anion gap 9 5 - 15  CBC  Result Value Ref Range   WBC 5.6 4.0 - 10.5 K/uL   RBC 3.82 (L) 4.22 - 5.81 MIL/uL   Hemoglobin 10.7 (L) 13.0 - 17.0 g/dL   HCT 31.4 (L) 39.0 - 52.0 %   MCV 82.2 80.0 - 100.0 fL   MCH 28.0 26.0 - 34.0 pg   MCHC 34.1 30.0 - 36.0 g/dL   RDW 13.0 11.5 - 15.5 %   Platelets 84 (L) 150 - 400 K/uL   nRBC 0.0 0.0 - 0.2 %  Lactic acid, plasma  Result Value Ref Range   Lactic Acid, Venous 2.1 (HH) 0.5 - 1.9 mmol/L  Urinalysis, Complete w Microscopic  Result Value Ref Range   Color, Urine AMBER (A) YELLOW   APPearance CLOUDY (A) CLEAR   Specific Gravity, Urine 1.020 1.005 - 1.030   pH 5.0 5.0 - 8.0   Glucose, UA NEGATIVE NEGATIVE mg/dL   Hgb urine dipstick LARGE (A) NEGATIVE   Bilirubin Urine NEGATIVE NEGATIVE   Ketones, ur NEGATIVE NEGATIVE mg/dL   Protein, ur 100 (A) NEGATIVE mg/dL   Nitrite POSITIVE (A) NEGATIVE   Leukocytes,Ua MODERATE (A) NEGATIVE   RBC / HPF 21-50 0 - 5 RBC/hpf   WBC, UA >50 (H) 0 - 5 WBC/hpf   Bacteria, UA MANY (A) NONE SEEN   Squamous Epithelial / LPF 0-5 0 - 5   WBC Clumps PRESENT    Mucus PRESENT    Hyaline Casts, UA PRESENT   Lactic acid, plasma  Result Value Ref Range   Lactic Acid, Venous 1.4 0.5 - 1.9 mmol/L  HIV Antibody (routine testing w rflx)  Result Value Ref Range   HIV Screen 4th Generation wRfx Non Reactive Non Reactive  Protime-INR  Result Value Ref Range   Prothrombin Time 15.7 (H) 11.4 - 15.2 seconds   INR 1.3 (H) 0.8 - 1.2  APTT  Result Value Ref Range   aPTT 44 (H) 24 - 36 seconds  CBC  Result Value Ref Range   WBC 7.0 4.0 - 10.5 K/uL   RBC 3.78 (L) 4.22 - 5.81 MIL/uL   Hemoglobin 10.6 (L) 13.0 - 17.0 g/dL   HCT 31.0 (L) 39.0 - 52.0 %   MCV 82.0 80.0 - 100.0 fL   MCH 28.0 26.0 - 34.0 pg   MCHC 34.2  30.0 - 36.0 g/dL   RDW 13.1 11.5 - 15.5 %   Platelets 79 (L) 150 - 400 K/uL   nRBC 0.0 0.0 - 0.2 %  Basic metabolic  panel  Result Value Ref Range   Sodium 129 (L) 135 - 145 mmol/L   Potassium 3.2 (L) 3.5 - 5.1 mmol/L   Chloride 99 98 - 111 mmol/L   CO2 24 22 - 32 mmol/L   Glucose, Bld 155 (H) 70 - 99 mg/dL   BUN 21 8 - 23 mg/dL   Creatinine, Ser 0.78 0.61 - 1.24 mg/dL   Calcium 8.3 (L) 8.9 - 10.3 mg/dL   GFR, Estimated >60 >60 mL/min   Anion gap 6 5 - 15  Magnesium  Result Value Ref Range   Magnesium 1.7 1.7 - 2.4 mg/dL    Imaging:  I personally reviewed radiology studies to include:  CT C/A/P IMPRESSION: 1. No acute vascular findings. 2. 4.3 cm in diameter ascending thoracic aortic aneurysm. Recommend annual imaging followup by CTA or MRA. This recommendation follows 2010 ACCF/AHA/AATS/ACR/ASA/SCA/SCAI/SIR/STS/SVM Guidelines for the Diagnosis and Management of Patients with Thoracic Aortic Disease. Circulation. 2010; 121: S341-D622. Aortic aneurysm NOS (ICD10-I71.9) 3. Generally mild degrees of atherosclerosis. Aortic Atherosclerosis (ICD10-I70.0). 4. Abnormal wall thickening and indistinct margins of the upper thoracic esophagus. The appearance is nonspecific, although esophagitis would be a potential cause. Correlate with any GI symptoms and patient history in determining whether follow up gastroenterology referral is warranted. 5. Other imaging findings of potential clinical significance: Duplicated infrarenal IVC. Sigmoid colon diverticulosis. 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 Multilevel lumbar impingement.     Electronically Signed   By: Van Clines M.D.   On: 06/27/2021 12:17   Impression/Plan:      1.  Diagnosis: back and interscapular pain  2.  Plan  - Pts low back pain without radiculopathy is consistent with his baseline pain which is currently under the treatment of a chiropractor. -His intrascapular pain is likely musculoskeletal due to his increased housework several days ago.  There may be some underlying cervical or upper  thoracic radiculopathy however he does not have any corresponding radiating arm pain. -Patient states that he did not get the MRIs earlier due to a bout of shaking but would like to have these rescheduled. -No plan for neurosurgical intervention at this time. - should MRIs show explanatory cause, and patient's symptoms fail to improve with conservative treatment, we will follow-up with him outpatient to discuss further treatment options. - Please call with any questions or concerns.   Cooper Render PA-C Neurosurgery

## 2021-06-29 NOTE — Assessment & Plan Note (Signed)
Sodium at 3.2, magnesium which was checked on 13 was low at 1.5, not sure whether it was repleted as it was checked during prior ED visit. -Check magnesium -Replete potassium
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
## 2021-06-29 NOTE — Assessment & Plan Note (Signed)
Patient was having significant upper back pain.  Also has an history of lower back pain.  CTA chest, abdomen and pelvis with some concern of nerve impingement at multiple lumbar levels. He was more concerned about recent pain between his shoulder blade, started few days ago, radiating to both shoulders, denies any associated tingling and numbness or weakness. -MRI of thoracic and lumbar spine -Neurology consult -Continue with pain management -Add a muscle relaxant

## 2021-06-29 NOTE — Assessment & Plan Note (Signed)
Patient has a recent esophageal dilatation at [facility].  History of multiple esophageal dilations in the past.  CTA chest with concern of esophagitis. Might have post dilatation bacteremia. -Continue with Protonix

## 2021-06-29 NOTE — Assessment & Plan Note (Signed)
Patient with slowly trending down platelets, at 79 today.  All cell lines decreased so there must be some dilutional effect. -Hold aspirin and Lovenox -Continue to monitor

## 2021-06-30 ENCOUNTER — Inpatient Hospital Stay: Payer: Medicare Other

## 2021-06-30 DIAGNOSIS — N390 Urinary tract infection, site not specified: Secondary | ICD-10-CM | POA: Diagnosis present

## 2021-06-30 DIAGNOSIS — M4620 Osteomyelitis of vertebra, site unspecified: Secondary | ICD-10-CM | POA: Diagnosis not present

## 2021-06-30 DIAGNOSIS — B9620 Unspecified Escherichia coli [E. coli] as the cause of diseases classified elsewhere: Secondary | ICD-10-CM | POA: Diagnosis present

## 2021-06-30 DIAGNOSIS — R7881 Bacteremia: Secondary | ICD-10-CM | POA: Diagnosis not present

## 2021-06-30 DIAGNOSIS — A419 Sepsis, unspecified organism: Secondary | ICD-10-CM | POA: Diagnosis not present

## 2021-06-30 DIAGNOSIS — M4644 Discitis, unspecified, thoracic region: Secondary | ICD-10-CM | POA: Diagnosis not present

## 2021-06-30 DIAGNOSIS — B955 Unspecified streptococcus as the cause of diseases classified elsewhere: Secondary | ICD-10-CM | POA: Diagnosis not present

## 2021-06-30 LAB — BASIC METABOLIC PANEL
Anion gap: 6 (ref 5–15)
BUN: 19 mg/dL (ref 8–23)
CO2: 26 mmol/L (ref 22–32)
Calcium: 8.2 mg/dL — ABNORMAL LOW (ref 8.9–10.3)
Chloride: 96 mmol/L — ABNORMAL LOW (ref 98–111)
Creatinine, Ser: 0.68 mg/dL (ref 0.61–1.24)
GFR, Estimated: >60 mL/min (ref >60)
Glucose, Bld: 110 mg/dL — ABNORMAL HIGH (ref 70–99)
Potassium: 3.3 mmol/L — ABNORMAL LOW (ref 3.5–5.1)
Sodium: 128 mmol/L — ABNORMAL LOW (ref 135–145)

## 2021-06-30 LAB — CBC
HCT: 30.2 % — ABNORMAL LOW (ref 39.0–52.0)
Hemoglobin: 10.1 g/dL — ABNORMAL LOW (ref 13.0–17.0)
MCH: 27.7 pg (ref 26.0–34.0)
MCHC: 33.4 g/dL (ref 30.0–36.0)
MCV: 82.7 fL (ref 80.0–100.0)
Platelets: 100 10*3/uL — ABNORMAL LOW (ref 150–400)
RBC: 3.65 MIL/uL — ABNORMAL LOW (ref 4.22–5.81)
RDW: 13.1 % (ref 11.5–15.5)
WBC: 6.1 10*3/uL (ref 4.0–10.5)
nRBC: 0 % (ref 0.0–0.2)

## 2021-06-30 LAB — PROCALCITONIN: Procalcitonin: 34.99 ng/mL

## 2021-06-30 MED ORDER — MAGNESIUM SULFATE 2 GM/50ML IV SOLN
2.0000 g | Freq: Once | INTRAVENOUS | Status: AC
  Administered 2021-06-30: 2 g via INTRAVENOUS
  Filled 2021-06-30: qty 50

## 2021-06-30 MED ORDER — POTASSIUM CHLORIDE CRYS ER 20 MEQ PO TBCR
40.0000 meq | EXTENDED_RELEASE_TABLET | Freq: Once | ORAL | Status: AC
  Administered 2021-06-30: 40 meq via ORAL
  Filled 2021-06-30: qty 2

## 2021-06-30 MED ORDER — SODIUM CHLORIDE 0.9 % IV SOLN
2.0000 g | INTRAVENOUS | Status: DC
  Administered 2021-06-30 – 2021-07-05 (×6): 2 g via INTRAVENOUS
  Filled 2021-06-30: qty 2
  Filled 2021-06-30 (×3): qty 20
  Filled 2021-06-30 (×2): qty 2
  Filled 2021-06-30: qty 20

## 2021-06-30 MED ORDER — MORPHINE SULFATE ER 30 MG PO TBCR
30.0000 mg | EXTENDED_RELEASE_TABLET | Freq: Two times a day (BID) | ORAL | Status: DC
  Administered 2021-06-30 – 2021-07-04 (×8): 30 mg via ORAL
  Filled 2021-06-30 (×8): qty 1

## 2021-06-30 MED ORDER — GADOBUTROL 1 MMOL/ML IV SOLN
7.0000 mL | Freq: Once | INTRAVENOUS | Status: AC | PRN
Start: 2021-06-30 — End: 2021-06-30
  Administered 2021-06-30: 7 mL via INTRAVENOUS

## 2021-06-30 NOTE — Assessment & Plan Note (Signed)
Some improvement of sodium to 128.  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

## 2021-06-30 NOTE — Progress Notes (Signed)
Progress Note   Patient: Andrew Mullins CLE:[id number] DOB: 02-05-1955 DOA: 06/28/2021     1 DOS: the patient was seen and examined on 06/30/2021   Brief hospital course: Taken from H&P.  Horacio Werth is a 66 y.o. male with a known history of CVA with residual weakness, prostate CA, HTN, GERD< esophageal cancer  presents to the emergency department for evaluation of shaking chills and fever.  He was seen in our ER two days ago for symptoms of a pinched nerve in his back that started after doing work in the yard.  He was given muscle relaxant, lidocaine patch and discharged home.  He returns today with complaint of confusion and weakness. Has  been taking morphine 29m PO daily for help with back pain.    Patient denies  weakness, dizziness, chest pain, shortness of breath, N/V/C/D, abdominal pain, dysuria/frequency, changes in mental status.   ED course.  On arrival to ED he was febrile at 102, met sepsis criteria with fever, tachycardia and tachypnea.  Borderline soft blood pressure which improved with IV fluid.  Patient received broad-spectrum antibiotics per sepsis protocol in ED which include cefepime, Flagyl and vancomycin.  UA was positive for nitrites and leukocytes.  He was continued on ceftriaxone and admitted for sepsis secondary to UTI.  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  Preliminary blood cultures with Streptococcus species in anaerobic bottles.  Urine cultures were ordered as an add-on. Patient continued to experience significant back pain. Multilevel lumbar impingement was noted on CTA of chest, abdomen and pelvis done on 06/27/2021. Patient is now complaining of more pain between the shoulder blade, radiating to the both shoulders, no tingling and numbness or focal weakness. Ordered MRI thoracic and lumbar spine. Neurosurgery was consulted.  Off note: [facility]  CTA chest with some concern of  esophagitis.  Might have bacteremia secondary to esophageal dilatation. ID was also consulted.  6/16: All blood cultures growing Streptococcus, most likely strep viridans secondary to recent esophageal dilatation.  Preliminary urine cultures with E. Coli.  ID is on board.  MRI of thoracic spine with concern of discitis and abscess starting from T1, recommending cervical MRI which was ordered but will not be able to obtain it until 24-hour before the prior images.  Procalcitonin markedly elevated.  Echocardiogram ordered. Neurosurgery to determine whether they can drain that epidural abscess, recommending conservative management at this time. Echocardiogram ordered-Will avoid TEE due to his history of esophageal disease. Repeat blood cultures on Sunday-if remain negative for 48 hours then PICC line will be placed Continue to have significant upper back pain. GI was also consulted to rule out perforation although prior imaging was negative, Dr. AVicente Maleswill see the patient by Monday.  Patient does not want to see any other gastroenterologist.   Assessment and Plan: * Sepsis with Streptococcus bacteremia Patient met sepsis criteria with fever, tachycardia and tachypnea.  UA concerning for UTI.  Preliminary blood cultures with Streptococcus species, most likely strep viridans with his history of recent esophageal dilatation.  Patient is already on preferred antibiotic of ceftriaxone.  Urine cultures with E. coli -Continue ceftriaxone -Continue supportive care -Follow-up cultures -Repeat blood cultures on Sunday and after 48 hours of negative cultures PICC line will be placed for at least 6 weeks of antibiotics -Echocardiogram ordered-we will avoid TEE due to history of esophageal cancer and multiple strictures requiring dilatations.  E. coli UTI Urine cultures  growing E. Coli.  Patient is already on ceftriaxone for Streptococcus bacteremia. -Continue ceftriaxone -Follow final culture  results  Discitis/osteomyelitis Patient with significant upper back pain with history of chronic lower back pain. MRI of thoracic and lumbar spine with concerning of discitis/osteomyelitis with some abscess involving T1-T5, recommending cervical MRI to see the extent. Neurosurgery is recommending conservative management. -Continue with ceftriaxone -We will repeat blood cultures on Sunday. -Follow-up final culture results -Continue with pain management-MS Contin was added with Dilaudid to decrease the need of as needed meds.  Thrombocytopenia (HCC) Platelet with some improvement to 100. -Keep holding aspirin and Lovenox, we will restart if continue to improve -Continue to monitor  Hypokalemia Potassium still at 3.3 with magnesium of 1.7. -Replete potassium and magnesium -Continue to monitor  CVA (cerebral vascular accident) [place]) Patient has an history of CVA.  No acute concern. -Continue home dose of Lipitor -Holding home aspirin due to thrombocytopenia  Essential hypertension Blood pressure mildly elevated, most likely pain is playing a role. -Continue home losartan and metoprolol -Continue to monitor  Hyponatremia Some improvement of sodium to 128.  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  History of multiple esophageal dilations in the past.  CTA chest with concern of esophagitis. Might have post dilatation bacteremia. -Continue with Protonix  Subjective: Patient was still having a lot of upper back pain.  Wife at bedside.  Physical Exam: Vitals:   06/29/21 2231 06/30/21 0420 06/30/21 0750 06/30/21 1154  BP: 117/69 117/68 (!) 148/84 (!) 149/83  Pulse: 85 72 80 73  Resp: _0 Temp: 98.4 F (36.9 C) 98 F (36.7 C) 98.2 F (36.8 C) 98.1 F (36.7 C)  TempSrc:  Oral  Oral  SpO2: 94% 94% 97% 98%  Weight:      Height:       General.  Well-developed gentleman, in no  acute distress. Pulmonary.  Lungs clear bilaterally, normal respiratory effort. CV.  Regular rate and rhythm, no JVD, rub or murmur. Abdomen.  Soft, nontender, nondistended, BS positive. CNS.  Alert and oriented .  No focal neurologic deficit. Extremities.  No edema, no cyanosis, pulses intact and symmetrical. Psychiatry.  Judgment and insight appears normal.  Data Reviewed: Prior notes, labs and images reviewed  Family Communication: Discussed with wife at bedside  Disposition: Status is: Inpatient Remains inpatient appropriate because: Severity of illness   Planned Discharge Destination: Home with Home Health  Time spent: 50 minutes  This record has been created using Systems analyst. Errors have been sought and corrected,but may not always be located. Such creation errors do not reflect on the standard of care.  Author: Lorella Nimrod, MD 06/30/2021 2:11 PM  For on call review [email or web address].

## 2021-06-30 NOTE — Assessment & Plan Note (Signed)
Patient with significant upper back pain with history of chronic lower back pain. MRI of thoracic and lumbar spine with concerning of discitis/osteomyelitis with some abscess involving T1-T5, recommending cervical MRI to see the extent. Neurosurgery is recommending conservative management. -Continue with ceftriaxone -We will repeat blood cultures on Sunday. -Follow-up final culture results -Continue with pain management-MS Contin was added with Dilaudid to decrease the need of as needed meds.

## 2021-06-30 NOTE — Assessment & Plan Note (Signed)
Urine cultures growing E. Coli.  Patient is already on ceftriaxone for Streptococcus bacteremia. -Continue ceftriaxone -Follow final culture results

## 2021-06-30 NOTE — TOC Initial Note (Signed)
Transition of Care [facility]) - Initial/Assessment Note    Patient Details  Name: Andrew Mullins MRN: [id number] Date of Birth: July 07, 1955  Transition of Care [facility]) CM/SW Contact:    Conception Oms, RN Phone Number: 06/30/2021, 8:19 AM  Clinical Narrative:                 Patient from home with his wife, has Medicare,  PCP Harrel Lemon, Pharmacy [facility], Active at home  Transition of Care [facility]) Screening Note   Patient Details  Name: Andrew Mullins Date of Birth: 04-22-1955   Transition of Care [facility]) CM/SW Contact:    Conception Oms, RN Phone Number: 06/30/2021, 8:20 AM    Transition of Care Department [facility]) has reviewed patient and no TOC needs have been identified at this time. We will continue to monitor patient advancement through interdisciplinary progression rounds. If new patient transition needs arise, please place a TOC consult.    Expected Discharge Plan: Home/Self Care Barriers to Discharge: Continued Medical Work up   Patient Goals and CMS Choice        Expected Discharge Plan and Services Expected Discharge Plan: Home/Self Care   Discharge Planning Services: CM Consult   Living arrangements for the past 2 months: Single Family Home                                      Prior Living Arrangements/Services Living arrangements for the past 2 months: Single Family Home Lives with:: Spouse                   Activities of Daily Living Home Assistive Devices/Equipment: None ADL Screening (condition at time of admission) Patient's cognitive ability adequate to safely complete daily activities?: Yes Is the patient deaf or have difficulty hearing?: No Does the patient have difficulty seeing, even when wearing glasses/contacts?: No Does the patient have difficulty concentrating, remembering, or making decisions?: No Patient able to express need for assistance with ADLs?: Yes Does the patient have difficulty dressing or  bathing?: No Independently performs ADLs?: Yes (appropriate for developmental age) Does the patient have difficulty walking or climbing stairs?: No Weakness of Legs: None Weakness of Arms/Hands: None  Permission Sought/Granted                  Emotional Assessment              Admission diagnosis:  Sepsis secondary to UTI ([place]) [A41.9, N39.0] Patient Active Problem List   Diagnosis Date Noted   Sepsis secondary to UTI ([place]) 06/29/2021   Essential hypertension 06/29/2021   Thrombocytopenia ([place]) 06/29/2021   Hypokalemia 06/29/2021   Hyponatremia 06/29/2021   Back pain 06/29/2021   Esophageal stricture 02/16/2020   Dysphagia 02/16/2020   CVA (cerebral vascular accident) ([place]) 09/17/2018   PCP:  Baxter Hire, MD Pharmacy:   [facility] [id number], [place] [address] [place] [address] Phone: [phone] Fax: [phone]     Social Determinants of Health (SDOH) Interventions    Readmission Risk Interventions     No data to display

## 2021-06-30 NOTE — Assessment & Plan Note (Signed)
Blood pressure mildly elevated, most likely pain is playing a role. -Continue home losartan and metoprolol -Continue to monitor

## 2021-06-30 NOTE — Assessment & Plan Note (Addendum)
Patient met sepsis criteria with fever, tachycardia and tachypnea.  UA concerning for UTI.  Preliminary blood cultures with Streptococcus species, most likely strep viridans with his history of recent esophageal dilatation.  Patient is already on preferred antibiotic of ceftriaxone.  Urine cultures with E. coli -Continue ceftriaxone -Continue supportive care -Follow-up cultures -Repeat blood cultures on Sunday and after 48 hours of negative cultures PICC line will be placed for at least 6 weeks of antibiotics -Echocardiogram ordered-we will avoid TEE due to history of esophageal cancer and multiple strictures requiring dilatations.

## 2021-06-30 NOTE — Assessment & Plan Note (Signed)
Platelet with some improvement to 100. -Keep holding aspirin and Lovenox, we will restart if continue to improve -Continue to monitor
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
## 2021-06-30 NOTE — Assessment & Plan Note (Signed)
Patient has an history of CVA.  No acute concern. -Continue home dose of Lipitor -Holding home aspirin due to thrombocytopenia

## 2021-06-30 NOTE — Assessment & Plan Note (Signed)
Potassium still at 3.3 with magnesium of 1.7. -Replete potassium and magnesium -Continue to monitor

## 2021-07-01 ENCOUNTER — Inpatient Hospital Stay
Admit: 2021-07-01 | Discharge: 2021-07-01 | Disposition: A | Discharge status: Home / Self Care | Payer: Medicare Other | Attending: Internal Medicine | Admitting: Internal Medicine

## 2021-07-01 ENCOUNTER — Encounter: Payer: Self-pay | Admitting: Radiology

## 2021-07-01 ENCOUNTER — Inpatient Hospital Stay: Payer: Medicare Other

## 2021-07-01 DIAGNOSIS — N390 Urinary tract infection, site not specified: Secondary | ICD-10-CM | POA: Diagnosis not present

## 2021-07-01 DIAGNOSIS — A419 Sepsis, unspecified organism: Secondary | ICD-10-CM | POA: Diagnosis not present

## 2021-07-01 LAB — MAGNESIUM: Magnesium: 2 mg/dL (ref 1.7–2.4)

## 2021-07-01 LAB — CBC
HCT: 34.8 % — ABNORMAL LOW (ref 39.0–52.0)
Hemoglobin: 11.7 g/dL — ABNORMAL LOW (ref 13.0–17.0)
MCH: 27.5 pg (ref 26.0–34.0)
MCHC: 33.6 g/dL (ref 30.0–36.0)
MCV: 81.7 fL (ref 80.0–100.0)
Platelets: 95 10*3/uL — ABNORMAL LOW (ref 150–400)
RBC: 4.26 MIL/uL (ref 4.22–5.81)
RDW: 13.1 % (ref 11.5–15.5)
WBC: 7.2 10*3/uL (ref 4.0–10.5)
nRBC: 0 % (ref 0.0–0.2)

## 2021-07-01 LAB — BASIC METABOLIC PANEL
Anion gap: 8 (ref 5–15)
BUN: 14 mg/dL (ref 8–23)
CO2: 27 mmol/L (ref 22–32)
Calcium: 8.4 mg/dL — ABNORMAL LOW (ref 8.9–10.3)
Chloride: 92 mmol/L — ABNORMAL LOW (ref 98–111)
Creatinine, Ser: 0.62 mg/dL (ref 0.61–1.24)
GFR, Estimated: >60 mL/min (ref >60)
Glucose, Bld: 158 mg/dL — ABNORMAL HIGH (ref 70–99)
Potassium: 3.4 mmol/L — ABNORMAL LOW (ref 3.5–5.1)
Sodium: 127 mmol/L — ABNORMAL LOW (ref 135–145)

## 2021-07-01 LAB — PROCALCITONIN: Procalcitonin: 15.81 ng/mL

## 2021-07-01 LAB — ECHOCARDIOGRAM COMPLETE
AR max vel: 2.85 cm2
AV Peak grad: 9.2 mmHg
Ao pk vel: 1.52 m/s
Area-P 1/2: 2.43 cm2
Calc EF: 60.8 %
Height: 72 in
S' Lateral: 3.18 cm
Single Plane A2C EF: 62.2 %
Single Plane A4C EF: 57.5 %
Weight: 3280 oz

## 2021-07-01 LAB — CULTURE, BLOOD (ROUTINE X 2)

## 2021-07-01 LAB — URINE CULTURE: Culture: >=100000 — AB

## 2021-07-01 MED ORDER — GADOBUTROL 1 MMOL/ML IV SOLN
10.0000 mL | Freq: Once | INTRAVENOUS | Status: AC | PRN
  Administered 2021-07-01: 10 mL via INTRAVENOUS

## 2021-07-01 NOTE — Assessment & Plan Note (Signed)
Of patient with significant upper back pain with history of chronic lower back pain. MRI of thoracic and lumbar spine with concerning of discitis/osteomyelitis with some abscess involving T1-T5, cervical spine MRI desiccation with no osteomyelitis involving the cervical spine but extension of phlegmon/abscess up to C4-5.  Talked with neurosurgery again today due to persistent pain and fever they might take him to the OR tomorrow for washout. -Continue with ceftriaxone -We will repeat blood cultures on Sunday. -Follow-up final culture results -Continue with pain management-MS Contin was added with Dilaudid to decrease the need of as needed meds.

## 2021-07-01 NOTE — Assessment & Plan Note (Signed)
Some improvement of sodium to 128.  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

## 2021-07-01 NOTE — Assessment & Plan Note (Signed)
Platelets pretty much stable with some variations. -Keep holding aspirin and Lovenox, we will restart if continue to improve -Continue to monitor

## 2021-07-01 NOTE — Progress Notes (Signed)
Progress Note   Patient: NOHA KARASIK KHT:[id number] DOB: [birth year] DOA: 06/28/2021     2 DOS: the patient was seen and examined on 07/01/2021   Brief hospital course: Taken from H&P.  Ethan Clayburn is a 66 y.o. male with a known history of CVA with residual weakness, prostate CA, HTN, GERD< esophageal cancer  presents to the emergency department for evaluation of shaking chills and fever.  He was seen in our ER two days ago for symptoms of a pinched nerve in his back that started after doing work in the yard.  He was given muscle relaxant, lidocaine patch and discharged home.  He returns today with complaint of confusion and weakness. Has  been taking morphine $RemoveBeforeD'18mg'hFrokfiSBmbEMJ$  PO daily for help with back pain.    Patient denies  weakness, dizziness, chest pain, shortness of breath, N/V/C/D, abdominal pain, dysuria/frequency, changes in mental status.   ED course.  On arrival to ED he was febrile at 102, met sepsis criteria with fever, tachycardia and tachypnea.  Borderline soft blood pressure which improved with IV fluid.  Patient received broad-spectrum antibiotics per sepsis protocol in ED which include cefepime, Flagyl and vancomycin.  UA was positive for nitrites and leukocytes.  He was continued on ceftriaxone and admitted for sepsis secondary to UTI.  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  Preliminary blood cultures with Streptococcus species in anaerobic bottles.  Urine cultures were ordered as an add-on. Patient continued to experience significant back pain. Multilevel lumbar impingement was noted on CTA of chest, abdomen and pelvis done on 06/27/2021. Patient is now complaining of more pain between the shoulder blade, radiating to the both shoulders, no tingling and numbness or focal weakness. Ordered MRI thoracic and lumbar spine. Neurosurgery was consulted.  Off note: Patient has esophageal dilatation at [facility] 2 weeks ago.  CTA chest with some concern of  esophagitis.  Might have bacteremia secondary to esophageal dilatation. ID was also consulted.  6/16: All blood cultures growing Streptococcus, most likely strep viridans secondary to recent esophageal dilatation.  Preliminary urine cultures with E. Coli.  ID is on board.  MRI of thoracic spine with concern of discitis and abscess starting from T1, recommending cervical MRI which was ordered but will not be able to obtain it until 24-hour before the prior images.  Procalcitonin markedly elevated.  Echocardiogram ordered. Neurosurgery to determine whether they can drain that epidural abscess, recommending conservative management at this time. Echocardiogram ordered-Will avoid TEE due to his history of esophageal disease. Repeat blood cultures on Sunday-if remain negative for 48 hours then PICC line will be placed Continue to have significant upper back pain. GI was also consulted to rule out perforation although prior imaging was negative, Dr. Vicente Males will see the patient by Monday.  Patient does not want to see any other gastroenterologist.  6/17: Patient developed another febrile episode at 102 this morning, procalcitonin improving at 15.81 today.  Rest of the labs seems stable with mild hyponatremia and hypokalemia.  Repeat urine cultures with E. coli, only resistant to ampicillin and sulbactam.  Blood cultures with Streptococcus anginosus-pending susceptibility. MRI cervical spine done this morning with osteomyelitis discitis involving T1-T2, extension of phlegmon/abscess up to C4-C5 level.  Associated moderate spinal stenosis. Continues to have 10 out of 10 pain which interferes with all the activities and sleep.  Added MS Contin does not seem working at this time.  He was using all of his Dilaudid. Talked with Dr. Cari Caraway  and they might take him to the OR for washout tomorrow if he remained febrile and continued to have pain.  Patient was made n.p.o. after midnight   Assessment and Plan: *  Sepsis with Streptococcus bacteremia Patient met sepsis criteria with fever, tachycardia and tachypnea.  UA concerning for UTI.  Preliminary blood cultures with Streptococcus anginosus, please evaluate bedside history of recent esophageal dilatation.  Patient is already on preferred antibiotic of ceftriaxone.  Urine cultures with E. Coli Remained febrile with significant pain.  He is -Continue ceftriaxone -Continue supportive care -Follow-up cultures -Repeat blood cultures on Sunday and after 48 hours of negative cultures PICC line will be placed for at least 6 weeks of antibiotics -Echocardiogram ordered- pending.  We will avoid TEE due to history of esophageal cancer and multiple strictures requiring dilatations.  E. coli UTI Urine cultures growing E. Coli.  Patient is already on ceftriaxone for Streptococcus bacteremia.  Only resistant to ampicillin and sulbactam. -Continue ceftriaxone   Discitis/osteomyelitis Of patient with significant upper back pain with history of chronic lower back pain. MRI of thoracic and lumbar spine with concerning of discitis/osteomyelitis with some abscess involving T1-T5, cervical spine MRI desiccation with no osteomyelitis involving the cervical spine but extension of phlegmon/abscess up to C4-5.  Talked with neurosurgery again today due to persistent pain and fever they might take him to the OR tomorrow for washout. -Continue with ceftriaxone -We will repeat blood cultures on Sunday. -Follow-up final culture results -Continue with pain management-MS Contin was added with Dilaudid to decrease the need of as needed meds.  Thrombocytopenia ([place]) Platelets pretty much stable with some variations. -Keep holding aspirin and Lovenox, we will restart if continue to improve -Continue to monitor  Hypokalemia Potassium still at 3.4 with magnesium of 2.0. -Replete potassium  -Continue to monitor  CVA (cerebral vascular accident) [facility]) Patient has an history of  CVA.  No acute concern. -Continue home dose of Lipitor -Holding home aspirin due to thrombocytopenia  Essential hypertension Blood pressure mildly elevated, most likely pain is playing a role. -Continue home losartan and metoprolol -Continue to monitor  Hyponatremia Some improvement of sodium to 128.  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  History of multiple esophageal dilations in the past.  CTA chest with concern of esophagitis. Might have post dilatation bacteremia. -Continue with Protonix   Subjective: Patient was seen and examined today.  Continued to have 10 out of 10 lower neck and upper back pain.  He was having profuse sweating after receiving Tylenol for fever of 102.  Wife at bedside.  Physical Exam: Vitals:   07/01/21 0942 07/01/21 1039 07/01/21 1044 07/01/21 1211  BP:   100/65 (!) 96/53  Pulse:   74 63  Resp:   20 18  Temp: (!) 100.6 F (38.1 C) 98.2 F (36.8 C) 98.2 F (36.8 C) 97.8 F (36.6 C)  TempSrc: Oral Oral    SpO2:   90% 90%  Weight:      Height:       General.  Well-developed gentleman, appears in pain. Pulmonary.  Lungs clear bilaterally, normal respiratory effort. CV.  Regular rate and rhythm, no JVD, rub or murmur. Abdomen.  Soft, nontender, nondistended, BS positive. CNS.  Alert and oriented .  No focal neurologic deficit. Extremities.  No edema, no cyanosis, pulses intact and symmetrical. Psychiatry.  Judgment and insight appears normal.  Data Reviewed: Prior notes, labs and  images reviewed  Family Communication: Discussed with wife at bedside  Disposition: Status is: Inpatient Remains inpatient appropriate because: Severity of illness   Planned Discharge Destination: Home with Home Health  Time spent: 50 minutes  This record has been created using Systems analyst. Errors have been sought and corrected,but may not always be  located. Such creation errors do not reflect on the standard of care.  Author: Lorella Nimrod, MD 07/01/2021 1:31 PM  For on call review [email or web address].

## 2021-07-01 NOTE — Progress Notes (Signed)
   07/01/21 0809  Assess: MEWS Score  Temp (!) 102.4 F (39.1 C)  BP (!) 170/74  MAP (mmHg) 100  Pulse Rate 100  Resp 18  SpO2 91 %  Assess: MEWS Score  MEWS Temp 2  MEWS Systolic 0  MEWS Pulse 0  MEWS RR 0  MEWS LOC 0  MEWS Score 2  MEWS Score Color Yellow  Assess: if the MEWS score is Yellow or Red  Were vital signs taken at a resting state? Yes  Focused Assessment Change from prior assessment (see assessment flowsheet)  Does the patient meet 2 or more of the SIRS criteria? No  MEWS guidelines implemented *See Row Information* Yes  Treat  MEWS Interventions Escalated (See documentation below)  Take Vital Signs  Increase Vital Sign Frequency  Yellow: Q 2hr X 2 then Q 4hr X 2, if remains yellow, continue Q 4hrs  Escalate  MEWS: Escalate Yellow: discuss with charge nurse/RN and consider discussing with provider and RRT  Notify: Charge Nurse/RN  Name of Charge Nurse/RN Notified Estill Bamberg RN  Date Charge Nurse/RN Notified 07/01/21  Time Charge Nurse/RN Notified 4540  Notify: Provider  Provider Name/Title Dr Reesa Chew  Date Provider Notified 07/01/21  Time Provider Notified [phone]  Notification Reason Change in status  Provider response No new orders  Date of Provider Response 07/01/21  Time of Provider Response 0812  Assess: SIRS CRITERIA  SIRS Temperature  1  SIRS Pulse 1  SIRS Respirations  0  SIRS WBC 0  SIRS Score Sum  2

## 2021-07-01 NOTE — Assessment & Plan Note (Signed)
Potassium still at 3.4 with magnesium of 2.0. -Replete potassium  -Continue to monitor

## 2021-07-01 NOTE — Assessment & Plan Note (Signed)
Patient has an history of CVA.  No acute concern. -Continue home dose of Lipitor -Holding home aspirin due to thrombocytopenia

## 2021-07-01 NOTE — Assessment & Plan Note (Signed)
Patient met sepsis criteria with fever, tachycardia and tachypnea.  UA concerning for UTI.  Preliminary blood cultures with Streptococcus anginosus, please evaluate bedside history of recent esophageal dilatation.  Patient is already on preferred antibiotic of ceftriaxone.  Urine cultures with E. Coli Remained febrile with significant pain.  He is -Continue ceftriaxone -Continue supportive care -Follow-up cultures -Repeat blood cultures on Sunday and after 48 hours of negative cultures PICC line will be placed for at least 6 weeks of antibiotics -Echocardiogram ordered- pending.  We will avoid TEE due to history of esophageal cancer and multiple strictures requiring dilatations.

## 2021-07-01 NOTE — Assessment & Plan Note (Signed)
Blood pressure mildly elevated, most likely pain is playing a role. -Continue home losartan and metoprolol -Continue to monitor

## 2021-07-01 NOTE — Progress Notes (Signed)
*  PRELIMINARY RESULTS* Echocardiogram 2D Echocardiogram has been performed.  Andrew Mullins 07/01/2021, 2:08 PM

## 2021-07-01 NOTE — Assessment & Plan Note (Signed)
Patient has a recent esophageal dilatation at [facility].  History of multiple esophageal dilations in the past.  CTA chest with concern of esophagitis. Might have post dilatation bacteremia. -Continue with Protonix

## 2021-07-01 NOTE — Assessment & Plan Note (Signed)
Urine cultures growing E. Coli.  Patient is already on ceftriaxone for Streptococcus bacteremia.  Only resistant to ampicillin and sulbactam. -Continue ceftriaxone

## 2021-07-02 ENCOUNTER — Encounter
Admission: EM | Disposition: A | Discharge status: Home Health | Payer: Self-pay | Source: Home / Self Care | Attending: Obstetrics and Gynecology

## 2021-07-02 ENCOUNTER — Inpatient Hospital Stay: Payer: Medicare Other | Admitting: Anesthesiology

## 2021-07-02 ENCOUNTER — Inpatient Hospital Stay: Payer: Medicare Other

## 2021-07-02 ENCOUNTER — Other Ambulatory Visit: Payer: Self-pay

## 2021-07-02 DIAGNOSIS — M4620 Osteomyelitis of vertebra, site unspecified: Secondary | ICD-10-CM | POA: Diagnosis not present

## 2021-07-02 DIAGNOSIS — B955 Unspecified streptococcus as the cause of diseases classified elsewhere: Secondary | ICD-10-CM

## 2021-07-02 DIAGNOSIS — R7881 Bacteremia: Secondary | ICD-10-CM | POA: Diagnosis not present

## 2021-07-02 DIAGNOSIS — M4644 Discitis, unspecified, thoracic region: Secondary | ICD-10-CM

## 2021-07-02 HISTORY — PX: THORACIC LAMINECTOMY FOR EPIDURAL ABSCESS: SHX6115

## 2021-07-02 LAB — SURGICAL PCR SCREEN
MRSA, PCR: NEGATIVE
Staphylococcus aureus: NEGATIVE

## 2021-07-02 SURGERY — THORACIC LAMINECTOMY FOR EPIDURAL ABSCESS
Anesthesia: General | Laterality: Bilateral

## 2021-07-02 MED ORDER — PHENYLEPHRINE HCL (PRESSORS) 10 MG/ML IV SOLN
INTRAVENOUS | Status: DC | PRN
  Administered 2021-07-02: 160 ug via INTRAVENOUS
  Administered 2021-07-02 (×2): 80 ug via INTRAVENOUS
  Administered 2021-07-02 (×2): 160 ug via INTRAVENOUS

## 2021-07-02 MED ORDER — ACETAMINOPHEN 10 MG/ML IV SOLN
INTRAVENOUS | Status: AC
  Filled 2021-07-02: qty 100

## 2021-07-02 MED ORDER — MUPIROCIN 2 % EX OINT
1.0000 | TOPICAL_OINTMENT | Freq: Two times a day (BID) | CUTANEOUS | Status: AC
  Administered 2021-07-03 – 2021-07-06 (×8): 1 via NASAL
  Filled 2021-07-02: qty 22

## 2021-07-02 MED ORDER — ONDANSETRON HCL 4 MG/2ML IJ SOLN
INTRAMUSCULAR | Status: DC | PRN
  Administered 2021-07-02: 4 mg via INTRAVENOUS

## 2021-07-02 MED ORDER — KETOROLAC TROMETHAMINE 30 MG/ML IJ SOLN
INTRAMUSCULAR | Status: DC | PRN
  Administered 2021-07-02: 30 mg via INTRAVENOUS

## 2021-07-02 MED ORDER — MEPERIDINE HCL 25 MG/ML IJ SOLN: 6.2500 mg | INTRAMUSCULAR | Status: DC | PRN

## 2021-07-02 MED ORDER — FENTANYL CITRATE (PF) 100 MCG/2ML IJ SOLN
INTRAMUSCULAR | Status: DC | PRN
Start: 2021-07-02 — End: 2021-07-02
  Administered 2021-07-02: 25 ug via INTRAVENOUS
  Administered 2021-07-02: 50 ug via INTRAVENOUS
  Administered 2021-07-02: 25 ug via INTRAVENOUS

## 2021-07-02 MED ORDER — EPHEDRINE 5 MG/ML INJ
INTRAVENOUS | Status: AC
  Filled 2021-07-02: qty 5

## 2021-07-02 MED ORDER — PROPOFOL 10 MG/ML IV BOLUS
INTRAVENOUS | Status: AC
  Filled 2021-07-02: qty 20

## 2021-07-02 MED ORDER — 0.9 % SODIUM CHLORIDE (POUR BTL) OPTIME
TOPICAL | Status: DC | PRN
  Administered 2021-07-02: 500 mL

## 2021-07-02 MED ORDER — ROCURONIUM BROMIDE 10 MG/ML (PF) SYRINGE
PREFILLED_SYRINGE | INTRAVENOUS | Status: AC
  Filled 2021-07-02: qty 10

## 2021-07-02 MED ORDER — CEFAZOLIN SODIUM 1 G IJ SOLR
INTRAMUSCULAR | Status: AC
  Filled 2021-07-02: qty 60

## 2021-07-02 MED ORDER — FENTANYL CITRATE (PF) 100 MCG/2ML IJ SOLN
INTRAMUSCULAR | Status: AC
  Filled 2021-07-02: qty 2

## 2021-07-02 MED ORDER — PHENYLEPHRINE HCL-NACL 20-0.9 MG/250ML-% IV SOLN
INTRAVENOUS | Status: AC
  Filled 2021-07-02: qty 250

## 2021-07-02 MED ORDER — SODIUM CHLORIDE FLUSH 0.9 % IV SOLN
INTRAVENOUS | Status: AC
  Filled 2021-07-02: qty 10

## 2021-07-02 MED ORDER — ONDANSETRON HCL 4 MG/2ML IJ SOLN
INTRAMUSCULAR | Status: AC
  Filled 2021-07-02: qty 2

## 2021-07-02 MED ORDER — CEFAZOLIN SODIUM-DEXTROSE 2-3 GM-%(50ML) IV SOLR
INTRAVENOUS | Status: DC | PRN
  Administered 2021-07-02: 2 g via INTRAVENOUS

## 2021-07-02 MED ORDER — PROPOFOL 10 MG/ML IV BOLUS
INTRAVENOUS | Status: DC | PRN
  Administered 2021-07-02: 140 mg via INTRAVENOUS

## 2021-07-02 MED ORDER — BUPIVACAINE-EPINEPHRINE (PF) 0.5% -1:200000 IJ SOLN
INTRAMUSCULAR | Status: DC | PRN
  Administered 2021-07-02: 4 mL via PERINEURAL

## 2021-07-02 MED ORDER — SUGAMMADEX SODIUM 200 MG/2ML IV SOLN
INTRAVENOUS | Status: DC | PRN
  Administered 2021-07-02: 200 mg via INTRAVENOUS

## 2021-07-02 MED ORDER — FENTANYL CITRATE (PF) 100 MCG/2ML IJ SOLN: 25.0000 ug | INTRAMUSCULAR | Status: DC | PRN

## 2021-07-02 MED ORDER — LIDOCAINE HCL (CARDIAC) PF 100 MG/5ML IV SOSY
PREFILLED_SYRINGE | INTRAVENOUS | Status: DC | PRN
  Administered 2021-07-02: 100 mg via INTRAVENOUS

## 2021-07-02 MED ORDER — HYDROCODONE-ACETAMINOPHEN 7.5-325 MG PO TABS: 1.0000 | ORAL_TABLET | Freq: Once | ORAL | Status: DC | PRN

## 2021-07-02 MED ORDER — SODIUM CHLORIDE (PF) 0.9 % IJ SOLN
INTRAMUSCULAR | Status: AC
  Filled 2021-07-02: qty 40

## 2021-07-02 MED ORDER — DROPERIDOL 2.5 MG/ML IJ SOLN: 0.6250 mg | Freq: Once | INTRAMUSCULAR | Status: DC | PRN

## 2021-07-02 MED ORDER — PHENYLEPHRINE HCL-NACL 20-0.9 MG/250ML-% IV SOLN
INTRAVENOUS | Status: DC | PRN
  Administered 2021-07-02: 30 ug/min via INTRAVENOUS

## 2021-07-02 MED ORDER — SODIUM CHLORIDE (PF) 0.9 % IJ SOLN
INTRAMUSCULAR | Status: DC | PRN
  Administered 2021-07-02: 60 mL

## 2021-07-02 MED ORDER — DEXAMETHASONE SODIUM PHOSPHATE 10 MG/ML IJ SOLN
INTRAMUSCULAR | Status: DC | PRN
  Administered 2021-07-02: 8 mg via INTRAVENOUS

## 2021-07-02 MED ORDER — EPHEDRINE SULFATE (PRESSORS) 50 MG/ML IJ SOLN
INTRAMUSCULAR | Status: DC | PRN
  Administered 2021-07-02: 15 mg via INTRAVENOUS
  Administered 2021-07-02: 5 mg via INTRAVENOUS

## 2021-07-02 MED ORDER — ACETAMINOPHEN 10 MG/ML IV SOLN
INTRAVENOUS | Status: DC | PRN
  Administered 2021-07-02: 1000 mg via INTRAVENOUS

## 2021-07-02 MED ORDER — ONDANSETRON HCL 4 MG/2ML IJ SOLN: 4.0000 mg | Freq: Once | INTRAMUSCULAR | Status: DC | PRN

## 2021-07-02 MED ORDER — LACTATED RINGERS IV SOLN: INTRAVENOUS | Status: DC | PRN

## 2021-07-02 MED ORDER — LIDOCAINE HCL (PF) 2 % IJ SOLN
INTRAMUSCULAR | Status: AC
  Filled 2021-07-02: qty 5

## 2021-07-02 MED ORDER — HYDROMORPHONE HCL 1 MG/ML IJ SOLN
INTRAMUSCULAR | Status: DC | PRN
  Administered 2021-07-02 (×2): .5 mg via INTRAVENOUS

## 2021-07-02 MED ORDER — ROCURONIUM BROMIDE 100 MG/10ML IV SOLN
INTRAVENOUS | Status: DC | PRN
  Administered 2021-07-02: 50 mg via INTRAVENOUS

## 2021-07-02 MED ORDER — MORPHINE SULFATE (PF) 10 MG/ML IV SOLN
INTRAVENOUS | Status: DC | PRN
  Administered 2021-07-02: 4 mg via INTRAVENOUS

## 2021-07-02 MED ORDER — HYDROMORPHONE HCL 1 MG/ML IJ SOLN
INTRAMUSCULAR | Status: AC
  Filled 2021-07-02: qty 1

## 2021-07-02 MED ORDER — DEXAMETHASONE SODIUM PHOSPHATE 10 MG/ML IJ SOLN
INTRAMUSCULAR | Status: AC
  Filled 2021-07-02: qty 1

## 2021-07-02 MED ORDER — LACTATED RINGERS IV SOLN: INTRAVENOUS | Status: DC

## 2021-07-02 MED ORDER — MORPHINE SULFATE (PF) 4 MG/ML IV SOLN
INTRAVENOUS | Status: AC
  Filled 2021-07-02: qty 1

## 2021-07-02 MED ORDER — MIDAZOLAM HCL 2 MG/2ML IJ SOLN
INTRAMUSCULAR | Status: AC
  Filled 2021-07-02: qty 2

## 2021-07-02 MED ORDER — SUCCINYLCHOLINE CHLORIDE 200 MG/10ML IV SOSY
PREFILLED_SYRINGE | INTRAVENOUS | Status: AC
  Filled 2021-07-02: qty 10

## 2021-07-02 SURGICAL SUPPLY — 59 items
"PENCIL ELECTRO HAND CTR " (MISCELLANEOUS) ×1 IMPLANT
BASIN KIT SINGLE STR (MISCELLANEOUS) ×2 IMPLANT
BUR NEURO DRILL SOFT 3.0X3.8M (BURR) ×2 IMPLANT
CHLORAPREP W/TINT 26 (MISCELLANEOUS) ×2 IMPLANT
CNTNR SPEC 2.5X3XGRAD LEK (MISCELLANEOUS) ×1
CONT SPEC 4OZ STER OR WHT (MISCELLANEOUS) ×1
CONTAINER SPEC 2.5X3XGRAD LEK (MISCELLANEOUS) ×1 IMPLANT
COUNTER NEEDLE 20/40 LG (NEEDLE) ×2 IMPLANT
CUP MEDICINE 2OZ PLAST GRAD ST (MISCELLANEOUS) ×2 IMPLANT
DERMABOND ADVANCED (GAUZE/BANDAGES/DRESSINGS) ×1
DERMABOND ADVANCED .7 DNX12 (GAUZE/BANDAGES/DRESSINGS) ×1 IMPLANT
DRAPE C ARM PK CFD 31 SPINE (DRAPES) ×2 IMPLANT
DRAPE LAPAROTOMY 100X77 ABD (DRAPES) ×2 IMPLANT
DRAPE MICROSCOPE SPINE 48X150 (DRAPES) ×2 IMPLANT
DRAPE SURG 17X11 SM STRL (DRAPES) ×2 IMPLANT
DRSG OPSITE POSTOP 4X6 (GAUZE/BANDAGES/DRESSINGS) ×1 IMPLANT
ELECT BLADE 6.5 EXT (BLADE) ×1 IMPLANT
ELECT CAUTERY BLADE TIP 2.5 (TIP) ×2
ELECT EZSTD 165MM 6.5IN (MISCELLANEOUS) ×2
ELECT REM PT RETURN 9FT ADLT (ELECTROSURGICAL) ×2
ELECTRODE CAUTERY BLDE TIP 2.5 (TIP) ×1 IMPLANT
ELECTRODE EZSTD 165MM 6.5IN (MISCELLANEOUS) ×1 IMPLANT
ELECTRODE REM PT RTRN 9FT ADLT (ELECTROSURGICAL) ×1 IMPLANT
GLOVE BIOGEL PI IND STRL 6.5 (GLOVE) ×1 IMPLANT
GLOVE BIOGEL PI IND STRL 8.5 (GLOVE) ×1 IMPLANT
GLOVE BIOGEL PI INDICATOR 6.5 (GLOVE) ×1
GLOVE BIOGEL PI INDICATOR 8.5 (GLOVE) ×1
GLOVE SURG SYN 6.5 ES PF (GLOVE) ×2 IMPLANT
GLOVE SURG SYN 6.5 PF PI (GLOVE) ×1 IMPLANT
GLOVE SURG SYN 8.5  E (GLOVE) ×3
GLOVE SURG SYN 8.5 E (GLOVE) ×3 IMPLANT
GLOVE SURG SYN 8.5 PF PI (GLOVE) ×3 IMPLANT
GOWN SRG LRG LVL 4 IMPRV REINF (GOWNS) ×1 IMPLANT
GOWN SRG XL LVL 3 NONREINFORCE (GOWNS) ×1 IMPLANT
GOWN STRL NON-REIN TWL XL LVL3 (GOWNS) ×1
GOWN STRL REIN LRG LVL4 (GOWNS) ×1
GRADUATE 1200CC STRL 31836 (MISCELLANEOUS) ×2 IMPLANT
HEMOVAC 400CC 10FR (MISCELLANEOUS) ×1 IMPLANT
KIT SPINAL PRONEVIEW (KITS) ×2 IMPLANT
MANIFOLD NEPTUNE II (INSTRUMENTS) ×2 IMPLANT
MARKER SKIN DUAL TIP RULER LAB (MISCELLANEOUS) ×4 IMPLANT
NDL SAFETY ECLIPSE 18X1.5 (NEEDLE) ×1 IMPLANT
NEEDLE HYPO 18GX1.5 SHARP (NEEDLE) ×1
NEEDLE HYPO 22GX1.5 SAFETY (NEEDLE) ×2 IMPLANT
NS IRRIG 1000ML POUR BTL (IV SOLUTION) ×2 IMPLANT
PACK LAMINECTOMY NEURO (CUSTOM PROCEDURE TRAY) ×2 IMPLANT
PENCIL ELECTRO HAND CTR (MISCELLANEOUS) ×2 IMPLANT
SOLUTION IRRIG SURGIPHOR (IV SOLUTION) ×2 IMPLANT
SURGIFLO W/THROMBIN 8M KIT (HEMOSTASIS) ×2 IMPLANT
SUT DVC VLOC 3-0 CL 6 P-12 (SUTURE) ×2 IMPLANT
SUT ETHILON 3-0 FS-10 30 BLK (SUTURE) ×2
SUT VIC AB 0 CT1 27 (SUTURE) ×1
SUT VIC AB 0 CT1 27XCR 8 STRN (SUTURE) ×1 IMPLANT
SUT VIC AB 2-0 CT1 18 (SUTURE) ×2 IMPLANT
SUTURE EHLN 3-0 FS-10 30 BLK (SUTURE) IMPLANT
SYR 30ML LL (SYRINGE) ×4 IMPLANT
SYR 3ML LL SCALE MARK (SYRINGE) ×2 IMPLANT
TOWEL OR 17X26 4PK STRL BLUE (TOWEL DISPOSABLE) ×5 IMPLANT
TUBING CONNECTING 10 (TUBING) ×2 IMPLANT

## 2021-07-02 NOTE — Transfer of Care (Signed)
Immediate Anesthesia Transfer of Care Note  Patient: WEILAND TOMICH  Procedure(s) Performed: THORACIC LAMINECTOMY FOR EPIDURAL ABSCESS (Bilateral)  Patient Location: PACU  Anesthesia Type:General  Level of Consciousness: awake, alert  and oriented  Airway & Oxygen Therapy: Patient Spontanous Breathing  Post-op Assessment: Report given to RN and Post -op Vital signs reviewed and stable  Post vital signs: Reviewed and stable  Last Vitals:  Vitals Value Taken Time  BP 155/80 07/02/21 1529  Temp    Pulse 101 07/02/21 1529  Resp 16 07/02/21 1529  SpO2 95 % 07/02/21 1529  Vitals shown include unvalidated device data.  Last Pain:  Vitals:   07/02/21 1529  TempSrc:   PainSc: 0-No pain      Patients Stated Pain Goal: 2 (74/93/55 2174)  Complications: No notable events documented.

## 2021-07-02 NOTE — Assessment & Plan Note (Signed)
Urine cultures growing E. Coli with good sensitivity.  Patient is already on ceftriaxone for Streptococcus bacteremia.  Only resistant to ampicillin and sulbactam. -Continue ceftriaxone

## 2021-07-02 NOTE — Interval H&P Note (Signed)
History and Physical Interval Note:  07/02/2021 1:22 PM  Andrew Mullins  has presented today for surgery, with the diagnosis of epidural abscess.  The various methods of treatment have been discussed with the patient and family. After consideration of risks, benefits and other options for treatment, the patient has consented to  Procedure(s): THORACIC LAMINECTOMY FOR EPIDURAL ABSCESS (Bilateral) as a surgical intervention.  The patient's history has been reviewed, patient examined, no change in status, stable for surgery.  I have reviewed the patient's chart and labs.  Questions were answered to the patient's satisfaction.    Heart sounds normal no MRG. Chest Clear to Auscultation Bilaterally.   Ryn Peine

## 2021-07-02 NOTE — Progress Notes (Signed)
Progress Note   Patient: Andrew Mullins:[id number] DOB: December 19, 1955 DOA: 06/28/2021     3 DOS: the patient was seen and examined on 07/02/2021   Brief hospital course: Taken from H&P.  Andrew Mullins is a 66 y.o. male with a known history of CVA with residual weakness, prostate CA, HTN, GERD< esophageal cancer  presents to the emergency department for evaluation of shaking chills and fever.  He was seen in our ER two days ago for symptoms of a pinched nerve in his back that started after doing work in the yard.  He was given muscle relaxant, lidocaine patch and discharged home.  He returns today with complaint of confusion and weakness. Has  been taking morphine 46m PO daily for help with back pain.    Patient denies  weakness, dizziness, chest pain, shortness of breath, N/V/C/D, abdominal pain, dysuria/frequency, changes in mental status.   ED course.  On arrival to ED he was febrile at 102, met sepsis criteria with fever, tachycardia and tachypnea.  Borderline soft blood pressure which improved with IV fluid.  Patient received broad-spectrum antibiotics per sepsis protocol in ED which include cefepime, Flagyl and vancomycin.  UA was positive for nitrites and leukocytes.  He was continued on ceftriaxone and admitted for sepsis secondary to UTI.  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  Preliminary blood cultures with Streptococcus species in anaerobic bottles.  Urine cultures were ordered as an add-on. Patient continued to experience significant back pain. Multilevel lumbar impingement was noted on CTA of chest, abdomen and pelvis done on 06/27/2021. Patient is now complaining of more pain between the shoulder blade, radiating to the both shoulders, no tingling and numbness or focal weakness. Ordered MRI thoracic and lumbar spine. Neurosurgery was consulted.  Off note: [facility]  CTA chest with some concern of  esophagitis.  Might have bacteremia secondary to esophageal dilatation. ID was also consulted.  6/16: All blood cultures growing Streptococcus, most likely strep viridans secondary to recent esophageal dilatation.  Preliminary urine cultures with E. Coli.  ID is on board.  MRI of thoracic spine with concern of discitis and abscess starting from T1, recommending cervical MRI which was ordered but will not be able to obtain it until 24-hour before the prior images.  Procalcitonin markedly elevated.  Echocardiogram ordered. Neurosurgery to determine whether they can drain that epidural abscess, recommending conservative management at this time. Echocardiogram ordered-Will avoid TEE due to his history of esophageal disease. Repeat blood cultures on Sunday-if remain negative for 48 hours then PICC line will be placed Continue to have significant upper back pain. GI was also consulted to rule out perforation although prior imaging was negative, Dr. AVicente Maleswill see the patient by Monday.  Patient does not want to see any other gastroenterologist.  6/17: Patient developed another febrile episode at 102 this morning, procalcitonin improving at 15.81 today.  Rest of the labs seems stable with mild hyponatremia and hypokalemia.  Repeat urine cultures with E. coli, only resistant to ampicillin and sulbactam.  Blood cultures with Streptococcus anginosus-pending susceptibility. MRI cervical spine done this morning with osteomyelitis discitis involving T1-T2, extension of phlegmon/abscess up to C4-C5 level.  Associated moderate spinal stenosis. Continues to have 10 out of 10 pain which interferes with all the activities and sleep.  Added MS Contin does not seem working at this time.  He was using all of his Dilaudid. Talked with Dr. YCari Caraway  and they might take him to the OR for washout tomorrow if he remained febrile and continued to have pain.  Patient was made n.p.o. after midnight .  6/18: Patient afebrile this  morning, had 2 episodes of fever with maximum of 102.4 yesterday. Ordered repeat blood culture.  Prior blood cultures positive for Streptococcus anginosus with good sensitivity.  Going to the OR with neurosurgery later today for washout. Echocardiogram with normal EF and grade 1 diastolic dysfunction and no mention of any valvular disease.   Assessment and Plan: * Sepsis with Streptococcus bacteremia Patient met sepsis criteria with fever, tachycardia and tachypnea.  UA concerning for UTI.  Preliminary blood cultures with Streptococcus anginosus, history of recent esophageal dilatation.  Patient is already on preferred antibiotic of ceftriaxone.  Urine cultures with E. Coli and good sensitivity Maximum temperature of 102.4 in the past 24 hours. Going for washout with vascular surgery today -Continue ceftriaxone -Continue supportive care -Follow-up cultures -Follow-up postsurgical recommendations -Echocardiogram with normal EF and grade 1 diastolic dysfunction, no other significant abnormality noted.  We will avoid TEE due to history of esophageal cancer and multiple strictures requiring dilatations.  E. coli UTI Urine cultures growing E. Coli with good sensitivity.  Patient is already on ceftriaxone for Streptococcus bacteremia.  Only resistant to ampicillin and sulbactam. -Continue ceftriaxone   Discitis/osteomyelitis Patient with significant upper back pain with history of chronic lower back pain. MRI of thoracic and lumbar spine with concerning of discitis/osteomyelitis with some abscess involving T1-T5, cervical spine MRI cervical spine with no osteomyelitis involving the cervical spine but extension of phlegmon/abscess up to C4-5.  Going for washout with neurosurgery today -Continue with ceftriaxone -Repeat blood cultures ordered -Follow-up final culture results -Continue with pain management-MS Contin was added with Dilaudid to decrease the need of as needed meds.  Thrombocytopenia  ([place]) Platelets pretty much stable with some variations. -Keep holding aspirin and Lovenox, we will restart if continue to improve -Continue to monitor  Hypokalemia Potassium still at 3.4 with magnesium of 2.0. -Replete potassium  -Continue to monitor  CVA (cerebral vascular accident) [facility]) Patient has an history of CVA.  No acute concern. -Continue home dose of Lipitor -Holding home aspirin due to thrombocytopenia  Essential hypertension Blood pressure mildly elevated, most likely pain is playing a role. -Continue home losartan and metoprolol -Continue to monitor  Hyponatremia Some improvement of sodium to 128.  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  History of multiple esophageal dilations in the past.  CTA chest with concern of esophagitis. Might have post dilatation bacteremia. -Continue with Protonix   Subjective: Patient continued to experience 10/10 pain, waiting for washout today.  Developed AKI, skin ache  Physical Exam: Vitals:   07/02/21 0714 07/02/21 1300 07/02/21 1330 07/02/21 1345  BP: (!) 160/86 (!) 159/58 (!) 175/94 (!) 171/102  Pulse: 76 86 83 100  Resp: _0 (!) 23  Temp: 97.7 F (36.5 C)     TempSrc: Oral     SpO2: 100% 97% 97% 96%  Weight:      Height:       General.     In no acute distress. Pulmonary.  Lungs clear bilaterally, normal respiratory effort. CV.  Regular rate and rhythm, no JVD, rub or murmur. Abdomen.  Soft, nontender, nondistended, BS positive. CNS.  Alert and oriented .  No focal neurologic deficit. Extremities.  No edema, no cyanosis, pulses intact  and symmetrical. Psychiatry.  Judgment and insight appears normal.  Data Reviewed: Prior data reviewed.  Family Communication: Discussed with wife at bed site.  Disposition: Status is: Inpatient Remains inpatient appropriate because: Severity of illness.   Planned Discharge  Destination: Home with Home Health  Time spent: 43 minutes  This record has been created using Systems analyst. Errors have been sought and corrected,but may not always be located. Such creation errors do not reflect on the standard of care.  Author: Lorella Nimrod, MD 07/02/2021 2:18 PM  For on call review [email or web address].

## 2021-07-02 NOTE — Anesthesia Preprocedure Evaluation (Addendum)
Anesthesia Evaluation  Patient identified by MRN, date of birth, ID band Patient awake    Reviewed: Allergy & Precautions, NPO status , Patient's Chart, lab work & pertinent test results  History of Anesthesia Complications Negative for: history of anesthetic complications  Airway Mallampati: III  TM Distance: >3 FB Neck ROM: Full    Dental no notable dental hx.    Pulmonary neg sleep apnea, neg COPD, Patient abstained from smoking.Not current smoker, former smoker,    Pulmonary exam normal breath sounds clear to auscultation       Cardiovascular Exercise Tolerance: Good hypertension, Pt. on medications (-) Past MI, (-) Cardiac Stents and (-) CHF Normal cardiovascular exam(-) dysrhythmias  Rhythm:Regular Rate:Normal     Neuro/Psych neg Seizures CVA, No Residual Symptoms    GI/Hepatic Neg liver ROS, GERD  Medicated and Controlled,Esophageal Stricture   Endo/Other  negative endocrine ROSneg diabetes  Renal/GU negative Renal ROS     Musculoskeletal   Abdominal   Peds  Hematology negative hematology ROS (+)   Anesthesia Other Findings Past Medical History: No date: Cancer ([place])     Comment:  esophageal No date: Complication of anesthesia No date: GERD (gastroesophageal reflux disease) No date: Hypertension No date: Prostate cancer ([place]) 2020: Stroke ([place])     Comment:  no wealness   Reproductive/Obstetrics                            Anesthesia Physical Anesthesia Plan  ASA: 2 and emergent  Anesthesia Plan: General ETT   Post-op Pain Management:    Induction: Intravenous  PONV Risk Score and Plan:   Airway Management Planned: Oral ETT  Additional Equipment:   Intra-op Plan:   Post-operative Plan: Extubation in OR  Informed Consent: I have reviewed the patients History and Physical, chart, labs and discussed the procedure including the risks, benefits and alternatives for  the proposed anesthesia with the patient or authorized representative who has indicated his/her understanding and acceptance.     Dental Advisory Given  Plan Discussed with: Anesthesiologist, CRNA and Surgeon  Anesthesia Plan Comments: (Patient consented for risks of anesthesia including but not limited to:  - adverse reactions to medications - damage to eyes, teeth, lips or other oral mucosa - nerve damage due to positioning  - sore throat or hoarseness - Damage to heart, brain, nerves, lungs, other parts of body or loss of life  Patient voiced understanding.)        Anesthesia Quick Evaluation

## 2021-07-02 NOTE — Assessment & Plan Note (Signed)
Patient with significant upper back pain with history of chronic lower back pain. MRI of thoracic and lumbar spine with concerning of discitis/osteomyelitis with some abscess involving T1-T5, cervical spine MRI cervical spine with no osteomyelitis involving the cervical spine but extension of phlegmon/abscess up to C4-5.  Going for washout with neurosurgery today -Continue with ceftriaxone -Repeat blood cultures ordered -Follow-up final culture results -Continue with pain management-MS Contin was added with Dilaudid to decrease the need of as needed meds.

## 2021-07-02 NOTE — Progress Notes (Signed)
PACU preop note:Pt given Morphine '4mg'$  for 10/10 back pain by Dr. Berline Lopes, anesthesia. Pulse ox 97%.

## 2021-07-02 NOTE — Progress Notes (Signed)
Identifying Statement: Andrew Mullins is a 66 y.o. male from [place] [address] with a history of CVA, prostate CA, HTN, GERD, Esophageal cancer, and chronic lumbosacral complaints     History of Present Illness: Andrew Mullins is a 66 y.o presenting to the ER initially on 06/27/21 for evaluation of SOB with associated upper back and interscapular pain.  He states that this started about a day after doing some concrete work.  He was initially seen by his chiropractor who recommended further evaluation in the ER for concern of a pinched nerve.  He was treated with IV pain medication, muscle relaxants, and a lidocaine patch, his pain improved and he was subsequently discharged home.  He presented back to the ER on 06/28/2021 with confusion and a fever.  He was subsequently admitted for further evaluation and found to meet sepsis criteria.  His blood pressures have improved with fluids and he was started on broad-spectrum antibiotics.  He was found to have a UA positive for nitrates and leukocytes. Today he reports his confusion is better.  He continues to have interscapular pain worse on the left without any radiating pain into his arms or torso.  He states that it is improved since onset but is still about a 6 out of 10.  He also endorses chronic lumbosacral complaints which he has been undergoing chiropractic treatment for.  He is not currently having any radiating leg pain.  Interval history: 07/02/21 Andrew Mullins has had continued severe pain into his intrascapular area and has persistent fevers up to 102 degrees.     Physical Exam: Vitals:   07/02/21 0514 07/02/21 0714  BP: 116/67 (!) 160/86  Pulse: 67 76  Resp: 18 18  Temp: 97.8 F (36.6 C) 97.7 F (36.5 C)  SpO2: 96% 100%   Heart sounds normal no MRG. Chest Clear to Auscultation Bilaterally.    Neurologic exam:  Mental status: alertness: alert, orientation: person, place, time, affect: normal Speech: fluent and  clear Cranial nerves:  CN II-XII grossly intact  Motor:strength symmetric 5/5, normal muscle mass and tone in all extremities and no pronator drift Sensory: intact to light touch in all extremities Reflexes: 2+ and symmetric bilaterally for arms and legs Coordination: intact finger to nose Gait: untested    Laboratory:  +Strep in bloodstream Imaging:   I personally reviewed radiology studies to include:   CT C/A/P IMPRESSION: 1. No acute vascular findings. 2. 4.3 cm in diameter ascending thoracic aortic aneurysm. Recommend annual imaging followup by CTA or MRA. This recommendation follows 2010 ACCF/AHA/AATS/ACR/ASA/SCA/SCAI/SIR/STS/SVM Guidelines for the Diagnosis and Management of Patients with Thoracic Aortic Disease. Circulation. 2010; 121: W546-E703. Aortic aneurysm NOS (ICD10-I71.9) 3. Generally mild degrees of atherosclerosis. Aortic Atherosclerosis (ICD10-I70.0). 4. Abnormal wall thickening and indistinct margins of the upper thoracic esophagus. The appearance is nonspecific, although esophagitis would be a potential cause. Correlate with any GI symptoms and patient history in determining whether follow up gastroenterology referral is warranted. 5. Other imaging findings of potential clinical significance: Duplicated infrarenal IVC. Sigmoid colon diverticulosis. 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 Multilevel lumbar impingement.     Electronically Signed   By: Van Clines M.D.   On: 06/27/2021 12:17  MRI C spine 07/01/21 IMPRESSION: 1. 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 Associated dorsal epidural phlegmon/abscess extends cephalad to the level of C4-5, although diffuse epidural enhancement is seen throughout the cervical spine. Associated moderate spinal stenosis at C7-T1 and  T1-2. Previously questioned cord signal abnormality on prior thoracic spine is not  seen on this exam, likely artifactual on prior study. 2. No other evidence for acute infection elsewhere within the cervical spine. 3. Underlying degenerative spondylosis with resultant moderate to severe right worse than left C4 through C7 foraminal narrowing as above.     Electronically Signed   By: Jeannine Boga M.D.   On: 07/01/2021 02:20  MRI TL spine 06/30/21 IMPRESSION: 1. Findings consistent with acute osteomyelitis discitis at the T1-2 level. Associated epidural phlegmon/abscess within the adjacent epidural space, most pronounced dorsally, extending from the visualized lower cervical spine to approximately T5. Resultant mild-to-moderate spinal stenosis. Correlation with dedicated MRI of the cervical spine suggested evaluate the full extent of this process. 2. Suspected associated subtle hazy signal abnormality within the cervicothoracic cord at the level of T1. Given the lack of overt cord compression or severe stenosis at these levels, this is suspected to be related to venous/passive congestion due to the adjacent epidural process. 3. No other evidence for acute infection elsewhere within the thoracic spine. 4. Layering bilateral pleural effusions. 5. Degenerative spondylosis and facet arthrosis at T10-11 and T11-12 with resultant mild to moderate foraminal stenosis.     Electronically Signed   By: Jeannine Boga M.D.   On: 06/30/2021 02:32     Impression/Plan:       Andrew Mullins is suffering from back and interscapular pain due to discitis/osteomyelitis. He has had persistent severe pain and fevers. Due to continued symptoms, ID and hospital medicine physicians have asked for consideration of washout to decrease bioburden and control source.    At this point, I think it is reasonable to proceed with washout of the thoracic component, as that is where his symptoms are worst. I discussed the planned procedure at length with the patient, including  the risks, benefits, alternatives, and indications. The risks discussed include but are not limited to bleeding, infection, need for reoperation, spinal fluid leak, stroke, vision loss, anesthetic complication, coma, paralysis, and even death. I also described in detail that improvement was not guaranteed.  The patient expressed understanding of these risks, and asked that we proceed with surgery. I described the surgery in layman's terms, and gave ample opportunity for questions, which were answered to the best of my ability.   Meade Maw MD

## 2021-07-02 NOTE — Anesthesia Procedure Notes (Signed)
Procedure Name: Intubation Date/Time: 07/02/2021 2:02 PM  Performed by: Hedda Slade, CRNAPre-anesthesia Checklist: Patient identified, Patient being monitored, Timeout performed, Emergency Drugs available and Suction available Patient Re-evaluated:Patient Re-evaluated prior to induction Oxygen Delivery Method: Circle system utilized Preoxygenation: Pre-oxygenation with 100% oxygen Induction Type: IV induction Ventilation: Mask ventilation without difficulty and Oral airway inserted - appropriate to patient size Laryngoscope Size: 4 and McGraph Grade View: Grade I Tube type: Oral Tube size: 7.5 mm Number of attempts: 1 Airway Equipment and Method: Stylet Placement Confirmation: ETT inserted through vocal cords under direct vision, positive ETCO2 and breath sounds checked- equal and bilateral Secured at: 22 cm Tube secured with: Tape Dental Injury: Teeth and Oropharynx as per pre-operative assessment

## 2021-07-02 NOTE — Progress Notes (Signed)
PT Cancellation Note  Patient Details Name: Andrew Mullins MRN: [id number] DOB: 07-Apr-1955   Cancelled Treatment:    Reason Eval/Treat Not Completed: Other (comment) Pt having surgery today (washout). Discharged PT orders. Will need new orders post-op for PT evaluation with any mobility restrictions noted.  [name], SPT  [name] 07/02/2021, 9:10 AM

## 2021-07-02 NOTE — Assessment & Plan Note (Signed)
Patient met sepsis criteria with fever, tachycardia and tachypnea.  UA concerning for UTI.  Preliminary blood cultures with Streptococcus anginosus, history of recent esophageal dilatation.  Patient is already on preferred antibiotic of ceftriaxone.  Urine cultures with E. Coli and good sensitivity Maximum temperature of 102.4 in the past 24 hours. Going for washout with vascular surgery today -Continue ceftriaxone -Continue supportive care -Follow-up cultures -Follow-up postsurgical recommendations -Echocardiogram with normal EF and grade 1 diastolic dysfunction, no other significant abnormality noted.  We will avoid TEE due to history of esophageal cancer and multiple strictures requiring dilatations.

## 2021-07-02 NOTE — Plan of Care (Signed)
  Problem: Clinical Measurements: Goal: Signs and symptoms of infection will decrease Outcome: Progressing   Problem: Respiratory: Goal: Ability to maintain adequate ventilation will improve Outcome: Progressing

## 2021-07-02 NOTE — Anesthesia Postprocedure Evaluation (Signed)
Anesthesia Post Note  Patient: Andrew Mullins  Procedure(s) Performed: THORACIC LAMINECTOMY FOR EPIDURAL ABSCESS (Bilateral)  Patient location during evaluation: PACU Anesthesia Type: General Level of consciousness: awake and alert Pain management: pain level controlled Vital Signs Assessment: post-procedure vital signs reviewed and stable Respiratory status: spontaneous breathing, nonlabored ventilation, respiratory function stable and patient connected to nasal cannula oxygen Cardiovascular status: blood pressure returned to baseline and stable Postop Assessment: no apparent nausea or vomiting Anesthetic complications: no   No notable events documented.   Last Vitals:  Vitals:   07/02/21 1642 07/02/21 2015  BP: (!) 160/88 (!) 162/96  Pulse: 81 76  Resp: 20 18  Temp: 36.6 C 36.6 C  SpO2: 93% 97%    Last Pain:  Vitals:   07/02/21 2141  TempSrc:   PainSc: 4                  Tonny Bollman

## 2021-07-02 NOTE — Op Note (Signed)
Indications: Mr. Scipio is a 66 yo male who presented with intractable back pain and was found to have an epidural abscess.  I was consulted for washout of the epidural component.  Findings: epidural abscess  Preoperative Diagnosis: epidural abscess Postoperative Diagnosis: same   EBL: 25 ml IVF: see AR ml Drains: 1 placed Disposition: Extubated and Stable to PACU Complications: none  No foley catheter was placed.   Preoperative Note:   Risks of surgery discussed include: infection, bleeding, stroke, coma, death, paralysis, CSF leak, nerve/spinal cord injury, numbness, tingling, weakness, complex regional pain syndrome, recurrent stenosis and/or disc herniation, vascular injury, development of instability, neck/back pain, need for further surgery, persistent symptoms, development of deformity, and the risks of anesthesia. They understood these risks and have agreed to proceed.  Operative Note:   1) Thoracic T1-2 hemilaminectomy for washout of epidural abscess  The patient was then brought from the preoperative center with intravenous access established.  The patient underwent general anesthesia and endotracheal tube intubation, then was rotated on the [facility] table where all pressure points were appropriately padded.  An incision was marked with flouroscopy. The skin was then thoroughly cleansed.  Perioperative antibiotic prophylaxis was administered.  Sterile prep and drapes were then applied and a timeout was then observed.    Once this was complete a 4 cm incision was opened with the use of a #10 blade knife.  The paraspinus muscled on the right were subperiosteally dissected until the facet was visualized. Flouroscopy was used to confirm the level. A self-retaining retractor was placed.  The drill was used to drill off the right hemilamina at T1, the superior half of the T2 right hemilamina, and the underside of the T1 spinous process. A curette was used to dissect the ligamentum  flavum until the epidural fat and dura were visualized. The ligamentum was then carefully removed with 65m Kerrison punch and rongeurs.  After removal of the ligamentum, a small amount of epidural pus was encountered. This was cultured then washed out.  After the epidural component had been removed from the canal and the dura decompressed, the area was copiously irrigated.  A drain was placed.     The fascial layer was reapproximated with the use of a 0- Vicryl suture.  Subcutaneous tissue layer was reapproximated using 2-0 Vicryl suture.  The skin was then cleansed and Dermabond was used to close the skin opening.  Patient was then rotated back to the preoperative bed awakened from anesthesia and taken to recovery all counts are correct in this case.  I performed the entire procedure .   CMeade MawMD

## 2021-07-02 NOTE — Assessment & Plan Note (Signed)
Potassium still at 3.4 with magnesium of 2.0. -Replete potassium  -Continue to monitor

## 2021-07-03 ENCOUNTER — Encounter: Payer: Self-pay | Admitting: Neurosurgery

## 2021-07-03 ENCOUNTER — Ambulatory Visit: Payer: BC Managed Care – PPO | Admitting: Dermatology

## 2021-07-03 DIAGNOSIS — M4644 Discitis, unspecified, thoracic region: Secondary | ICD-10-CM | POA: Diagnosis not present

## 2021-07-03 DIAGNOSIS — R7881 Bacteremia: Secondary | ICD-10-CM | POA: Diagnosis not present

## 2021-07-03 DIAGNOSIS — K222 Esophageal obstruction: Secondary | ICD-10-CM | POA: Diagnosis not present

## 2021-07-03 DIAGNOSIS — M4620 Osteomyelitis of vertebra, site unspecified: Secondary | ICD-10-CM | POA: Diagnosis not present

## 2021-07-03 DIAGNOSIS — B955 Unspecified streptococcus as the cause of diseases classified elsewhere: Secondary | ICD-10-CM | POA: Diagnosis not present

## 2021-07-03 NOTE — Assessment & Plan Note (Signed)
Urine cultures growing E. Coli with good sensitivity.  Patient is already on ceftriaxone for Streptococcus bacteremia.  Only resistant to ampicillin and sulbactam. -Continue ceftriaxone

## 2021-07-03 NOTE — Progress Notes (Signed)
Progress Note   Patient: Andrew Mullins QMV:[id number] DOB: 1955/06/29 DOA: 06/28/2021     4 DOS: the patient was seen and examined on 07/03/2021   Brief hospital course: Taken from H&P.  Marvelle Caudill is a 66 y.o. male with a known history of CVA with residual weakness, prostate CA, HTN, GERD< esophageal cancer  presents to the emergency department for evaluation of shaking chills and fever.  He was seen in our ER two days ago for symptoms of a pinched nerve in his back that started after doing work in the yard.  He was given muscle relaxant, lidocaine patch and discharged home.  He returns today with complaint of confusion and weakness. Has  been taking morphine $RemoveBeforeD'18mg'[id number]$  PO daily for help with back pain.    Patient denies  weakness, dizziness, chest pain, shortness of breath, N/V/C/D, abdominal pain, dysuria/frequency, changes in mental status.   ED course.  On arrival to ED he was febrile at 102, met sepsis criteria with fever, tachycardia and tachypnea.  Borderline soft blood pressure which improved with IV fluid.  Patient received broad-spectrum antibiotics per sepsis protocol in ED which include cefepime, Flagyl and vancomycin.  UA was positive for nitrites and leukocytes.  He was continued on ceftriaxone and admitted for sepsis secondary to UTI.  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  Preliminary blood cultures with Streptococcus species in anaerobic bottles.  Urine cultures were ordered as an add-on. Patient continued to experience significant back pain. Multilevel lumbar impingement was noted on CTA of chest, abdomen and pelvis done on 06/27/2021. Patient is now complaining of more pain between the shoulder blade, radiating to the both shoulders, no tingling and numbness or focal weakness. Ordered MRI thoracic and lumbar spine. Neurosurgery was consulted.  Off note: Patient has esophageal dilatation at [facility] 2 weeks ago.  CTA chest with some concern of  esophagitis.  Might have bacteremia secondary to esophageal dilatation. ID was also consulted.  6/16: All blood cultures growing Streptococcus, most likely strep viridans secondary to recent esophageal dilatation.  Preliminary urine cultures with E. Coli.  ID is on board.  MRI of thoracic spine with concern of discitis and abscess starting from T1, recommending cervical MRI which was ordered but will not be able to obtain it until 24-hour before the prior images.  Procalcitonin markedly elevated.  Echocardiogram ordered. Neurosurgery to determine whether they can drain that epidural abscess, recommending conservative management at this time. Echocardiogram ordered-Will avoid TEE due to his history of esophageal disease. Repeat blood cultures on Sunday-if remain negative for 48 hours then PICC line will be placed Continue to have significant upper back pain. GI was also consulted to rule out perforation although prior imaging was negative, Dr. Vicente Males will see the patient by Monday.  Patient does not want to see any other gastroenterologist.  6/17: Patient developed another febrile episode at 102 this morning, procalcitonin improving at 15.81 today.  Rest of the labs seems stable with mild hyponatremia and hypokalemia.  Repeat urine cultures with E. coli, only resistant to ampicillin and sulbactam.  Blood cultures with Streptococcus anginosus-pending susceptibility. MRI cervical spine done this morning with osteomyelitis discitis involving T1-T2, extension of phlegmon/abscess up to C4-C5 level.  Associated moderate spinal stenosis. Continues to have 10 out of 10 pain which interferes with all the activities and sleep.  Added MS Contin does not seem working at this time.  He was using all of his Dilaudid. Talked with Dr. Cari Caraway  and they might take him to the OR for washout tomorrow if he remained febrile and continued to have pain.  Patient was made n.p.o. after midnight .  6/18: Patient afebrile this  morning, had 2 episodes of fever with maximum of 102.4 yesterday. Ordered repeat blood culture.  Prior blood cultures positive for Streptococcus anginosus with good sensitivity.  Going to the OR with neurosurgery later today for washout. Echocardiogram with normal EF and grade 1 diastolic dysfunction and no mention of any valvular disease.  6/19: Pain seems improving after the procedure Patient underwent T1-2 Hemilaminectomy for washout of epidural abscess with neurosurgery on 07/02/2021.  Drain was placed and will remain in and is being managed by neurosurgery.  Preliminary wound cultures with no organisms yet.  Remained afebrile over the past 24 hours.  Repeat blood cultures done on 6/18 are still pending. Patient with complicated history of recurrent esophageal strictures requiring multiple dilatations, per patient he has been dilated 82 times.  GI was also consulted, per GI if he developed dysphagia again which is anticipated in the next couple of weeks, will need to follow-up with [facility] GI and might need prophylactic antibiotics.   Assessment and Plan: * Sepsis with Streptococcus bacteremia Patient met sepsis criteria with fever, tachycardia and tachypnea.  UA concerning for UTI.  Preliminary blood cultures with Streptococcus anginosus, history of recent esophageal dilatation.  Patient is already on preferred antibiotic of ceftriaxone.  Urine cultures with E. Coli and good sensitivity Maximum temperature of 102.4 in the past 24 hours. Going for washout with vascular surgery today -Continue ceftriaxone -Continue supportive care -Follow-up cultures -Follow-up postsurgical recommendations -Echocardiogram with normal EF and grade 1 diastolic dysfunction, no other significant abnormality noted.  We will avoid TEE due to history of esophageal cancer and multiple strictures requiring dilatations.  E. coli UTI Urine cultures growing E. Coli with good sensitivity.  Patient is already on ceftriaxone for  Streptococcus bacteremia.  Only resistant to ampicillin and sulbactam. -Continue ceftriaxone   Discitis/osteomyelitis S/p hemilaminectomy involving T1-T2 for washout of epidural abscess on 07/02/2021.  Pain seems improving after the procedure. MRI of thoracic and lumbar spine with concerning of discitis/osteomyelitis with some abscess involving T1-T5, cervical spine MRI cervical spine with no osteomyelitis involving the cervical spine but extension of phlegmon/abscess up to C4-5.   -Continue with ceftriaxone -Repeat blood cultures obtained on 07/02/2021-still pending -Follow-up final culture results -Continue with pain management-MS Contin was added with Dilaudid to decrease the need of as needed meds.  Thrombocytopenia ([place]) Platelets pretty much stable with some variations. -Keep holding aspirin and Lovenox, we will restart if continue to improve -Continue to monitor  Hypokalemia Potassium still at 3.4 with magnesium of 2.0. -Replete potassium  -Continue to monitor  CVA (cerebral vascular accident) [facility]) Patient has an history of CVA.  No acute concern. -Continue home dose of Lipitor -Holding home aspirin due to thrombocytopenia  Essential hypertension Blood pressure mildly elevated, most likely pain is playing a role. -Continue home losartan and metoprolol -Continue to monitor  Hyponatremia Some improvement of sodium to 128.  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  History of multiple esophageal dilations in the past.  CTA chest with concern of esophagitis. Might have post dilatation bacteremia. -Continue with Protonix   Subjective: Patient with some improvement in his pain and feeling much better this morning after his procedure.  Thoracic drain is in place.  Physical Exam:  Vitals:   07/02/21 1642 07/02/21 2015 07/03/21 0450 07/03/21 0700  BP: (!) 160/88 (!) 162/96 (!) 153/91 (!)  153/82  Pulse: 81 76 75 74  Resp: $Remo'20 18 18 16  'Jxwey$ Temp: 97.8 F (36.6 C) 97.8 F (36.6 C) 97.6 F (36.4 C) 98.1 F (36.7 C)  TempSrc: Oral     SpO2: 93% 97% 94%   Weight:      Height:       General.  Well-developed gentleman, in no acute distress. Pulmonary.  Lungs clear bilaterally, normal respiratory effort.  Thoracic drain in place CV.  Regular rate and rhythm, no JVD, rub or murmur. Abdomen.  Soft, nontender, nondistended, BS positive. CNS.  Alert and oriented .  No focal neurologic deficit. Extremities.  No edema, no cyanosis, pulses intact and symmetrical. Psychiatry.  Judgment and insight appears normal.  Data Reviewed: Prior data reviewed  Family Communication: Discussed with wife at bedside  Disposition: Status is: Inpatient Remains inpatient appropriate because: Severity of illness   Planned Discharge Destination: Home with Home Health  Time spent: 45 minutes  This record has been created using Systems analyst. Errors have been sought and corrected,but may not always be located. Such creation errors do not reflect on the standard of care.  Author: Lorella Nimrod, MD 07/03/2021 2:57 PM  For on call review [email or web address].

## 2021-07-03 NOTE — Progress Notes (Signed)
Pt rested in intervals throughout the night. Pain med administered as ordered, pain 7/10>>>5/10. Pt up several times to BS to void. Tol well with decreased pain. Will cont to monitor. SRP RN

## 2021-07-03 NOTE — Assessment & Plan Note (Signed)
Some improvement of sodium to 128.  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

## 2021-07-03 NOTE — Progress Notes (Signed)
Met with the patient at the bedside Andrew Mullins lives at home with his wife, she provides transportation when he cant drive He has DME at home and does not need additional, He agrees to [facility] and is agreeable to [facility], I contacted [facility] and awaiting a response if they can accept the patient
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
## 2021-07-03 NOTE — Consult Note (Signed)
[name] , MD [address], [place], [place], [place], [address] [address], [place], [place], [place], [address] Phone: [phone]  Fax: [phone]  Consultation  Referring Provider:     No ref. provider found Primary Care Physician:  Baxter Hire, MD Primary Gastroenterologist:  Dr. Vicente Males   Reason for Consultation:     Esophageal stricture   Date of Admission:  06/28/2021 Date of Consultation:  07/03/2021         HPI:   Andrew Mullins is a 66 y.o. male who is very well-known to me.  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  He has been in remission from his cancer point of view.  Over the past 15 years he has had over 57 esophageal dilations for a stricture that has occurred at the site of radiation.  Initially he was having a dilation every few months to years and over the last 1 to 2 years has been requiring 1 every 2 to 3 weeks.  I have dilated him on multiple occasions at our hospital and I was not able to keep his esophagus dilated adequately hence referred him for a stent which was placed and also failed he has had steroids injected into the stricture and more than 2 occasions which has also failed.  I subsequently referred him to Dr. Okey Dupre at [facility] gastroenterology who has tried needle-knife cautery, cryoablation on a few occasions followed by dilation to 20 mm but the stricture quickly recurs within 2 to 3 weeks time.  The patient came into the hospital at this time for pain between his shoulder blades which occurred a few days after his last dilation he was having fevers with chills and rigors.  He does recollect that he had an episode of chills and rigors after procedure which was 3 dilations prior.  He attributed it to his anesthesia.  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  He has had fevers while in the hospital.  Since surgery he is doing much better the pain is better.  Past Medical History:  Diagnosis Date   Cancer [facility])    esophageal   Complication of anesthesia    GERD (gastroesophageal reflux disease)    Hypertension    Prostate cancer ([place])    Stroke ([place]) 2020   no wealness    Past Surgical History:  Procedure Laterality Date   broken bones repair     COLONOSCOPY WITH PROPOFOL N/A 11/11/2020   Procedure: COLONOSCOPY WITH PROPOFOL;  Surgeon: [name], MD;  Location: [facility] ENDOSCOPY;  Service: Gastroenterology;  Laterality: N/A;   ESOPHAGOGASTRODUODENOSCOPY (EGD) WITH PROPOFOL N/A 02/18/2018   Procedure: ESOPHAGOGASTRODUODENOSCOPY (EGD) WITH PROPOFOL;  Surgeon: [name], MD;  Location: [facility] ENDOSCOPY;  Service: Gastroenterology;  Laterality: N/A;   ESOPHAGOGASTRODUODENOSCOPY (EGD) WITH PROPOFOL N/A 04/01/2018   Procedure: ESOPHAGOGASTRODUODENOSCOPY (EGD) WITH PROPOFOL;  Surgeon: [name], MD;  Location: [facility] ENDOSCOPY;  Service: Gastroenterology;  Laterality: N/A;   ESOPHAGOGASTRODUODENOSCOPY (EGD) WITH PROPOFOL N/A 12/02/2018   Procedure: ESOPHAGOGASTRODUODENOSCOPY (EGD) WITH PROPOFOL with Dilation;  Surgeon: [name], MD;  Location: [facility] ENDOSCOPY;  Service: Gastroenterology;  Laterality: N/A;   ESOPHAGOGASTRODUODENOSCOPY (EGD) WITH PROPOFOL N/A 05/19/2019   Procedure:  ESOPHAGOGASTRODUODENOSCOPY (EGD) WITH PROPOFOL;  Surgeon: [name], MD;  Location: [facility] ENDOSCOPY;  Service: Gastroenterology;  Laterality: N/A;   ESOPHAGOGASTRODUODENOSCOPY (EGD) WITH PROPOFOL N/A 05/18/2019   Procedure: ESOPHAGOGASTRODUODENOSCOPY (EGD) WITH PROPOFOL with Dilation;  Surgeon: [name], MD;  Location: [facility] ENDOSCOPY;  Service: Gastroenterology;  Laterality: N/A;   ESOPHAGOGASTRODUODENOSCOPY (EGD) WITH PROPOFOL N/A 06/25/2019   Procedure:  ESOPHAGOGASTRODUODENOSCOPY (EGD) WITH PROPOFOL  with Dilation;  Surgeon: [name], MD;  Location: [facility] ENDOSCOPY;  Service: Gastroenterology;  Laterality: N/A;   ESOPHAGOGASTRODUODENOSCOPY (EGD) WITH PROPOFOL N/A 07/09/2019   Procedure: ESOPHAGOGASTRODUODENOSCOPY (EGD) WITH PROPOFOL with Dilation;  Surgeon: [name], MD;  Location: [facility] ENDOSCOPY;  Service: Gastroenterology;  Laterality: N/A;  Pt requests early morning   ESOPHAGOGASTRODUODENOSCOPY (EGD) WITH PROPOFOL N/A 08/11/2019   Procedure: ESOPHAGOGASTRODUODENOSCOPY (EGD) WITH PROPOFOL with Dilation;  Surgeon: [name], MD;  Location: [facility] ENDOSCOPY;  Service: Gastroenterology;  Laterality: N/A;   ESOPHAGOGASTRODUODENOSCOPY (EGD) WITH PROPOFOL N/A 09/11/2019   Procedure: ESOPHAGOGASTRODUODENOSCOPY (EGD) WITH PROPOFOL with Dilation;  Surgeon: [name], MD;  Location: [facility] ENDOSCOPY;  Service: Gastroenterology;  Laterality: N/A;   ESOPHAGOGASTRODUODENOSCOPY (EGD) WITH PROPOFOL N/A 10/02/2019   Procedure: ESOPHAGOGASTRODUODENOSCOPY (EGD) WITH PROPOFOL with Dilation;  Surgeon: [name], MD;  Location: [facility] ENDOSCOPY;  Service: Gastroenterology;  Laterality: N/A;   ESOPHAGOGASTRODUODENOSCOPY (EGD) WITH PROPOFOL N/A 11/04/2019   Procedure: ESOPHAGOGASTRODUODENOSCOPY (EGD) WITH PROPOFOL;  Surgeon: [name], MD;  Location: [facility] ENDOSCOPY;  Service: Gastroenterology;  Laterality: N/A;   ESOPHAGOGASTRODUODENOSCOPY (EGD) WITH PROPOFOL N/A 02/05/2020   Procedure: ESOPHAGOGASTRODUODENOSCOPY (EGD) WITH PROPOFOL;  Surgeon: [name], MD;  Location: [facility] ENDOSCOPY;  Service: Gastroenterology;  Laterality: N/A;   ESOPHAGOGASTRODUODENOSCOPY (EGD) WITH PROPOFOL N/A 02/19/2020   Procedure: ESOPHAGOGASTRODUODENOSCOPY (EGD) WITH PROPOFOL;  Surgeon: [name], MD;  Location: [facility] ENDOSCOPY;  Service: Gastroenterology;  Laterality: N/A;   ESOPHAGOGASTRODUODENOSCOPY (EGD) WITH PROPOFOL N/A 03/10/2020   Procedure: ESOPHAGOGASTRODUODENOSCOPY (EGD)  WITH PROPOFOL;  Surgeon: [name], MD;  Location: [facility] ENDOSCOPY;  Service: Gastroenterology;  Laterality: N/A;   ESOPHAGOGASTRODUODENOSCOPY (EGD) WITH PROPOFOL N/A 03/31/2020   Procedure: ESOPHAGOGASTRODUODENOSCOPY (EGD) WITH PROPOFOL;  Surgeon: [name], MD;  Location: [facility] ENDOSCOPY;  Service: Gastroenterology;  Laterality: N/A;  7:30 AM PROCEDURE PER DR Helma Argyle C-19 TEST ON 03/30/2020 AM   ESOPHAGOGASTRODUODENOSCOPY (EGD) WITH PROPOFOL N/A 04/22/2020   Procedure: ESOPHAGOGASTRODUODENOSCOPY (EGD) WITH PROPOFOL;  Surgeon: [name], MD;  Location: [facility] ENDOSCOPY;  Service: Gastroenterology;  Laterality: N/A;   ESOPHAGOGASTRODUODENOSCOPY (EGD) WITH PROPOFOL N/A 05/26/2020   Procedure: ESOPHAGOGASTRODUODENOSCOPY (EGD) WITH PROPOFOL;  Surgeon: [name], MD;  Location: [facility] ENDOSCOPY;  Service: Gastroenterology;  Laterality: N/A;   EYE SURGERY     retinal detatchment   radical prostate     THORACIC LAMINECTOMY FOR EPIDURAL ABSCESS Bilateral 07/02/2021   Procedure: THORACIC LAMINECTOMY FOR EPIDURAL ABSCESS;  Surgeon: Meade Maw, MD;  Location: [facility] ORS;  Service: Neurosurgery;  Laterality: Bilateral;   TONSILLECTOMY      Prior to Admission medications   Medication Sig Start Date End Date Taking? Authorizing Provider  aspirin EC 81 MG tablet Take 81 mg by mouth daily.   Yes [provider]  atorvastatin (LIPITOR) 40 MG tablet Take 1 tablet (40 mg total) by mouth daily at 6 PM. 09/18/18  Yes Fritzi Mandes, MD  famciclovir (FAMVIR) 250 MG tablet Take 250 mg by mouth 2 (two) times daily.   Yes [provider]  gabapentin (NEURONTIN) 100 MG capsule Take 200 mg by mouth 2 (two) times daily. 05/02/20  Yes [provider]  ibuprofen (ADVIL) 200  MG tablet Take 400-600 mg by mouth at bedtime as needed for moderate pain.   Yes [provider]  losartan (COZAAR) 50 MG tablet Take 50 mg by mouth daily. 08/20/18  Yes [provider]  metoprolol tartrate  (LOPRESSOR) 25 MG tablet Take 25 mg by mouth 2 (two) times daily.   Yes [provider]  Multiple Vitamins-Minerals (MULTIVITAMIN ADULT) CHEW Chew 2 tablets by mouth every morning.   Yes [provider]  omeprazole (PRILOSEC) 20 MG capsule Take 20 mg by mouth daily.   Yes [provider]  aspirin EC 325 MG EC tablet Take 1 tablet (325 mg total) by mouth daily. Patient not taking: Reported on 06/29/2021 09/19/18   Fritzi Mandes, MD  gabapentin (NEURONTIN) 300 MG capsule Take 300 mg by mouth 3 (three) times daily. Patient not taking: Reported on 06/29/2021    [provider]    History reviewed. No pertinent family history.   Social History   Tobacco Use   Smoking status: Former   Smokeless tobacco: Never  Vaping Use   Vaping Use: Never used  Substance Use Topics   Alcohol use: Not Currently   Drug use: Never    Allergies as of 06/28/2021 - Review Complete 06/28/2021  Allergen Reaction Noted   Hydralazine Other (See Comments) 06/27/2021    Review of Systems:    All systems reviewed and negative except where noted in HPI.   Physical Exam:  Vital signs in last 24 hours: Temp:  [97.6 F (36.4 C)-98.4 F (36.9 C)] 98.1 F (36.7 C) (06/19 0700) Pulse Rate:  [74-101] 74 (06/19 0700) Resp:  [16-25] 16 (06/19 0700) BP: (152-175)/(58-102) 153/82 (06/19 0700) SpO2:  [93 %-97 %] 94 % (06/19 0450) Last BM Date : 06/28/21 General:   Pleasant, cooperative in NAD Head:  Normocephalic and atraumatic. Eyes:   No icterus.   Conjunctiva pink. PERRLA. Ears:  Normal auditory acuity. Neurologic:  Alert and oriented x3;  grossly normal neurologically. Skin:  Intact without significant lesions or rashes. Cervical Nodes:  No significant cervical adenopathy. Psych:  Alert and cooperative. Normal affect.  LAB RESULTS: Recent Labs    07/01/21 0510  WBC 7.2  HGB 11.7*  HCT 34.8*  PLT 95*   BMET Recent Labs    07/01/21 0510  NA 127*  K 3.4*  CL 92*   CO2 27  GLUCOSE 158*  BUN 14  CREATININE 0.62  CALCIUM 8.4*   LFT No results for input(s): "PROT", "ALBUMIN", "AST", "ALT", "ALKPHOS", "BILITOT", "BILIDIR", "IBILI" in the last 72 hours. PT/INR No results for input(s): "LABPROT", "INR" in the last 72 hours.  STUDIES: DG Thoracic Spine 2 View  Result Date: 07/02/2021 CLINICAL DATA:  Thoracic T1-T2 hemilaminectomy and epidural washout EXAM: THORACIC SPINE 2 VIEWS COMPARISON:  MRI 06/30/2021 FINDINGS: A single C-arm fluoroscopic image was obtained intraoperatively and submitted for post operative interpretation. Frontal image of the cervicothoracic junction demonstrates surgical instrumentation overlying the T1 level. 1 second of fluoroscopy time utilized. Radiation dose: Not provided. Please see the performing provider's procedural report for further detail. IMPRESSION: As above. Electronically Signed   By: Davina Poke D.O.   On: 07/02/2021 16:24   DG C-Arm 1-60 Min-No Report  Result Date: 07/02/2021 Fluoroscopy was utilized by the requesting physician.  No radiographic interpretation.   ECHOCARDIOGRAM COMPLETE  Result Date: 07/01/2021    ECHOCARDIOGRAM REPORT   Patient Name:   Andrew Mullins Date of Exam: 07/01/2021 Medical Rec #:  [id number]  Height:       72.0 in Accession #:    [id number]         Weight:       205.0 lb Date of Birth:  November 09, 1955          BSA:          2.153 m Patient Age:    75 years           BP:           96/53 mmHg Patient Gender: M                  HR:           60 bpm. Exam Location:  [facility] Procedure: 2D Echo Indications:     Bacteremia R78.81  History:         Patient has prior history of Echocardiogram examinations, most                  recent 09/18/2018.  Sonographer:     Kathlen Brunswick RDCS Referring Phys:  [id number] ZDGLOVF AMIN Diagnosing Phys: Yolonda Kida MD IMPRESSIONS  1. Left ventricular ejection fraction, by estimation, is 55 to 60%. The left ventricle has normal function. The left  ventricle has no regional wall motion abnormalities. There is moderate concentric left ventricular hypertrophy. Left ventricular diastolic parameters are consistent with Grade I diastolic dysfunction (impaired relaxation).  2. Right ventricular systolic function is normal. The right ventricular size is normal.  3. The mitral valve is normal in structure. Trivial mitral valve regurgitation.  4. The aortic valve is grossly normal. Aortic valve regurgitation is not visualized. FINDINGS  Left Ventricle: Left ventricular ejection fraction, by estimation, is 55 to 60%. The left ventricle has normal function. The left ventricle has no regional wall motion abnormalities. The left ventricular internal cavity size was normal in size. There is  moderate concentric left ventricular hypertrophy. Left ventricular diastolic parameters are consistent with Grade I diastolic dysfunction (impaired relaxation). Right Ventricle: The right ventricular size is normal. No increase in right ventricular wall thickness. Right ventricular systolic function is normal. Left Atrium: Left atrial size was normal in size. Right Atrium: Right atrial size was normal in size. Pericardium: There is no evidence of pericardial effusion. Mitral Valve: The mitral valve is normal in structure. Trivial mitral valve regurgitation. Tricuspid Valve: The tricuspid valve is grossly normal. Tricuspid valve regurgitation is trivial. Aortic Valve: The aortic valve is grossly normal. Aortic valve regurgitation is not visualized. Aortic valve peak gradient measures 9.2 mmHg. Pulmonic Valve: The pulmonic valve was normal in structure. Pulmonic valve regurgitation is not visualized. Aorta: The ascending aorta was not well visualized. IAS/Shunts: No atrial level shunt detected by color flow Doppler.  LEFT VENTRICLE PLAX 2D LVIDd:         4.63 cm     Diastology LVIDs:         3.18 cm     LV e' medial:    6.31 cm/s LV PW:         1.57 cm     LV E/e' medial:  7.8 LV IVS:         1.33 cm     LV e' lateral:   12.50 cm/s LVOT diam:     2.20 cm     LV E/e' lateral: 3.9 LV SV:         82 LV SV Index:   38 LVOT Area:     3.80 cm  LV Volumes (MOD) LV vol d, MOD A2C: 86.0 ml LV vol d, MOD A4C: 60.2 ml LV vol s, MOD A2C: 32.5 ml LV vol s, MOD A4C: 25.6 ml LV SV MOD A2C:     53.5 ml LV SV MOD A4C:     60.2 ml LV SV MOD BP:      45.1 ml RIGHT VENTRICLE RV S prime:     18.70 cm/s TAPSE (M-mode): 2.3 cm LEFT ATRIUM             Index        RIGHT ATRIUM           Index LA diam:        3.90 cm 1.81 cm/m   RA Area:     19.50 cm LA Vol (A2C):   34.1 ml 15.84 ml/m  RA Volume:   51.40 ml  23.87 ml/m LA Vol (A4C):   49.3 ml 22.90 ml/m LA Biplane Vol: 43.6 ml 20.25 ml/m  AORTIC VALVE                 PULMONIC VALVE AV Area (Vmax): 2.85 cm     PV Vmax:       1.17 m/s AV Vmax:        152.00 cm/s  PV Peak grad:  5.5 mmHg AV Peak Grad:   9.2 mmHg LVOT Vmax:      114.00 cm/s LVOT Vmean:     73.600 cm/s LVOT VTI:       0.216 m  AORTA Ao Root diam: 3.50 cm Ao Asc diam:  3.70 cm MITRAL VALVE               TRICUSPID VALVE MV Area (PHT): 2.43 cm    TV Peak grad:   19.2 mmHg MV Decel Time: 312 msec    TV Vmax:        2.19 m/s MV E velocity: 49.30 cm/s MV A velocity: 64.30 cm/s  SHUNTS MV E/A ratio:  0.77        Systemic VTI:  0.22 m                            Systemic Diam: 2.20 cm Dwayne D Callwood MD Electronically signed by Yolonda Kida MD Signature Date/Time: 07/01/2021/4:12:51 PM    Final       Impression / Plan:   RAHMON HEIGL is a 66 y.o. y/o male with history of squamous cell carcinoma of the esophagus treated over 15 years back with radiation and chemotherapy in remission subsequently developed radiation-induced stricture of the esophagus requiring over 85 dilations, I have referred him to [facility] by Dr. Ahmed Prima and has performed needle-knife cautery of the stricture, cryotherapy and dilation.  Recently admitted with fever back pains rigors chills positive blood cultures for Streptococcus  and found to have a paraspinal abscess along with acute myelitis of the spine at the T1 level likely probably related to seeding of bacteria post dilation.  He has had a very difficult stricture that is failed multiple modalities of therapy.  I did explain to him that we need to start thinking about the neck step which could very likely be an esophagectomy otherwise performing dilations at short succession would put him at increased risk of further bacteremia, perforation and requiring emergency surgery as the worst case scenario.  I am not sure if a biodegradable stent which is available in [place] may be an option,  he is planning to travel to the [place] and I have advised him to seek an opinion if desired to see if that is a possibility.  I have further suggested him to discuss with Dr. Okey Dupre if there are any other options that have not been tried.  If none then would need to discuss about esophagectomy with cardiothoracic surgery at [facility].  Plan  IV antibiotics per ID recs Further down the road a plan would be needed for the dysphagia , Dr Zenia Resides at [facility] who has been dilating him recently would be the best expert to help guide [place] if we are to continue dilation or pursue a surgical option such as an esophagectomy would be more definitive. If dilation is required in future probably prophylactic antibiotics prior to procedure would be needed.   Thank you for involving me in the care of this patient.      LOS: 4 days   [name], MD  07/03/2021, 8:54 AM

## 2021-07-03 NOTE — Progress Notes (Signed)
    Attending Progress Note  History: MAGUIRE SIME is s/p T1-2 hemilaminectomy for washout of epidural abscess.   POD1: Improved pain this morning. No acute events overnight  Physical Exam: Vitals:   07/02/21 2015 07/03/21 0450  BP: (!) 162/96 (!) 153/91  Pulse: 76 75  Resp: 18 18  Temp: 97.8 F (36.6 C) 97.6 F (36.4 C)  SpO2: 97% 94%    AA Ox3 CNI  Strength:5/5 throughout  HV 16m  Data:  Recent Labs  Lab 06/29/21 1333 06/30/21 0700 07/01/21 0510  NA 129* 128* 127*  K 3.2* 3.3* 3.4*  CL 99 96* 92*  CO2 '24 26 27  '$ BUN '21 19 14  '$ CREATININE 0.78 0.68 0.62  GLUCOSE 155* 110* 158*  CALCIUM 8.3* 8.2* 8.4*   Recent Labs  Lab 06/28/21 2151  AST 36  ALT 19  ALKPHOS 57     Recent Labs  Lab 06/29/21 1333 06/30/21 0700 07/01/21 0510  WBC 7.0 6.1 7.2  HGB 10.6* 10.1* 11.7*  HCT 31.0* 30.2* 34.8*  PLT 79* 100* 95*   Recent Labs  Lab 06/29/21 0300  APTT 44*  INR 1.3*         Other tests/results: cultures and gram stain NTD   Assessment/Plan:  AMAKOTO SELLITTOis a 66y.o presenting with intrascapular pain found to have a thoracic epidural abscess  - mobilize - pain control - ok for DVT prophylaxis - PTOT - will continue drain for now   DCooper RenderPA-C Department of Neurosurgery

## 2021-07-03 NOTE — Assessment & Plan Note (Signed)
S/p hemilaminectomy involving T1-T2 for washout of epidural abscess on 07/02/2021.  Pain seems improving after the procedure. MRI of thoracic and lumbar spine with concerning of discitis/osteomyelitis with some abscess involving T1-T5, cervical spine MRI cervical spine with no osteomyelitis involving the cervical spine but extension of phlegmon/abscess up to C4-5.   -Continue with ceftriaxone -Repeat blood cultures obtained on 07/02/2021-still pending -Follow-up final culture results -Continue with pain management-MS Contin was added with Dilaudid to decrease the need of as needed meds.

## 2021-07-04 DIAGNOSIS — M546 Pain in thoracic spine: Secondary | ICD-10-CM | POA: Diagnosis not present

## 2021-07-04 DIAGNOSIS — M4644 Discitis, unspecified, thoracic region: Secondary | ICD-10-CM | POA: Diagnosis not present

## 2021-07-04 DIAGNOSIS — Z789 Other specified health status: Secondary | ICD-10-CM

## 2021-07-04 DIAGNOSIS — K222 Esophageal obstruction: Secondary | ICD-10-CM | POA: Diagnosis not present

## 2021-07-04 DIAGNOSIS — N390 Urinary tract infection, site not specified: Secondary | ICD-10-CM | POA: Diagnosis not present

## 2021-07-04 DIAGNOSIS — Z515 Encounter for palliative care: Secondary | ICD-10-CM

## 2021-07-04 DIAGNOSIS — R7881 Bacteremia: Secondary | ICD-10-CM | POA: Diagnosis not present

## 2021-07-04 DIAGNOSIS — M4620 Osteomyelitis of vertebra, site unspecified: Secondary | ICD-10-CM | POA: Diagnosis not present

## 2021-07-04 DIAGNOSIS — A419 Sepsis, unspecified organism: Secondary | ICD-10-CM | POA: Diagnosis not present

## 2021-07-04 DIAGNOSIS — B955 Unspecified streptococcus as the cause of diseases classified elsewhere: Secondary | ICD-10-CM | POA: Diagnosis not present

## 2021-07-04 LAB — BASIC METABOLIC PANEL
Anion gap: 8 (ref 5–15)
BUN: 20 mg/dL (ref 8–23)
CO2: 30 mmol/L (ref 22–32)
Calcium: 8.6 mg/dL — ABNORMAL LOW (ref 8.9–10.3)
Chloride: 93 mmol/L — ABNORMAL LOW (ref 98–111)
Creatinine, Ser: 0.76 mg/dL (ref 0.61–1.24)
GFR, Estimated: >60 mL/min (ref >60)
Glucose, Bld: 132 mg/dL — ABNORMAL HIGH (ref 70–99)
Potassium: 3.6 mmol/L (ref 3.5–5.1)
Sodium: 131 mmol/L — ABNORMAL LOW (ref 135–145)

## 2021-07-04 LAB — CBC
HCT: 31.2 % — ABNORMAL LOW (ref 39.0–52.0)
Hemoglobin: 10.6 g/dL — ABNORMAL LOW (ref 13.0–17.0)
MCH: 27.9 pg (ref 26.0–34.0)
MCHC: 34 g/dL (ref 30.0–36.0)
MCV: 82.1 fL (ref 80.0–100.0)
Platelets: 187 10*3/uL (ref 150–400)
RBC: 3.8 MIL/uL — ABNORMAL LOW (ref 4.22–5.81)
RDW: 13.1 % (ref 11.5–15.5)
WBC: 9.4 10*3/uL (ref 4.0–10.5)
nRBC: 0 % (ref 0.0–0.2)

## 2021-07-04 MED ORDER — LABETALOL HCL 5 MG/ML IV SOLN
10.0000 mg | INTRAVENOUS | Status: DC | PRN
  Administered 2021-07-04: 10 mg via INTRAVENOUS
  Filled 2021-07-04: qty 4

## 2021-07-04 MED ORDER — ENOXAPARIN SODIUM 40 MG/0.4ML IJ SOSY
40.0000 mg | PREFILLED_SYRINGE | INTRAMUSCULAR | Status: DC
  Administered 2021-07-04 – 2021-07-11 (×8): 40 mg via SUBCUTANEOUS
  Filled 2021-07-04 (×8): qty 0.4

## 2021-07-04 MED ORDER — HYDROMORPHONE HCL 1 MG/ML IJ SOLN
1.0000 mg | INTRAMUSCULAR | Status: AC
  Administered 2021-07-04: 1 mg via INTRAVENOUS

## 2021-07-04 MED ORDER — POLYETHYLENE GLYCOL 3350 17 G PO PACK: 17.0000 g | PACK | Freq: Every day | ORAL | Status: DC | PRN

## 2021-07-04 MED ORDER — HYDROMORPHONE HCL 2 MG PO TABS: 2.0000 mg | ORAL_TABLET | Freq: Four times a day (QID) | ORAL | Status: DC

## 2021-07-04 MED ORDER — HYDROMORPHONE HCL 2 MG PO TABS
2.0000 mg | ORAL_TABLET | Freq: Four times a day (QID) | ORAL | Status: DC
  Administered 2021-07-04 – 2021-07-06 (×6): 2 mg via ORAL
  Filled 2021-07-04 (×6): qty 1

## 2021-07-04 MED ORDER — MORPHINE SULFATE ER 30 MG PO TBCR
45.0000 mg | EXTENDED_RELEASE_TABLET | Freq: Two times a day (BID) | ORAL | Status: DC
  Administered 2021-07-05 – 2021-07-07 (×5): 45 mg via ORAL
  Filled 2021-07-04 (×5): qty 1

## 2021-07-04 MED ORDER — POLYETHYLENE GLYCOL 3350 17 G PO PACK
17.0000 g | PACK | Freq: Every day | ORAL | Status: DC
Start: 2021-07-04 — End: 2021-07-11
  Administered 2021-07-05 – 2021-07-11 (×6): 17 g via ORAL
  Filled 2021-07-04 (×7): qty 1

## 2021-07-04 NOTE — Consult Note (Cosign Needed)
Consultation Note Date: 07/04/2021   Patient Name: Andrew Mullins  DOB: August 02, 1955  MRN: [id number]  Age / Sex: 66 y.o., male  PCP: Andrew Hire, MD Referring Physician: Lorella Nimrod, MD  Reason for Consultation: Pain control  HPI/Patient Profile: 66 y.o. 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   Patient was seen in ER 2 days ago for symptoms of a pinched nerve in his back status post yardwork.  Patient was given a muscle relaxant, lidocaine patch, and discharged home.  Patient returned and was admitted on on 06/28/2021 with confusion, weakness, shaking and chills.    On arrival to ED, patient was treated with sepsis protocol.  UA was positive for nitrites and leukocytes.    Approximately 2 weeks ago, patient underwent esophageal dilatation at [facility].  CT of chest with some concern of esophagitis.  Patient may have bacteremia secondary to esophageal dilatation.  During this 5-day hospitalization patient has had febrile episodes with elevated procalcitonin's.  Urine cultures grew E. coli resistant to ampicillin and sulbactam.  Blood cultures revealed Streptococcus anginosus with good susceptibility.  Echocardiogram on 618 revealed grade 1 diastolic dysfunction with normal EF and no mention of any valvular disease.   6/18, pt underwent T1-2 hemilaminectomy for washout of epidural abscess.  Drain was placed and will remain in with management by neurosurgery.  Blood cultures were repeated and still pending.  GI (Dr. Vicente Mullins at patient's request), ID and PMT were consulted. PMT was asked to be part of care team to help manage patient's pain.   Clinical Assessment and Goals of Care: I have reviewed  medical records including EPIC notes, labs and imaging, assessed the patient and then met with patient at bedside. He had just received IV pain medication and was resting in no distress.  He was able to acknowledge my presence and make his wishes known.  He confirmed that he was not in pain by shaking his head no.  When asked if I should return at a later time to discuss goals of care he nodded his head yes.   After assessing the patient I spoke with his wife Andrew Mullins over the phone to discuss diagnosis prognosis, GOC, EOL wishes, disposition and options.   I introduced Palliative Medicine as specialized medical care for people living with serious illness. It focuses on providing relief from the symptoms and stress of a serious illness. The goal is to improve quality of life for both the patient and the family.  We discussed a brief life review of the patient.  Andrew Mullins describes him as a stoic man who will not complain about pain.  She shares they do not have family nearby and it is just the 2 of them watching out for 1 another.    As far as functional and nutritional status PTA patient was independent with all ADLs.  We discussed patient's current illness. I attempted to elicit values and goals of care important to the patient.   Andrew Mullins says the patient just wants to come home and to have his pain is minimized as possible.  She endorses pain is the main goal to address.  She shares he has been through a lot since his treatment for esophageal cancer and multiple treatments since diagnosis 15 years ago.  She shares he is in an immense amount of pain but usually will not complain about it.  Detailed review of pain regimen discussed with Andrew Mullins.  Reviewed use of MS Contin, gabapentin, Tylenol, and p.o. and IV Dilaudid.  Reviewed synergistic effect of combining these medications.    Andrew Mullins is very concerned about ensuring his pain is managed once he is discharged.  Outpatient palliative services  described in detail as a way to continue care and symptom management when patient is at home.  Andrew Mullins's agreed to to referral placement at discharge.   Discussed with Andrew Mullins the importance of continued conversation with family and the medical providers regarding overall plan of care and treatment options, ensuring decisions are within the context of the patient's values and GOCs.    I agree with current regimen of initiating Dilaudid 2 mg every 4 hours PO in addition to MS Contin and gabapentin. I assured both patient and Andrew Mullins that I will continue to follow the patient throughout his hospitalization and modify medications to minimize pain.  Questions and concerns were addressed. The family was encouraged to call with questions or concerns.   Primary Decision Maker PATIENT  Code Status/Advance Care Planning: Full code  Prognosis:   Unable to determine  Discharge Planning: Home with Palliative Services  Primary Diagnoses: Present on Admission:  Sepsis with Streptococcus bacteremia  Essential hypertension  CVA (cerebral vascular accident) ([place])  Hypokalemia  Hyponatremia  Discitis/osteomyelitis  E. coli UTI   Physical Exam Vitals reviewed.  Constitutional:      General: He is not in acute distress.    Appearance: Normal appearance. He is not toxic-appearing.  HENT:     Head: Normocephalic and atraumatic.     Mouth/Throat:     Mouth: Mucous membranes are moist.  Eyes:     Pupils: Pupils are equal, round, and reactive to light.  Cardiovascular:     Rate and Rhythm: Normal rate.     Pulses: Normal pulses.  Pulmonary:     Effort: Pulmonary effort is normal. No respiratory distress.  Abdominal:     General: Abdomen is flat.  Musculoskeletal:     Comments: Generalized weakness  Skin:    General: Skin is warm and dry.  Neurological:     Mental Status: He is alert and oriented to person, place, and time.  Psychiatric:        Mood and Affect: Mood normal.         Behavior: Behavior normal.        Thought Content: Thought content normal.        Judgment: Judgment normal.     Palliative Assessment/Data: 60%     I discussed this patient's plan of care with patient, patient's wife Andrew Lower, RN Joe.  Thank you for this consult. Palliative medicine will continue to follow and assist holistically.   Time Total: 75 minutes Greater than 50%  of this time was spent counseling and coordinating care related to the above assessment and plan.  Signed by: Jordan Hawks, DNP, FNP-BC Palliative Medicine    Please contact Palliative  Medicine Team phone at [phone] for questions and concerns.  For individual provider: See Shea Evans

## 2021-07-04 NOTE — Progress Notes (Signed)
   07/04/21 1623  Assess: MEWS Score  Temp (!) 102.6 F (39.2 C)  BP (!) 132/119  MAP (mmHg) 125  Pulse Rate 90  Resp 19  Level of Consciousness Alert  SpO2 90 %  O2 Device Room Air  Assess: MEWS Score  MEWS Temp 2  MEWS Systolic 0  MEWS Pulse 0  MEWS RR 0  MEWS LOC 0  MEWS Score 2  MEWS Score Color Yellow  Assess: if the MEWS score is Yellow or Red  Were vital signs taken at a resting state? Yes  Focused Assessment No change from prior assessment  Does the patient meet 2 or more of the SIRS criteria? No  Treat  MEWS Interventions Escalated (See documentation below)  Pain Scale 0-10  Pain Score 5  Pain Type Surgical pain  Pain Location Back  Pain Orientation Upper  Pain Descriptors / Indicators Discomfort;Sore  Pain Frequency Constant  Pain Onset On-going  Pain Intervention(s) Medication (See eMAR)  Patients response to intervention Decreased  Take Vital Signs  Increase Vital Sign Frequency  Yellow: Q 2hr X 2 then Q 4hr X 2, if remains yellow, continue Q 4hrs  Escalate  MEWS: Escalate Yellow: discuss with charge nurse/RN and consider discussing with provider and RRT  Notify: Charge Nurse/RN  Name of Charge Nurse/RN Notified Jo RN  Date Charge Nurse/RN Notified 07/04/21  Time Charge Nurse/RN Notified 1623  Notify: Provider  Provider Name/Title Dr Reesa Chew  Date Provider Notified 07/04/21  Time Provider Notified 1624  Method of Notification Page  Notification Reason Change in status  Provider response No new orders  Document  Patient Outcome Stabilized after interventions  Progress note created (see row info) Yes  Assess: SIRS CRITERIA  SIRS Temperature  1  SIRS Pulse 0  SIRS Respirations  0  SIRS WBC 0  SIRS Score Sum  1

## 2021-07-04 NOTE — Assessment & Plan Note (Signed)
Resolved, magnesium of 2.0. -Replete potassium as needed -Continue to monitor

## 2021-07-04 NOTE — Assessment & Plan Note (Signed)
Patient met sepsis criteria with fever, tachycardia and tachypnea.  UA concerning for UTI.  Initial blood cultures with Streptococcus anginosus, history of recent esophageal dilatation, had total of 82 so far.  Patient is already on preferred antibiotic of ceftriaxone.  Urine cultures with E. Coli and good sensitivity Remained afebrile over the past 24 hours -Continue ceftriaxone -Continue supportive care -Follow-up cultures -Echocardiogram with normal EF and grade 1 diastolic dysfunction, no other significant abnormality noted.  We will avoid TEE due to history of esophageal cancer and multiple strictures requiring dilatations.

## 2021-07-04 NOTE — Assessment & Plan Note (Signed)
Patient has a recent esophageal dilatation at [facility].  History of multiple esophageal dilations in the past.  CTA chest with concern of esophagitis. Might have post dilatation bacteremia. -Continue with Protonix

## 2021-07-04 NOTE — TOC Progression Note (Signed)
Transition of Care [facility]) - Progression Note    Patient Details  Name: Andrew Mullins MRN: [id number] Date of Birth: 09/06/1955  Transition of [facility]) CM/SW Contact  Laurena Slimmer, RN Phone Number: 07/04/2021, 11:47 AM  Clinical Narrative:    Referral for home health accepted by Floydene Flock of [place].    Expected Discharge Plan: [place] Barriers to Discharge: Continued Medical Work up  Expected Discharge Plan and Services Expected Discharge Plan: [place]   Discharge Planning Services: CM Consult   Living arrangements for the past 2 months: Single Family Home                 DME Arranged: N/A DME Agency: NA       HH Arranged: PT, RN [place] Agency: [place] ([facility]) Date HH Agency Contacted: 07/03/21 Time [place]: 1238 Representative spoke with at [place]: [place] ([place]) Interventions    Readmission Risk Interventions     No data to display

## 2021-07-04 NOTE — Progress Notes (Signed)
Progress Note   Patient: Andrew Mullins FHQ:[id number] DOB: 02-18-1955 DOA: 06/28/2021     5 DOS: the patient was seen and examined on 07/04/2021   Brief hospital course: Taken from H&P.  Myking Sar is a 66 y.o. male with a known history of CVA with residual weakness, prostate CA, HTN, GERD< esophageal cancer  presents to the emergency department for evaluation of shaking chills and fever.  He was seen in our ER two days ago for symptoms of a pinched nerve in his back that started after doing work in the yard.  He was given muscle relaxant, lidocaine patch and discharged home.  He returns today with complaint of confusion and weakness. Has  been taking morphine 22m PO daily for help with back pain.    Patient denies  weakness, dizziness, chest pain, shortness of breath, N/V/C/D, abdominal pain, dysuria/frequency, changes in mental status.   ED course.  On arrival to ED he was febrile at 102, met sepsis criteria with fever, tachycardia and tachypnea.  Borderline soft blood pressure which improved with IV fluid.  Patient received broad-spectrum antibiotics per sepsis protocol in ED which include cefepime, Flagyl and vancomycin.  UA was positive for nitrites and leukocytes.  He was continued on ceftriaxone and admitted for sepsis secondary to UTI.  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  Preliminary blood cultures with Streptococcus species in anaerobic bottles.  Urine cultures were ordered as an add-on. Patient continued to experience significant back pain. Multilevel lumbar impingement was noted on CTA of chest, abdomen and pelvis done on 06/27/2021. Patient is now complaining of more pain between the shoulder blade, radiating to the both shoulders, no tingling and numbness or focal weakness. Ordered MRI thoracic and lumbar spine. Neurosurgery was consulted.  Off note: [facility]  CTA chest with some concern of  esophagitis.  Might have bacteremia secondary to esophageal dilatation. ID was also consulted.  6/16: All blood cultures growing Streptococcus, most likely strep viridans secondary to recent esophageal dilatation.  Preliminary urine cultures with E. Coli.  ID is on board.  MRI of thoracic spine with concern of discitis and abscess starting from T1, recommending cervical MRI which was ordered but will not be able to obtain it until 24-hour before the prior images.  Procalcitonin markedly elevated.  Echocardiogram ordered. Neurosurgery to determine whether they can drain that epidural abscess, recommending conservative management at this time. Echocardiogram ordered-Will avoid TEE due to his history of esophageal disease. Repeat blood cultures on Sunday-if remain negative for 48 hours then PICC line will be placed Continue to have significant upper back pain. GI was also consulted to rule out perforation although prior imaging was negative, Dr. AVicente Maleswill see the patient by Monday.  Patient does not want to see any other gastroenterologist.  6/17: Patient developed another febrile episode at 102 this morning, procalcitonin improving at 15.81 today.  Rest of the labs seems stable with mild hyponatremia and hypokalemia.  Repeat urine cultures with E. coli, only resistant to ampicillin and sulbactam.  Blood cultures with Streptococcus anginosus-pending susceptibility. MRI cervical spine done this morning with osteomyelitis discitis involving T1-T2, extension of phlegmon/abscess up to C4-C5 level.  Associated moderate spinal stenosis. Continues to have 10 out of 10 pain which interferes with all the activities and sleep.  Added MS Contin does not seem working at this time.  He was using all of his Dilaudid. Talked with Dr. YCari Caraway  and they might take him to the OR for washout tomorrow if he remained febrile and continued to have pain.  Patient was made n.p.o. after midnight .  6/18: Patient afebrile this  morning, had 2 episodes of fever with maximum of 102.4 yesterday. Ordered repeat blood culture.  Prior blood cultures positive for Streptococcus anginosus with good sensitivity.  Going to the OR with neurosurgery later today for washout. Echocardiogram with normal EF and grade 1 diastolic dysfunction and no mention of any valvular disease.  6/19: Pain seems improving after the procedure Patient underwent T1-2 Hemilaminectomy for washout of epidural abscess with neurosurgery on 07/02/2021.  Drain was placed and will remain in and is being managed by neurosurgery.  Preliminary wound cultures with no organisms yet.  Remained afebrile over the past 24 hours.  Repeat blood cultures done on 6/18 are still pending. Patient with complicated history of recurrent esophageal strictures requiring multiple dilatations, per patient he has been dilated 82 times.  GI was also consulted, per GI if he developed dysphagia again which is anticipated in the next couple of weeks, will need to follow-up with [facility] GI and might need prophylactic antibiotics.  6/20: Patient remained stable.  Blood pressure elevated-IV labetalol as needed added.  Noted to have allergies to hydralazine.  Intraoperative cultures remain negative in 24 hours, repeat blood cultures from 6/18 remain negative.  Wound drain was removed by neurosurgery.  Worsening of pain today, involved palliative care for pain management.   Assessment and Plan: * Sepsis with Streptococcus bacteremia Patient met sepsis criteria with fever, tachycardia and tachypnea.  UA concerning for UTI.  Initial blood cultures with Streptococcus anginosus, history of recent esophageal dilatation, had total of 82 so far.  Patient is already on preferred antibiotic of ceftriaxone.  Urine cultures with E. Coli and good sensitivity Remained afebrile over the past 24 hours -Continue ceftriaxone -Continue supportive care -Follow-up cultures -Echocardiogram with normal EF and grade 1  diastolic dysfunction, no other significant abnormality noted.  We will avoid TEE due to history of esophageal cancer and multiple strictures requiring dilatations.  E. coli UTI Urine cultures growing E. Coli with good sensitivity.  Patient is already on ceftriaxone for Streptococcus bacteremia.  Only resistant to ampicillin and sulbactam. -Continue ceftriaxone   Discitis/osteomyelitis S/p hemilaminectomy involving T1-T2 for washout of epidural abscess on 07/02/2021.  Pain worsened after some improvement today.  Drain was removed by neurosurgery today. MRI of thoracic and lumbar spine with concerning of discitis/osteomyelitis with some abscess involving T1-T5, cervical spine MRI cervical spine with no osteomyelitis involving the cervical spine but extension of phlegmon/abscess up to C4-5.   -Continue with ceftriaxone -Repeat blood cultures obtained on 07/02/2021- negative so far -Follow-up final culture results -Continue with pain management-MS Contin was added with Dilaudid to decrease the need of as needed meds.  Thrombocytopenia ([place]) Platelets pretty much stable with some variations. -Keep holding aspirin and Lovenox, we will restart if continue to improve -Continue to monitor  Hypokalemia Resolved, magnesium of 2.0. -Replete potassium as needed -Continue to monitor  CVA (cerebral vascular accident) [facility]) Patient has an history of CVA.  No acute concern. -Continue home dose of Lipitor -Holding home aspirin due to thrombocytopenia  Essential hypertension Blood pressure mildly elevated, most likely pain is playing a role. 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  History  of multiple esophageal dilations in the past.  CTA chest with concern of esophagitis. Might have post dilatation  bacteremia. -Continue with Protonix   Subjective: Patient was complaining of worsening upper back pain today.  Physical Exam: Vitals:   07/03/21 2047 07/04/21 0537 07/04/21 0736 07/04/21 0900  BP: 138/75 (!) 155/80 (!) 187/89 (!) 177/86  Pulse: 75 67 72 70  Resp: _0 Temp: 98.1 F (36.7 C) 97.9 F (36.6 C) 97.7 F (36.5 C)   TempSrc: Oral     SpO2: 97% 98% 98%   Weight:      Height:       General.  Well-developed gentleman, in no acute distress. Pulmonary.  Lungs clear bilaterally, normal respiratory effort. CV.  Regular rate and rhythm, no JVD, rub or murmur. Abdomen.  Soft, nontender, nondistended, BS positive. CNS.  Alert and oriented .  No focal neurologic deficit. Extremities.  No edema, no cyanosis, pulses intact and symmetrical. Psychiatry.  Judgment and insight appears normal.  Data Reviewed: Prior data reviewed  Family Communication: Discussed with wife at bedside  Disposition: Status is: Inpatient Remains inpatient appropriate because: Severity of illness   Planned Discharge Destination: Home with Home Health  DVT prophylaxis.  Lovenox Time spent: 45 minutes  This record has been created using Systems analyst. Errors have been sought and corrected,but may not always be located. Such creation errors do not reflect on the standard of care.  Author: Lorella Nimrod, MD 07/04/2021 4:14 PM  For on call review [email or web address].

## 2021-07-04 NOTE — Plan of Care (Signed)

## 2021-07-04 NOTE — Assessment & Plan Note (Signed)
S/p hemilaminectomy involving T1-T2 for washout of epidural abscess on 07/02/2021.  Pain worsened after some improvement today.  Drain was removed by neurosurgery today. MRI of thoracic and lumbar spine with concerning of discitis/osteomyelitis with some abscess involving T1-T5, cervical spine MRI cervical spine with no osteomyelitis involving the cervical spine but extension of phlegmon/abscess up to C4-5.   -Continue with ceftriaxone -Repeat blood cultures obtained on 07/02/2021- negative so far -Follow-up final culture results -Continue with pain management-MS Contin was added with Dilaudid to decrease the need of as needed meds.

## 2021-07-04 NOTE — Assessment & Plan Note (Signed)
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

## 2021-07-04 NOTE — Progress Notes (Signed)
    Attending Progress Note  History: Andrew Mullins is s/p T1-2 hemilaminectomy for washout of epidural abscess.   POD2: Pt reporting increased interscapular pain this morning. Improved some with ice but still severe.   POD1: Improved pain this morning. No acute events overnight  Physical Exam: Vitals:   07/03/21 2047 07/04/21 0537  BP: 138/75 (!) 155/80  Pulse: 75 67  Resp: 20 20  Temp: 98.1 F (36.7 C) 97.9 F (36.6 C)  SpO2: 97% 98%    AA Ox3 CNI  Strength:5/5 throughout  HV 39m  Data:  Recent Labs  Lab 06/30/21 0700 07/01/21 0510 07/04/21 0456  NA 128* 127* 131*  K 3.3* 3.4* 3.6  CL 96* 92* 93*  CO2 '26 27 30  '$ BUN '19 14 20  '$ CREATININE 0.68 0.62 0.76  GLUCOSE 110* 158* 132*  CALCIUM 8.2* 8.4* 8.6*    Recent Labs  Lab 06/28/21 2151  AST 36  ALT 19  ALKPHOS 57      Recent Labs  Lab 06/30/21 0700 07/01/21 0510 07/04/21 0456  WBC 6.1 7.2 9.4  HGB 10.1* 11.7* 10.6*  HCT 30.2* 34.8* 31.2*  PLT 100* 95* 187    Recent Labs  Lab 06/29/21 0300  APTT 44*  INR 1.3*          Other tests/results: cultures and gram stain NTD   Assessment/Plan:  Andrew ZINDAis a 66y.o presenting with intrascapular pain found to have a thoracic epidural abscess  - mobilize - pain control - ok for DVT prophylaxis - PTOT - HV removed this morning - remainder of care per ID and internal medicine recommendations  DCooper RenderPA-C Department of Neurosurgery

## 2021-07-05 ENCOUNTER — Inpatient Hospital Stay: Payer: Medicare Other

## 2021-07-05 DIAGNOSIS — B9620 Unspecified Escherichia coli [E. coli] as the cause of diseases classified elsewhere: Secondary | ICD-10-CM

## 2021-07-05 DIAGNOSIS — R7881 Bacteremia: Secondary | ICD-10-CM | POA: Diagnosis not present

## 2021-07-05 DIAGNOSIS — I10 Essential (primary) hypertension: Secondary | ICD-10-CM

## 2021-07-05 DIAGNOSIS — K222 Esophageal obstruction: Secondary | ICD-10-CM | POA: Diagnosis not present

## 2021-07-05 DIAGNOSIS — M546 Pain in thoracic spine: Secondary | ICD-10-CM | POA: Diagnosis not present

## 2021-07-05 DIAGNOSIS — B955 Unspecified streptococcus as the cause of diseases classified elsewhere: Secondary | ICD-10-CM | POA: Diagnosis not present

## 2021-07-05 DIAGNOSIS — A419 Sepsis, unspecified organism: Secondary | ICD-10-CM | POA: Diagnosis not present

## 2021-07-05 DIAGNOSIS — M4644 Discitis, unspecified, thoracic region: Secondary | ICD-10-CM | POA: Diagnosis not present

## 2021-07-05 DIAGNOSIS — N390 Urinary tract infection, site not specified: Secondary | ICD-10-CM | POA: Diagnosis not present

## 2021-07-05 MED ORDER — IOHEXOL 300 MG/ML  SOLN
150.0000 mL | Freq: Once | INTRAMUSCULAR | Status: AC | PRN
  Administered 2021-07-05: 150 mL

## 2021-07-05 MED ORDER — ACETAMINOPHEN 325 MG PO TABS
650.0000 mg | ORAL_TABLET | Freq: Three times a day (TID) | ORAL | Status: DC
  Administered 2021-07-05 – 2021-07-07 (×6): 650 mg via ORAL
  Filled 2021-07-05 (×6): qty 2

## 2021-07-05 MED ORDER — ACETAMINOPHEN 650 MG RE SUPP: 650.0000 mg | Freq: Three times a day (TID) | RECTAL | Status: DC

## 2021-07-05 NOTE — Care Management Important Message (Signed)
Important Message  Patient Details  Name: Andrew Mullins MRN: [id number] Date of Birth: May 18, 1955   Medicare Important Message Given:  Yes     Juliann Pulse A [name] 07/05/2021, 2:55 PM

## 2021-07-05 NOTE — Progress Notes (Signed)
Palliative Care Progress Note, Assessment & Plan   Patient Name: Andrew Mullins       Date: 07/05/2021 DOB: January 07, 1956  Age: 66 y.o. MRN#: [id number] Attending Physician: Gwynne Edinger, MD Primary Care Physician: Baxter Hire, MD Admit Date: 06/28/2021  Reason for Consultation/Follow-up: Establishing goals of care  Subjective: Patient is sitting up in bed in no apparent distress.  He acknowledges my presence and is able to make his wishes known.  His wife is at bedside.  Patient has no acute complaints.  He endorses that he woke up about 330 this morning and has not felt any significant pain since that time.  HPI: 66 y.o. 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    Patient was seen in ER 2 days ago for symptoms of a pinched nerve in his back status post yardwork.  Patient was given a muscle relaxant, lidocaine patch, and discharged home.  Patient returned and was admitted on on 06/28/2021 with confusion, weakness, shaking and chills.     On arrival to ED, patient was treated with sepsis protocol.  UA was positive for nitrites and leukocytes.     Approximately 2 weeks ago, patient underwent esophageal dilatation at [facility].  CT of chest with some concern of esophagitis.  Patient may have bacteremia secondary to esophageal dilatation.   During this 5-day hospitalization patient has had febrile episodes with elevated procalcitonin's.  Urine cultures grew E. coli resistant to ampicillin and sulbactam.  Blood cultures revealed Streptococcus anginosus with good susceptibility.  Echocardiogram  on 618 revealed grade 1 diastolic dysfunction with normal EF and no mention of any valvular disease.   6/18, pt underwent T1-2 hemilaminectomy for washout of epidural abscess.  Drain was placed and will remain in with management by neurosurgery.  Blood cultures were repeated and still pending.   GI (Dr. Vicente Males at patient's request), ID and PMT were consulted. PMT was asked to be part of care team to help manage patient's pain.   Summary of counseling/coordination of care: After reviewing the patient's chart and assessing the patient at bedside, met with patient and his wife Lucinda.  Patient endorses he is so grateful that his pain is better managed.  He cannot recall specifics or details of what occurred yesterday.  He remembers writhing in pain and feeling cold but cannot recall many other details of the day.  Reviewed that medication regimen has been adjusted to have extended release morphine as well as p.o. Dilaudid.  Educated patient on use of p.o.'s as they 1) can be taken at home and 2) provide more continuous coverage versus IV medications.  Discussed with patient use of Tylenol with other medications on board as a synergistic effect.  Patient was accepting and willing to add Tylenol as a scheduled medication. Adjustment made to [facility] and RN notified.  A brief life review of the patient was had.  Patient is originally from [place] and is a Network engineer at [employer].  He enjoys painting large abstract pieces.  His wife is  also a professor of architecture at [employer].  They have 95 dog, a 60 year old golden retriever named [employer].  Both patient and wife speak of being able to return retire to somewhere like [place] where they can unplug and enjoy a more traditional sense of community.  I attempted to elicit goals important to the patient.  Patient shares that he does not want to have significant pain once he is up and moving.  He recalls feeling lightheaded, nauseous and having some unsteadiness  when he received chemo/radiation for his esophageal cancer.  He is concerned not only with the pain but that he will not have "his head about him" once he stands and moves.  Encourage patient to call and ask for assistance when moving out of bed.   Discussed disposition and use of home health and outpatient palliative services.  Patient was excepting of outpatient palliative referral.  TOC referral placed.  Questions and concerns were addressed.  Patient and wife have PMT contact info and were encouraged to call with any future palliative questions or concerns.  I am on service tomorrow and plan on rounding on the patient to ensure pain regimen is appropriate.  Code Status: Full code  Prognosis: Unable to determine  Discharge Planning: Home with Palliative Services  Care plan was discussed with patient, patient's wife Macky Lower, RN Denice Paradise  Physical Exam Vitals reviewed.  Constitutional:      General: He is not in acute distress.    Appearance: Normal appearance. He is normal weight. He is not ill-appearing, toxic-appearing or diaphoretic.  HENT:     Head: Normocephalic and atraumatic.     Mouth/Throat:     Mouth: Mucous membranes are moist.  Eyes:     Pupils: Pupils are equal, round, and reactive to light.  Cardiovascular:     Rate and Rhythm: Normal rate.     Pulses: Normal pulses.  Pulmonary:     Effort: Pulmonary effort is normal.  Abdominal:     Palpations: Abdomen is soft.  Musculoskeletal:        General: Normal range of motion.  Skin:    General: Skin is warm and dry.  Neurological:     Mental Status: He is alert and oriented to person, place, and time.  Psychiatric:        Mood and Affect: Mood normal.        Behavior: Behavior normal.        Thought Content: Thought content normal.            Total Time 50 minutes  Greater than 50%  of this time was spent counseling and coordinating care related to the above assessment and plan.  Thank you for allowing the  Palliative Medicine Team to assist in the care of this patient.  Waldo Ilsa Iha, FNP-BC Palliative Medicine Team Team Phone # [phone]

## 2021-07-05 NOTE — TOC Progression Note (Signed)
Transition of Care [facility]) - Progression Note    Patient Details  Name: Andrew Mullins MRN: [id number] Date of Birth: Oct 17, 1955  Transition of Care [facility]) CM/SW Contact  Eileen Stanford, LCSW Phone Number: 07/05/2021, 3:06 PM  Clinical Narrative:   [facility] is aware of pt needing home iv antibiotics and will do teaching prior to dc. [facility] will provide hh services at dc.    Expected Discharge Plan: [place] Barriers to Discharge: Continued Medical Work up  Expected Discharge Plan and Services Expected Discharge Plan: [place]   Discharge Planning Services: CM Consult   Living arrangements for the past 2 months: Single Family Home                 DME Arranged: N/A DME Agency: NA       HH Arranged: PT, RN [place] Agency: [place] ([facility]) Date HH Agency Contacted: 07/03/21 Time [place]: 1238 Representative spoke with at [place]: [place] ([place]) Interventions    Readmission Risk Interventions     No data to display

## 2021-07-05 NOTE — Progress Notes (Signed)
    Attending Progress Note  History: Andrew Mullins is s/p T1-2 hemilaminectomy for washout of epidural abscess.   POD3: Improved pain this morning.   POD2: Pt reporting increased interscapular pain this morning. Improved some with ice but still severe.   POD1: Improved pain this morning. No acute events overnight  Physical Exam: Vitals:   07/04/21 2040 07/05/21 0409  BP: 115/63 (!) 150/81  Pulse: 69 64  Resp: 18 16  Temp: 97.8 F (36.6 C) 98.6 F (37 C)  SpO2: 97% 97%    AA Ox3 CNI  Strength:5/5 throughout   Data:  Recent Labs  Lab 06/30/21 0700 07/01/21 0510 07/04/21 0456  NA 128* 127* 131*  K 3.3* 3.4* 3.6  CL 96* 92* 93*  CO2 '26 27 30  '$ BUN '19 14 20  '$ CREATININE 0.68 0.62 0.76  GLUCOSE 110* 158* 132*  CALCIUM 8.2* 8.4* 8.6*    Recent Labs  Lab 06/28/21 2151  AST 36  ALT 19  ALKPHOS 57      Recent Labs  Lab 06/30/21 0700 07/01/21 0510 07/04/21 0456  WBC 6.1 7.2 9.4  HGB 10.1* 11.7* 10.6*  HCT 30.2* 34.8* 31.2*  PLT 100* 95* 187    Recent Labs  Lab 06/29/21 0300  APTT 44*  INR 1.3*          Other tests/results: cultures and gram stain NTD   Assessment/Plan:  Andrew Mullins is a 66 y.o presenting with intrascapular pain found to have a thoracic epidural abscess  - mobilize - pain control - ok for DVT prophylaxis - PTOT - HV removed t - remainder of care per ID and internal medicine recommendations - will follow up outpatient for wound check 2-3 weeks post-op.   Cooper Render PA-C Department of Neurosurgery

## 2021-07-05 NOTE — Progress Notes (Signed)
Progress Note   Patient: Andrew Mullins YOV:[id number] DOB: 12-Oct-1955 DOA: 06/28/2021     6 DOS: the patient was seen and examined on 07/05/2021   Brief hospital course: Andrew Mullins is a 66 y.o. male with a known history of CVA with residual weakness, prostate CA, HTN, GERD< esophageal cancer  presents to the emergency department for evaluation of shaking chills and fever.  He was seen in our ER two days ago for symptoms of a pinched nerve in his back that started after doing work in the yard.  He was given muscle relaxant, lidocaine patch and discharged home.  He returns today with complaint of confusion and weakness. Has  been taking morphine 77m PO daily for help with back pain.    Patient denies  weakness, dizziness, chest pain, shortness of breath, N/V/C/D, abdominal pain, dysuria/frequency, changes in mental status.    Assessment and Plan: Sepsis with Streptococcus bacteremia Patient met sepsis criteria with fever, tachycardia and tachypnea.  UA concerning for UTI.  Initial blood cultures with Streptococcus anginosus, history of recent esophageal dilatation, had total of 82 so far.  Patient is already on preferred antibiotic of ceftriaxone.  Urine cultures with E. Coli and good sensitivity. Remained afebrile over the past 24 hours. Repeat blood cultures from 6/20 ngtd -Continue ceftriaxone -Continue supportive care -Follow-up cultures - ID following -Echocardiogram with normal EF and grade 1 diastolic dysfunction, no other significant abnormality noted.  We will avoid TEE due to history of esophageal cancer and multiple strictures requiring dilatations.  E. coli UTI Urine cultures growing E. Coli with good sensitivity.  Patient is already on ceftriaxone for Streptococcus bacteremia.  Only resistant to ampicillin and sulbactam. -Continue ceftriaxone  Discitis/osteomyelitis S/p hemilaminectomy involving T1-T2 for washout of epidural abscess on 07/02/2021.  Pain improved today.   Drain was removed by neurosurgery. MRI of thoracic and lumbar spine with concerning of discitis/osteomyelitis with some abscess involving T1-T5, cervical spine MRI cervical spine with no osteomyelitis involving the cervical spine but extension of phlegmon/abscess up to C4-5.   -Continue with ceftriaxone -Repeat blood cultures obtained on 07/02/2021- negative so far -Follow-up final culture results -Continue with pain management-MS Contin was added with Dilaudid to decrease the need of as needed meds. - PT/OT consulted  Thrombocytopenia (HCC) Resolved  Hypokalemia Resolved, magnesium of 2.0. -Replete potassium as needed -Continue to monitor  CVA (cerebral vascular accident) ([facility] Patient has an history of CVA.  No acute concern. -Continue home dose of Lipitor -Holding home aspirin due to thrombocytopenia  Essential hypertension Blood pressure mildly elevated, most likely pain is playing a role. 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  CTA chest with concern of esophagitis. Might have post dilatation bacteremia. -Continue with Protonix - f/u results of esophogram today    Subjective: pain much improved  Physical Exam: Vitals:   07/04/21 1735 07/04/21 2040 07/05/21 0409 07/05/21 0810  BP: (!) 90/57 115/63 (!) 150/81 (!) 154/75  Pulse: 80 69 64 75  Resp: _0 Temp: 98.9 F (37.2 C) 97.8 F (36.6 C) 98.6 F (37 C) 98.4 F (36.9 C)  TempSrc:  Oral    SpO2: 94% 97% 97% 97%  Weight:      Height:       General.  Well-developed gentleman,  in no acute distress. Pulmonary.  Lungs clear bilaterally, normal respiratory effort. CV.  Regular rate and rhythm, no JVD, rub or murmur. Abdomen.  Soft, nontender, nondistended, BS  positive. Skin back surgical incision c/d/i CNS.  Alert and oriented .  No focal neurologic deficit. Extremities.  No edema, no cyanosis, pulses intact and symmetrical. Psychiatry.  Judgment and insight appears normal.  Data Reviewed: Prior data reviewed  Family Communication: none @ bedside, declines my offer to call wife  Disposition: Status is: Inpatient Remains inpatient appropriate because: ongoing w/u   Planned Discharge Destination: Home with Home Health  DVT prophylaxis.  Lovenox Time spent: 45 minutes  This record has been created using Systems analyst. Errors have been sought and corrected,but may not always be located. Such creation errors do not reflect on the standard of care.  Author: Desma Maxim, MD 07/05/2021 3:57 PM  For on call review [email or web address].
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
## 2021-07-05 NOTE — Discharge Instructions (Addendum)
NEUROSURGERY DISCHARGE INSTRUCTIONS  Admission diagnosis: Sepsis secondary to UTI ([place]) [A41.9, N39.0]  Operative procedure: T1-2 hemilaminectomy for washout of epidural abscess.   What to do after you leave the hospital:  Increase protein intake to promote wound healing.  Recommended activity: no lifting, driving, or strenuous exercise for 4 weeks . You should walk multiple times per day  Special Instructions  No straining, no heavy lifting > 10lbs x 4 weeks.  Keep incision area clean and dry. May shower in 2 days. No baths or pools for 6 weeks.  Please remove dressing tomorrow, no need to apply a bandage afterwards  You have no sutures to remove, the skin is closed with adhesive  Please take pain medications as directed. Take a stool softener if on pain medications   Please Report any of the following: Nausea or Vomiting, Temperature is greater than 101.34F (38.1C) degrees, Dizziness, Abdominal Pain, Difficulty Breathing or Shortness of Breath, Inability to Eat, drink Fluids, or Take medications, Bleeding, swelling, or drainage from surgical incision sites, New numbness or weakness, and Bowel or bladder dysfunction to the neurosurgeon on call at [phone]  Additional Follow up appointments Please follow up with Cooper Render PA-C in [place] clinic as scheduled in 2-3 weeks   Please see below for scheduled appointments:  No future appointments.

## 2021-07-06 ENCOUNTER — Inpatient Hospital Stay: Payer: Self-pay

## 2021-07-06 DIAGNOSIS — A419 Sepsis, unspecified organism: Secondary | ICD-10-CM | POA: Diagnosis not present

## 2021-07-06 DIAGNOSIS — K222 Esophageal obstruction: Secondary | ICD-10-CM | POA: Diagnosis not present

## 2021-07-06 DIAGNOSIS — M546 Pain in thoracic spine: Secondary | ICD-10-CM | POA: Diagnosis not present

## 2021-07-06 DIAGNOSIS — N390 Urinary tract infection, site not specified: Secondary | ICD-10-CM | POA: Diagnosis not present

## 2021-07-06 DIAGNOSIS — E876 Hypokalemia: Secondary | ICD-10-CM

## 2021-07-06 LAB — CBC
HCT: 33.9 % — ABNORMAL LOW (ref 39.0–52.0)
Hemoglobin: 11.6 g/dL — ABNORMAL LOW (ref 13.0–17.0)
MCH: 27.9 pg (ref 26.0–34.0)
MCHC: 34.2 g/dL (ref 30.0–36.0)
MCV: 81.5 fL (ref 80.0–100.0)
Platelets: 272 10*3/uL (ref 150–400)
RBC: 4.16 MIL/uL — ABNORMAL LOW (ref 4.22–5.81)
RDW: 13.1 % (ref 11.5–15.5)
WBC: 11.7 10*3/uL — ABNORMAL HIGH (ref 4.0–10.5)
nRBC: 0 % (ref 0.0–0.2)

## 2021-07-06 LAB — BLOOD CULTURE ID PANEL (REFLEXED) - BCID2

## 2021-07-06 LAB — BASIC METABOLIC PANEL
Anion gap: 8 (ref 5–15)
BUN: 19 mg/dL (ref 8–23)
CO2: 29 mmol/L (ref 22–32)
Calcium: 8.9 mg/dL (ref 8.9–10.3)
Chloride: 93 mmol/L — ABNORMAL LOW (ref 98–111)
Creatinine, Ser: 0.75 mg/dL (ref 0.61–1.24)
GFR, Estimated: >60 mL/min (ref >60)
Glucose, Bld: 109 mg/dL — ABNORMAL HIGH (ref 70–99)
Potassium: 3.7 mmol/L (ref 3.5–5.1)
Sodium: 130 mmol/L — ABNORMAL LOW (ref 135–145)

## 2021-07-06 MED ORDER — SODIUM CHLORIDE 0.9 % IV SOLN: 2.0000 g | Freq: Two times a day (BID) | INTRAVENOUS | Status: DC

## 2021-07-06 MED ORDER — SODIUM CHLORIDE 0.9 % IV SOLN
2.0000 g | Freq: Two times a day (BID) | INTRAVENOUS | Status: DC
  Administered 2021-07-06 – 2021-07-12 (×13): 2 g via INTRAVENOUS
  Filled 2021-07-06 (×9): qty 2
  Filled 2021-07-06: qty 20
  Filled 2021-07-06 (×3): qty 2

## 2021-07-06 MED ORDER — HYDROMORPHONE HCL 2 MG PO TABS
2.0000 mg | ORAL_TABLET | Freq: Four times a day (QID) | ORAL | Status: DC
  Administered 2021-07-06 – 2021-07-07 (×3): 2 mg via ORAL
  Filled 2021-07-06 (×3): qty 1

## 2021-07-06 MED ORDER — CHLORHEXIDINE GLUCONATE CLOTH 2 % EX PADS
6.0000 | MEDICATED_PAD | Freq: Every day | CUTANEOUS | Status: DC
Start: 2021-07-06 — End: 2021-07-12
  Administered 2021-07-07 – 2021-07-12 (×6): 6 via TOPICAL

## 2021-07-06 MED ORDER — SODIUM CHLORIDE 0.9% FLUSH: 10.0000 mL | INTRAVENOUS | Status: DC | PRN

## 2021-07-06 MED ORDER — SODIUM CHLORIDE 0.9% FLUSH
10.0000 mL | Freq: Two times a day (BID) | INTRAVENOUS | Status: DC
  Administered 2021-07-06 – 2021-07-12 (×10): 10 mL

## 2021-07-06 NOTE — Progress Notes (Signed)
Palliative Care Progress Note   Patient Name: Andrew Mullins       Date: 07/06/2021 DOB: [birth year]  Age: 66 y.o. MRN#: [id number] Attending Physician: Gwynne Edinger, MD Primary Care Physician: Baxter Hire, MD Admit Date: 06/28/2021  HPI: 66 y.o. 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    Patient was seen in ER 2 days ago for symptoms of a pinched nerve in his back status post yardwork.  Patient was given a muscle relaxant, lidocaine patch, and discharged home.  Patient returned and was admitted on on 06/28/2021 with confusion, weakness, shaking and chills.     On arrival to ED, patient was treated with sepsis protocol.  UA was positive for nitrites and leukocytes.     Approximately 2 weeks ago, patient underwent esophageal dilatation at [facility].  CT of chest with some concern of esophagitis.  Patient may have bacteremia secondary to esophageal dilatation.   During this 5-day hospitalization patient has had febrile episodes with elevated procalcitonin's.  Urine cultures grew E. coli resistant to ampicillin and sulbactam.  Blood cultures revealed Streptococcus anginosus with good susceptibility.  Echocardiogram on 618 revealed grade 1 diastolic dysfunction with normal EF and no mention of any valvular disease.   6/18, pt underwent T1-2 hemilaminectomy for washout of epidural abscess.  Drain was placed and will remain in with management by neurosurgery.  Blood cultures were repeated and still pending.   GI (Dr. Vicente Males at patient's request), ID and PMT were consulted. PMT was asked to be part  of care team to help manage patient's pain.  PE: Patient is sitting on edge of bed and working with PT.  He acknowledges my presence and is able to make his wishes known.  He endorses pain was not well controlled at 3 AM this morning.  Prior to 3 AM he was sleeping well with minimal pain.  Endorses that use of IV Dilaudid relieved his 10 out of 10 pain.  Wife Lucinda at bedside.  Impression:  After reviewing the patient's chart and assessing the patient at bedside, I spoke with patient in regards to pain regimen.  He endorses pain was well controlled with MS Contin and Dilaudid p.o.  Discussed that medication is likely wearing off since it is given 4 times daily.  Order changed to every 6 hours.  Patient and wife are in agreement for more consistent pain control.  Meds adjusted RN made aware.  As far as bowel regimen, patient has not had a stool in 4 days.  However, he has been consistently taking his Miralax.  He shares that he feels some movement and is passing gas.  He believes he will have a bowel movement today.  Discussed importance of bowel regimen in addition to pain control.  Questions and concerns were addressed.  PMT will continue to follow the patient throughout his hospitalization.  Thank you for allowing the Palliative Medicine Team to assist in the care of Andrew Mullins.  Total Time 35 minutes  Greater than 50%  of this time was spent counseling and coordinating care related to the above assessment and plan.  [name], FNP-BC Palliative Medicine Team Team Phone # [phone]

## 2021-07-06 NOTE — Progress Notes (Signed)
Progress Note   Patient: Andrew Mullins QVZ:[id number] DOB: 05-Feb-1955 DOA: 06/28/2021     7 DOS: the patient was seen and examined on 07/06/2021   Brief hospital course: Andrew Mullins is a 66 y.o. male with a known history of CVA with residual weakness, prostate CA, HTN, GERD< esophageal cancer  presents to the emergency department for evaluation of shaking chills and fever.  He was seen in our ER two days ago for symptoms of a pinched nerve in his back that started after doing work in the yard.  He was given muscle relaxant, lidocaine patch and discharged home.  He returns today with complaint of confusion and weakness. Has  been taking morphine $RemoveBeforeD'18mg'hEdseSqbDpgIMs$  PO daily for help with back pain.    Patient denies  weakness, dizziness, chest pain, shortness of breath, N/V/C/D, abdominal pain, dysuria/frequency, changes in mental status.    Assessment and Plan: Sepsis with Streptococcus bacteremia Patient met sepsis criteria with fever, tachycardia and tachypnea.  UA concerning for UTI.  Initial blood cultures with Streptococcus anginosus, history of recent esophageal dilatation, had total of 82 so far.  Patient is already on preferred antibiotic of ceftriaxone.  Urine cultures with E. Coli and good sensitivity. Remained afebrile over the past 24 hours. Repeat blood cultures from 6/20 ngtd -Continue ceftriaxone -Continue supportive care -Follow-up cultures - ID following -Echocardiogram with normal EF and grade 1 diastolic dysfunction, no other significant abnormality noted.  We will avoid TEE due to history of esophageal cancer and multiple strictures requiring dilatations.  E. coli UTI Urine cultures growing E. Coli with good sensitivity.  Patient is already on ceftriaxone for Streptococcus bacteremia.  Only resistant to ampicillin and sulbactam. -Continue ceftriaxone  Discitis/osteomyelitis S/p hemilaminectomy involving T1-T2 for washout of epidural abscess on 07/02/2021.  Pain improved today.   Drain was removed by neurosurgery. MRI of thoracic and lumbar spine with concerning of discitis/osteomyelitis with some abscess involving T1-T5, cervical spine MRI cervical spine with no osteomyelitis involving the cervical spine but extension of phlegmon/abscess up to C4-5.   -Continue with ceftriaxone -Repeat blood cultures obtained on 07/02/2021- negative so far -Follow-up final culture results -Continue with pain management-MS Contin was added with Dilaudid to decrease the need of as needed meds. - PT/OT consulted, advising home health  Thrombocytopenia ([place]) Resolved  Hypokalemia Resolved, magnesium of 2.0. -Replete potassium as needed -Continue to monitor  CVA (cerebral vascular accident) [facility]) Patient has an history of CVA.  No acute concern. -Continue home dose of Lipitor -Holding home aspirin due to thrombocytopenia  Essential hypertension Blood pressure mildly elevated, most likely pain is playing a role. 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  History of multiple esophageal dilations in the past.  CTA chest with concern of esophagitis. Might have post dilatation bacteremia. Esophogram no signs leak -Continue with Protonix   Subjective: pain much improved  Physical Exam: Vitals:   07/05/21 1604 07/05/21 2224 07/06/21 0454 07/06/21 0737  BP: (!) 154/83 (!) 148/88 135/90 (!) 163/93  Pulse: 75 69 71 75  Resp: $Remo'18 16 16   'fkfmh$ Temp: 98.4 F (36.9 C) 97.7 F (36.5 C) 98.2 F (36.8 C) 97.7 F (36.5 C)  TempSrc: Oral     SpO2: 99% 97% 96% 96%  Weight:      Height:       General.  Well-developed gentleman,  in no acute distress. Pulmonary.  Lungs clear bilaterally, normal respiratory effort. CV.  Regular rate and rhythm, no JVD, rub or murmur. Abdomen.  Soft, nontender,  nondistended, BS positive. Skin back surgical incision c/d/i CNS.  Alert and oriented .  No focal neurologic deficit. Extremities.  No edema, no cyanosis, pulses intact and symmetrical. Psychiatry.  Judgment and insight appears normal.  Data Reviewed: Prior data reviewed  Family Communication: wife updated telephonically 6/22  Disposition: Status is: Inpatient Remains inpatient appropriate because: ongoing w/u   Planned Discharge Destination: Home with Home Health  DVT prophylaxis.  Lovenox Time spent: 35 minutes   Author: Desma Maxim, MD 07/06/2021 2:09 PM  For on call review [email or web address].

## 2021-07-06 NOTE — Progress Notes (Signed)
Peripherally Inserted Central Catheter Placement  The IV Nurse has discussed with the patient and/or persons authorized to consent for the patient, the purpose of this procedure and the potential benefits and risks involved with this procedure.  The benefits include less needle sticks, lab draws from the catheter, and the patient may be discharged home with the catheter. Risks include, but not limited to, infection, bleeding, blood clot (thrombus formation), and puncture of an artery; nerve damage and irregular heartbeat and possibility to perform a PICC exchange if needed/ordered by physician.  Alternatives to this procedure were also discussed.  Bard Power PICC patient education guide, fact sheet on infection prevention and patient information card has been provided to patient /or left at bedside.    PICC Placement Documentation  PICC Single Lumen 16/10/96 Right Basilic 38 cm 0 cm (Active)  Indication for Insertion or Continuance of Line Home intravenous therapies (PICC only) 07/06/21 1808  Exposed Catheter (cm) 0 cm 07/06/21 1808  Site Assessment Clean, Dry, Intact 07/06/21 1808  Line Status Flushed;Saline locked;Blood return noted 07/06/21 1808  Dressing Type Transparent;Securing device 07/06/21 1808  Dressing Status Antimicrobial disc in place;Clean, Dry, Intact 07/06/21 1808  Safety Lock Not Applicable 04/54/09 8119  Dressing Intervention New dressing;Other (Comment) 07/06/21 1808  Dressing Change Due 07/13/21 07/06/21 1808       Enos Fling 07/06/2021, 6:10 PM

## 2021-07-06 NOTE — TOC Progression Note (Addendum)
Transition of Care [facility]) - Progression Note    Patient Details  Name: Andrew Mullins MRN: [id number] Date of Birth: 12/12/1955  [facility] of [facility]) CM/SW Contact  Eileen Stanford, LCSW Phone Number: 07/06/2021, 1:36 PM  Clinical Narrative:   Pam with [facility] aware of pt dc tomorrow. Pam will have to come do teaching with Wife around 3:00 today prior to dc . [facility] also aware pt is dc tomorrow. MD notified need [place] orders.    Expected Discharge Plan: [place] Barriers to Discharge: Continued Medical Work up  Expected Discharge Plan and Services Expected Discharge Plan: [place]   Discharge Planning Services: CM Consult   Living arrangements for the past 2 months: Single Family Home                 DME Arranged: N/A DME Agency: NA       HH Arranged: PT, RN [place] Agency: [place] ([facility]) Date HH Agency Contacted: 07/03/21 Time [place]: 1238 Representative spoke with at [place]: [place] ([place]) Interventions    Readmission Risk Interventions     No data to display

## 2021-07-06 NOTE — Treatment Plan (Signed)
Diagnosis: Streptococcus Anginosus bacteremia with  T1-T2 discitis Spinal epidural phlegmon/abscess Baseline Creatinine    Allergies  Allergen Reactions   Hydralazine Other (See Comments)    Flushing, anxiety-per [facility] Flushing, anxiety, shaking after procedure.    OPAT Orders Discharge antibiotics: Ceftriaxone 2 grams IV every 12 hours Duration: 6 weeks End Date:08/12/21   [facility] Care Per Protocol:inculding placement of biopatch  Labs weekly while on IV antibiotics: _X_ CBC with differential _X_ CMP _X_ CRP _X_ ESR   _X_ Please pull PIC at completion of IV antibiotics   Fax weekly labs to [phone]  Clinic Follow Up Appt: 07/25/21 at [place]   Call [phone] with any questions

## 2021-07-06 NOTE — Evaluation (Signed)
Occupational Therapy Evaluation Patient Details Name: Andrew Mullins MRN: [id number] DOB: [birth year] Today's Date: 07/06/2021   History of Present Illness Pt is a 66 y.o. male with PMH of CVA without residual weakness, prostate CA, HTN, GERD, esophageal cancer  presents to the emergency department for evaluation of shaking chills and fever. He was seen in ER 6/13 for symptoms of a pinched nerve in his back. He returned to ER 6/15 with complaint of confusion and weakness. Pt admitted for sepsis secondary to UTI. Pt underwent thoracic (T1-2) laminectomy for washout of epidural abscess on 6/18.   Clinical Impression   Andrew Mullins presents with generalized weakness, limited endurance, fatigue, impaired balance, and 5/10 upper back pain. Prior to admission, pt has been IND in all fxl mobility tasks, active at home and in community, driving, employed FT. During today's evaluation, he is able to perform grooming in standing at sink but has several small LOB episodes and tires quickly, needing to use 1 hand on sink cabinet for support. He performs toileting with CGA for safety due to slight LOB, he ambulates with and w/o RW with good balance, but, again, tiring quickly and displaying minor impaired balance. Provided educ re: back precautions. Pt already has necessary DME (RW and SPC) at home. Recommend HHOT after DC to assist pt in achieving a full recover and return to PLOF.    Recommendations for follow up therapy are one component of a multi-disciplinary discharge planning process, led by the attending physician.  Recommendations may be updated based on patient status, additional functional criteria and insurance authorization.   Follow Up Recommendations  Home health OT    Assistance Recommended at Discharge Intermittent Supervision/Assistance  Patient can return home with the following A little help with walking and/or transfers;A little help with bathing/dressing/bathroom    Functional Status  Assessment  Patient has had a recent decline in their functional status and demonstrates the ability to make significant improvements in function in a reasonable and predictable amount of time.  Equipment Recommendations  None recommended by OT    Recommendations for Other Services       Precautions / Restrictions Precautions Precautions: Back Precaution Booklet Issued: No Precaution Comments: No BLT, no lifting >10 lbs. per PA through secure chat Restrictions Weight Bearing Restrictions: No      Mobility Bed Mobility Overal bed mobility: Needs Assistance Bed Mobility: Rolling, Sidelying to Sit Rolling: Supervision Sidelying to sit: Supervision       General bed mobility comments: Pt required verbal cues for technique for maintaining back precautions    Transfers Overall transfer level: Needs assistance Equipment used: Rolling walker (2 wheels), None Transfers: Sit to/from Stand, Bed to chair/wheelchair/BSC Sit to Stand: Min guard     Step pivot transfers: Min guard     General transfer comment: w/ and w/o RW      Balance Overall balance assessment: Needs assistance Sitting-balance support: No upper extremity supported, Feet supported Sitting balance-Leahy Scale: Good Sitting balance - Comments: reports mild pain in sitting   Standing balance support: No upper extremity supported, Bilateral upper extremity supported, Single extremity supported Standing balance-Leahy Scale: Good Standing balance comment: several minor LOB episodes in standing, pt able to self-recover; fatigues after ~ 3 minutes standing at sink                           ADL either performed or assessed with clinical judgement   ADL Overall ADL's : Needs  assistance/impaired     Grooming: Wash/dry hands;Wash/dry face;Oral care;Applying deodorant;Min guard;Standing Grooming Details (indicate cue type and reason): standing at sink, minor LOB                 Toilet Transfer:  Min guard;Ambulation;Rolling walker (2 wheels)   Toileting- Clothing Manipulation and Hygiene: Supervision/safety;Sit to/from stand               Vision Patient Visual Report: No change from baseline       Perception     Praxis      Pertinent Vitals/Pain Pain Assessment Pain Assessment: 0-10 Pain Score: 5  Pain Location: Upper back; interscapular Pain Descriptors / Indicators: Sharp, Grimacing Pain Intervention(s): Repositioned, Monitored during session     Hand Dominance Right   Extremity/Trunk Assessment Upper Extremity Assessment Upper Extremity Assessment: Overall WFL for tasks assessed   Lower Extremity Assessment Lower Extremity Assessment: Overall WFL for tasks assessed   Cervical / Trunk Assessment Cervical / Trunk Assessment: Back Surgery   Communication Communication Communication: No difficulties   Cognition Arousal/Alertness: Awake/alert Behavior During Therapy: WFL for tasks assessed/performed Overall Cognitive Status: Within Functional Limits for tasks assessed                                 General Comments: Pt A&Ox4     General Comments       Exercises Other Exercises Other Exercises: Educ re: DC recs, back precautions, home modifications and AE   Shoulder Instructions      Home Living Family/patient expects to be discharged to:: Private residence Living Arrangements: Spouse/significant other Available Help at Discharge: Family;Available 24 hours/day Type of Home: House Home Access: Stairs to enter [employer] of Steps: 3 Entrance Stairs-Rails: Left Home Layout: One level     Bathroom Shower/Tub: Occupational psychologist: Standard     Home Equipment: Conservation officer, nature (2 wheels);Cane - single point   Additional Comments: Pt works as an Network engineer at [employer]; reported no falls over the past 6 months      Prior Functioning/Environment Prior Level of Function : Independent/Modified  Independent;Working/employed;Driving             Mobility Comments: Pt ambulated without AD ADLs Comments: IND in all ADL/IADL        OT Problem List: Decreased strength;Decreased activity tolerance;Decreased range of motion;Impaired balance (sitting and/or standing);Pain      OT Treatment/Interventions: Self-care/ADL training;Therapeutic exercise;Patient/family education;Balance training;Energy conservation;DME and/or AE instruction;Therapeutic activities    OT Goals(Current goals can be found in the care plan section) Acute Rehab OT Goals Patient Stated Goal: to be able to go hiking in [place] later this summer OT Goal Formulation: With patient Time For Goal Achievement: 07/20/21 Potential to Achieve Goals: Good ADL Goals Pt Will Perform Lower Body Dressing: Independently Pt Will Transfer to Toilet: Independently Pt Will Perform Tub/Shower Transfer: Independently  OT Frequency: Min 2X/week    Co-evaluation              AM-PAC OT "6 Clicks" Daily Activity     Outcome Measure Help from another person eating meals?: None Help from another person taking care of personal grooming?: A Little Help from another person toileting, which includes using toliet, bedpan, or urinal?: A Little Help from another person bathing (including washing, rinsing, drying)?: A Little Help from another person to put on and taking off regular upper body clothing?: A Little Help from  another person to put on and taking off regular lower body clothing?: A Little 6 Click Score: 19   End of Session Equipment Utilized During Treatment: Rolling walker (2 wheels)  Activity Tolerance: Patient tolerated treatment well Patient left: in chair;with call bell/phone within reach  OT Visit Diagnosis: Unsteadiness on feet (R26.81);Muscle weakness (generalized) (M62.81);Pain                Time: 1310-1330 OT Time Calculation (min): 20 min Charges:  OT General Charges $OT Visit: 1 Visit OT Evaluation $OT  Eval Low Complexity: 1 Low OT Treatments $Self Care/Home Management : 8-22 mins Josiah Lobo, PhD, MS, OTR/L 07/06/21, 1:45 PM

## 2021-07-06 NOTE — Progress Notes (Signed)
PHARMACY - PHYSICIAN COMMUNICATION CRITICAL VALUE ALERT - BLOOD CULTURE IDENTIFICATION (BCID)  Andrew Mullins is an 66 y.o. male who presented to [facility] on 06/28/2021 with a chief complaint of sepsis.   Assessment:  Gram negative diplococci in 1 of 4 bottles,  species not recognized.  (include suspected source if known)  Name of physician (or Provider) Contacted: Sharion Settler, NP   Current antibiotics: Ceftriaxone 2 gm IV Q12H.   Changes to prescribed antibiotics recommended:  Patient is on recommended antibiotics - No changes needed  Results for orders placed or performed during the hospital encounter of 06/28/21  Blood Culture ID Panel (Reflexed) (Collected: 07/04/2021  9:49 AM)  Result Value Ref Range   Enterococcus faecalis NOT DETECTED NOT DETECTED   Enterococcus Faecium NOT DETECTED NOT DETECTED   Listeria monocytogenes NOT DETECTED NOT DETECTED   Staphylococcus species NOT DETECTED NOT DETECTED   Staphylococcus aureus (BCID) NOT DETECTED NOT DETECTED   Staphylococcus epidermidis NOT DETECTED NOT DETECTED   Staphylococcus lugdunensis NOT DETECTED NOT DETECTED   Streptococcus species NOT DETECTED NOT DETECTED   Streptococcus agalactiae NOT DETECTED NOT DETECTED   Streptococcus pneumoniae NOT DETECTED NOT DETECTED   Streptococcus pyogenes NOT DETECTED NOT DETECTED   A.calcoaceticus-baumannii NOT DETECTED NOT DETECTED   Bacteroides fragilis NOT DETECTED NOT DETECTED   Enterobacterales NOT DETECTED NOT DETECTED   Enterobacter cloacae complex NOT DETECTED NOT DETECTED   Escherichia coli NOT DETECTED NOT DETECTED   Klebsiella aerogenes NOT DETECTED NOT DETECTED   Klebsiella oxytoca NOT DETECTED NOT DETECTED   Klebsiella pneumoniae NOT DETECTED NOT DETECTED   Proteus species NOT DETECTED NOT DETECTED   Salmonella species NOT DETECTED NOT DETECTED   Serratia marcescens NOT DETECTED NOT DETECTED   Haemophilus influenzae NOT DETECTED NOT DETECTED   Neisseria meningitidis  NOT DETECTED NOT DETECTED   Pseudomonas aeruginosa NOT DETECTED NOT DETECTED   Stenotrophomonas maltophilia NOT DETECTED NOT DETECTED   Candida albicans NOT DETECTED NOT DETECTED   Candida auris NOT DETECTED NOT DETECTED   Candida glabrata NOT DETECTED NOT DETECTED   Candida krusei NOT DETECTED NOT DETECTED   Candida parapsilosis NOT DETECTED NOT DETECTED   Candida tropicalis NOT DETECTED NOT DETECTED   Cryptococcus neoformans/gattii NOT DETECTED NOT DETECTED    Huda Petrey D 07/06/2021  10:05 PM

## 2021-07-06 NOTE — Progress Notes (Addendum)
PHARMACY CONSULT NOTE FOR:  OUTPATIENT  PARENTERAL ANTIBIOTIC THERAPY (OPAT)  Indication: Streptococcus Anginosus bacteremia with T1-T2 discitis spinal epidural phlegmon/abscess Regimen: Ceftriaxone 2gm IV q12h End date: 08/12/2021  Please pull PIC at completion of IV antibiotics Fax weekly labs to [phone]  IV antibiotic discharge orders are pended. To discharging provider:  please sign these orders via discharge navigator,  Select New Orders & click on the button choice - Manage This Unsigned Work.     Thank you for allowing pharmacy to be a part of this patient's care.  Doreene Eland, PharmD, BCPS, BCIDP Work Cell: [phone] 07/06/2021 2:01 PM

## 2021-07-07 ENCOUNTER — Inpatient Hospital Stay: Payer: Medicare Other

## 2021-07-07 ENCOUNTER — Encounter: Payer: Self-pay | Admitting: Radiology

## 2021-07-07 DIAGNOSIS — B955 Unspecified streptococcus as the cause of diseases classified elsewhere: Secondary | ICD-10-CM | POA: Diagnosis not present

## 2021-07-07 DIAGNOSIS — A419 Sepsis, unspecified organism: Secondary | ICD-10-CM | POA: Diagnosis not present

## 2021-07-07 DIAGNOSIS — G061 Intraspinal abscess and granuloma: Secondary | ICD-10-CM

## 2021-07-07 DIAGNOSIS — K222 Esophageal obstruction: Secondary | ICD-10-CM | POA: Diagnosis not present

## 2021-07-07 DIAGNOSIS — R7881 Bacteremia: Secondary | ICD-10-CM | POA: Diagnosis not present

## 2021-07-07 DIAGNOSIS — N390 Urinary tract infection, site not specified: Secondary | ICD-10-CM | POA: Diagnosis not present

## 2021-07-07 DIAGNOSIS — M546 Pain in thoracic spine: Secondary | ICD-10-CM | POA: Diagnosis not present

## 2021-07-07 LAB — CULTURE, BLOOD (ROUTINE X 2)
Culture: NO GROWTH
Culture: NO GROWTH
Special Requests: ADEQUATE

## 2021-07-07 LAB — CBC
HCT: 34 % — ABNORMAL LOW (ref 39.0–52.0)
Hemoglobin: 11.5 g/dL — ABNORMAL LOW (ref 13.0–17.0)
MCH: 27.8 pg (ref 26.0–34.0)
MCHC: 33.8 g/dL (ref 30.0–36.0)
MCV: 82.1 fL (ref 80.0–100.0)
Platelets: 292 10*3/uL (ref 150–400)
RBC: 4.14 MIL/uL — ABNORMAL LOW (ref 4.22–5.81)
RDW: 13.1 % (ref 11.5–15.5)
WBC: 10.7 10*3/uL — ABNORMAL HIGH (ref 4.0–10.5)
nRBC: 0 % (ref 0.0–0.2)

## 2021-07-07 LAB — BASIC METABOLIC PANEL
Anion gap: 7 (ref 5–15)
BUN: 13 mg/dL (ref 8–23)
CO2: 30 mmol/L (ref 22–32)
Calcium: 8.7 mg/dL — ABNORMAL LOW (ref 8.9–10.3)
Chloride: 94 mmol/L — ABNORMAL LOW (ref 98–111)
Creatinine, Ser: 0.51 mg/dL — ABNORMAL LOW (ref 0.61–1.24)
GFR, Estimated: >60 mL/min (ref >60)
Glucose, Bld: 117 mg/dL — ABNORMAL HIGH (ref 70–99)
Potassium: 3.3 mmol/L — ABNORMAL LOW (ref 3.5–5.1)
Sodium: 131 mmol/L — ABNORMAL LOW (ref 135–145)

## 2021-07-07 LAB — GLUCOSE, CAPILLARY: Glucose-Capillary: 108 mg/dL — ABNORMAL HIGH (ref 70–99)

## 2021-07-07 LAB — MAGNESIUM: Magnesium: 2 mg/dL (ref 1.7–2.4)

## 2021-07-07 MED ORDER — AMLODIPINE BESYLATE 5 MG PO TABS
5.0000 mg | ORAL_TABLET | Freq: Every day | ORAL | Status: DC
  Administered 2021-07-07 – 2021-07-10 (×4): 5 mg via ORAL
  Filled 2021-07-07 (×4): qty 1

## 2021-07-07 MED ORDER — LABETALOL HCL 5 MG/ML IV SOLN: 20.0000 mg | INTRAVENOUS | Status: DC | PRN

## 2021-07-07 MED ORDER — LOSARTAN POTASSIUM 50 MG PO TABS
100.0000 mg | ORAL_TABLET | Freq: Every day | ORAL | Status: DC
  Administered 2021-07-08 – 2021-07-12 (×5): 100 mg via ORAL
  Filled 2021-07-07 (×5): qty 2

## 2021-07-07 MED ORDER — HYDROMORPHONE HCL 1 MG/ML IJ SOLN
1.0000 mg | Freq: Once | INTRAMUSCULAR | Status: AC
  Administered 2021-07-07: 1 mg via INTRAVENOUS
  Filled 2021-07-07: qty 1

## 2021-07-07 MED ORDER — LOSARTAN POTASSIUM 50 MG PO TABS
50.0000 mg | ORAL_TABLET | Freq: Once | ORAL | Status: AC
  Administered 2021-07-07: 50 mg via ORAL
  Filled 2021-07-07: qty 1

## 2021-07-07 MED ORDER — HYDROMORPHONE HCL 2 MG PO TABS
2.0000 mg | ORAL_TABLET | Freq: Four times a day (QID) | ORAL | Status: DC
  Administered 2021-07-07 – 2021-07-08 (×4): 2 mg via ORAL
  Filled 2021-07-07 (×5): qty 1

## 2021-07-07 MED ORDER — CELECOXIB 200 MG PO CAPS
200.0000 mg | ORAL_CAPSULE | Freq: Two times a day (BID) | ORAL | Status: DC
  Administered 2021-07-07 – 2021-07-12 (×11): 200 mg via ORAL
  Filled 2021-07-07 (×11): qty 1

## 2021-07-07 MED ORDER — MORPHINE SULFATE ER 30 MG PO TBCR
30.0000 mg | EXTENDED_RELEASE_TABLET | Freq: Two times a day (BID) | ORAL | Status: DC
  Administered 2021-07-08 – 2021-07-12 (×10): 30 mg via ORAL
  Filled 2021-07-07 (×10): qty 1

## 2021-07-07 MED ORDER — POTASSIUM CHLORIDE CRYS ER 20 MEQ PO TBCR
40.0000 meq | EXTENDED_RELEASE_TABLET | Freq: Once | ORAL | Status: AC
  Administered 2021-07-07: 40 meq via ORAL
  Filled 2021-07-07: qty 2

## 2021-07-07 MED ORDER — HYDROMORPHONE HCL 1 MG/ML IJ SOLN
2.0000 mg | INTRAMUSCULAR | Status: DC | PRN
  Administered 2021-07-07 – 2021-07-08 (×4): 2 mg via INTRAVENOUS
  Filled 2021-07-07 (×5): qty 2

## 2021-07-07 MED ORDER — GADOBUTROL 1 MMOL/ML IV SOLN
9.0000 mL | Freq: Once | INTRAVENOUS | Status: AC | PRN
Start: 2021-07-07 — End: 2021-07-07
  Administered 2021-07-07: 9 mL via INTRAVENOUS

## 2021-07-07 MED ORDER — HYDROMORPHONE HCL 2 MG PO TABS: 4.0000 mg | ORAL_TABLET | Freq: Four times a day (QID) | ORAL | Status: DC

## 2021-07-07 MED ORDER — MORPHINE SULFATE ER 30 MG PO TBCR
60.0000 mg | EXTENDED_RELEASE_TABLET | Freq: Every day | ORAL | Status: DC
  Administered 2021-07-07 – 2021-07-11 (×5): 60 mg via ORAL
  Filled 2021-07-07 (×5): qty 2

## 2021-07-07 MED ORDER — NALOXONE HCL 0.4 MG/ML IJ SOLN
0.4000 mg | INTRAMUSCULAR | Status: DC | PRN
Start: 2021-07-07 — End: 2021-07-12

## 2021-07-08 DIAGNOSIS — A419 Sepsis, unspecified organism: Secondary | ICD-10-CM | POA: Diagnosis not present

## 2021-07-08 DIAGNOSIS — N390 Urinary tract infection, site not specified: Secondary | ICD-10-CM | POA: Diagnosis not present

## 2021-07-08 LAB — CBC
HCT: 31.2 % — ABNORMAL LOW (ref 39.0–52.0)
Hemoglobin: 10.4 g/dL — ABNORMAL LOW (ref 13.0–17.0)
MCH: 27.2 pg (ref 26.0–34.0)
MCHC: 33.3 g/dL (ref 30.0–36.0)
MCV: 81.5 fL (ref 80.0–100.0)
Platelets: 274 10*3/uL (ref 150–400)
RBC: 3.83 MIL/uL — ABNORMAL LOW (ref 4.22–5.81)
RDW: 13.1 % (ref 11.5–15.5)
WBC: 8.4 10*3/uL (ref 4.0–10.5)
nRBC: 0 % (ref 0.0–0.2)

## 2021-07-08 LAB — GLUCOSE, CAPILLARY: Glucose-Capillary: 142 mg/dL — ABNORMAL HIGH (ref 70–99)

## 2021-07-08 LAB — CULTURE, BLOOD (ROUTINE X 2): Special Requests: ADEQUATE

## 2021-07-08 LAB — BASIC METABOLIC PANEL
Anion gap: 6 (ref 5–15)
BUN: 12 mg/dL (ref 8–23)
CO2: 29 mmol/L (ref 22–32)
Calcium: 8.6 mg/dL — ABNORMAL LOW (ref 8.9–10.3)
Chloride: 94 mmol/L — ABNORMAL LOW (ref 98–111)
Creatinine, Ser: 0.61 mg/dL (ref 0.61–1.24)
GFR, Estimated: >60 mL/min (ref >60)
Glucose, Bld: 126 mg/dL — ABNORMAL HIGH (ref 70–99)
Potassium: 3.6 mmol/L (ref 3.5–5.1)
Sodium: 129 mmol/L — ABNORMAL LOW (ref 135–145)

## 2021-07-08 MED ORDER — HYDROMORPHONE HCL 2 MG PO TABS
2.0000 mg | ORAL_TABLET | Freq: Four times a day (QID) | ORAL | Status: DC | PRN
  Administered 2021-07-11 – 2021-07-12 (×4): 2 mg via ORAL
  Filled 2021-07-08 (×4): qty 1

## 2021-07-08 MED ORDER — METRONIDAZOLE 500 MG/100ML IV SOLN
500.0000 mg | Freq: Two times a day (BID) | INTRAVENOUS | Status: DC
  Administered 2021-07-08 – 2021-07-10 (×4): 500 mg via INTRAVENOUS
  Filled 2021-07-08 (×4): qty 100

## 2021-07-08 MED ORDER — ACETAMINOPHEN 500 MG PO TABS
1000.0000 mg | ORAL_TABLET | Freq: Four times a day (QID) | ORAL | Status: DC | PRN
  Administered 2021-07-08 – 2021-07-09 (×2): 1000 mg via ORAL
  Filled 2021-07-08 (×2): qty 2

## 2021-07-08 NOTE — Progress Notes (Signed)
   07/08/21 1713  Assess: MEWS Score  Temp (!) 101.7 F (38.7 C)  BP 127/66  MAP (mmHg) 83  Pulse Rate (!) 101  Resp 15  SpO2 93 %  O2 Device Room Air  Assess: MEWS Score  MEWS Temp 2  MEWS Systolic 0  MEWS Pulse 1  MEWS RR 0  MEWS LOC 0  MEWS Score 3  MEWS Score Color Yellow  Assess: if the MEWS score is Yellow or Red  Were vital signs taken at a resting state? Yes  Focused Assessment Change from prior assessment (see assessment flowsheet)  Does the patient meet 2 or more of the SIRS criteria? No  Does the patient have a confirmed or suspected source of infection? No  MEWS guidelines implemented *See Row Information* Yes  Treat  MEWS Interventions Escalated (See documentation below)  Pain Scale 0-10  Pain Score 0  Take Vital Signs  Increase Vital Sign Frequency  Yellow: Q 2hr X 2 then Q 4hr X 2, if remains yellow, continue Q 4hrs  Escalate  MEWS: Escalate Yellow: discuss with charge nurse/RN and consider discussing with provider and RRT  Notify: Charge Nurse/RN  Name of Charge Nurse/RN Notified Doreatha Massed  Date Charge Nurse/RN Notified 07/08/21  Time Charge Nurse/RN Notified 1716  Notify: Provider  Provider Name/Title Dr Ashok Pall  Date Provider Notified 07/08/21  Time Provider Notified 1718  Method of Notification  (text)  Notification Reason Change in status  Provider response See new orders  Date of Provider Response 07/08/21  Time of Provider Response 1719  Document  Patient Outcome Other (Comment) (gave prn med for fever)  Progress note created (see row info) Yes  Assess: SIRS CRITERIA  SIRS Temperature  1  SIRS Pulse 1  SIRS Respirations  0  SIRS WBC 0  SIRS Score Sum  2

## 2021-07-09 DIAGNOSIS — N390 Urinary tract infection, site not specified: Secondary | ICD-10-CM | POA: Diagnosis not present

## 2021-07-09 DIAGNOSIS — A419 Sepsis, unspecified organism: Secondary | ICD-10-CM | POA: Diagnosis not present

## 2021-07-09 LAB — BASIC METABOLIC PANEL
Anion gap: 7 (ref 5–15)
BUN: 18 mg/dL (ref 8–23)
CO2: 27 mmol/L (ref 22–32)
Calcium: 8.7 mg/dL — ABNORMAL LOW (ref 8.9–10.3)
Chloride: 93 mmol/L — ABNORMAL LOW (ref 98–111)
Creatinine, Ser: 1.14 mg/dL (ref 0.61–1.24)
GFR, Estimated: >60 mL/min (ref >60)
Glucose, Bld: 114 mg/dL — ABNORMAL HIGH (ref 70–99)
Potassium: 3.8 mmol/L (ref 3.5–5.1)
Sodium: 127 mmol/L — ABNORMAL LOW (ref 135–145)

## 2021-07-09 LAB — OSMOLALITY, URINE: Osmolality, Ur: 375 mOsm/kg (ref 300–900)

## 2021-07-09 LAB — CBC
HCT: 31.2 % — ABNORMAL LOW (ref 39.0–52.0)
Hemoglobin: 10.3 g/dL — ABNORMAL LOW (ref 13.0–17.0)
MCH: 27.4 pg (ref 26.0–34.0)
MCHC: 33 g/dL (ref 30.0–36.0)
MCV: 83 fL (ref 80.0–100.0)
Platelets: 282 10*3/uL (ref 150–400)
RBC: 3.76 MIL/uL — ABNORMAL LOW (ref 4.22–5.81)
RDW: 13.4 % (ref 11.5–15.5)
WBC: 16.2 10*3/uL — ABNORMAL HIGH (ref 4.0–10.5)
nRBC: 0 % (ref 0.0–0.2)

## 2021-07-09 LAB — TSH: TSH: 2.706 u[IU]/mL (ref 0.350–4.500)

## 2021-07-09 LAB — AEROBIC/ANAEROBIC CULTURE W GRAM STAIN (SURGICAL/DEEP WOUND): Gram Stain: NONE SEEN

## 2021-07-09 LAB — CULTURE, BLOOD (ROUTINE X 2)
Culture: NO GROWTH
Special Requests: ADEQUATE

## 2021-07-09 LAB — SEDIMENTATION RATE: Sed Rate: 65 mm/hr — ABNORMAL HIGH (ref 0–20)

## 2021-07-09 LAB — SODIUM, URINE, RANDOM: Sodium, Ur: 96 mmol/L

## 2021-07-09 LAB — C-REACTIVE PROTEIN: CRP: 16.2 mg/dL — ABNORMAL HIGH (ref <1.0)

## 2021-07-09 NOTE — Progress Notes (Signed)
Progress Note   Patient: Andrew Mullins QMV:[id number] DOB: 07-14-1955 DOA: 06/28/2021     10 DOS: the patient was seen and examined on 07/09/2021   Brief hospital course: Andrew Mullins is a 66 y.o. male with a known history of CVA with residual weakness, prostate CA, HTN, GERD< esophageal cancer  presents to the emergency department for evaluation of shaking chills and fever.  He was seen in our ER two days ago for symptoms of a pinched nerve in his back that started after doing work in the yard.  He was given muscle relaxant, lidocaine patch and discharged home.  He returns today with complaint of confusion and weakness. Has  been taking morphine 18mg  PO daily for help with back pain.    Patient denies  weakness, dizziness, chest pain, shortness of breath, N/V/C/D, abdominal pain, dysuria/frequency, changes in mental status.    Assessment and Plan: Sepsis with Streptococcus bacteremia Patient met sepsis criteria with fever, tachycardia and tachypnea.  UA concerning for UTI.  Initial blood cultures with Streptococcus anginosus, history of recent esophageal dilatation, had total of 82 so far.   Urine cultures with E. Coli and good sensitivity. No fevers last 24. Unfortunately 1/4 BCs from 6/20 growing prevotella denticola. PICC was placed 6/22 -Continue ceftriaxone; metronidazole added today per ID -Continue supportive care -may need picc removal -Echocardiogram with normal EF and grade 1 diastolic dysfunction, no other significant abnormality noted.  We will try to avoid TEE due to history of esophageal cancer and multiple strictures requiring dilatations.  E. coli UTI Urine cultures growing E. Coli with good sensitivity.  Patient is already on ceftriaxone for Streptococcus bacteremia.  Only resistant to ampicillin and sulbactam. -Continue ceftriaxone  Discitis/osteomyelitis S/p hemilaminectomy involving T1-T2 for washout of epidural abscess on 07/02/2021.  Pain improved today.  Drain  was removed by neurosurgery. MRI of thoracic and lumbar spine with concerning of discitis/osteomyelitis with some abscess involving T1-T5, cervical spine MRI cervical spine with no osteomyelitis involving the cervical spine but extension of phlegmon/abscess up to C4-5.  Pain not adequately controlled so on 6/23 repeat MRI performed showing persistent/evolving osteo of t1-2 with worsened epidural abscess, severe stenosis and associated cord signal. Neuro surg dr. Elliot Dally has seen (note pending), says neuro exam is reassuring, declines further intervention for now, advises instead ongoing abx. Discussed with IR today, no role for IR intervention. Also reviewed case with covering ID today -Continue with ceftriaxone; flagyl added 6/24 as above - palliative following for pain control. I - PT/OT consulted, advising home health  Thrombocytopenia (HCC) Resolved  Hypokalemia -Replete potassium as needed -Continue to monitor  CVA (cerebral vascular accident) [facility]) Patient has an history of CVA.  No acute concern. -Continue home dose of Lipitor -plan to resume home aspirin at discharge  Essential hypertension Blood pressure mildly elevated, most likely pain is playing a role. -Continue home losartan and metoprolol. 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  History of multiple esophageal dilations in the past.  CTA chest with concern of esophagitis. Might have post dilatation bacteremia. Esophogram no signs leak -Continue with Protonix  Subjective: awake and alert, pain much improved  Physical Exam: Vitals:   07/08/21 2233 07/09/21 0157 07/09/21 0608 07/09/21 0838  BP: (!) 92/56  129/70 135/83 (!) 160/75  Pulse: 65 67 62 65  Resp: 16  16 16    Temp: 98.1 F (36.7 C) 97.6 F (36.4 C) 97.6 F (36.4 C) 98 F (36.7 C)  TempSrc:  Oral Oral   SpO2: 94% 97% 96% 97%  Weight:      Height:       General.  Well-developed gentleman, in no acute distress. Pulmonary.  Lungs clear bilaterally, normal respiratory effort. CV.  Regular rate and rhythm, no JVD, rub or murmur. Abdomen.  Soft, nontender, nondistended, BS positive. Skin back surgical incision c/d/i CNS.  Alert and oriented .  No focal neurologic deficit. Extremities.  No edema, no cyanosis, pulses intact and symmetrical. Psychiatry.  Judgment and insight appears normal.  Data Reviewed: Prior data reviewed  Family Communication: wife updated telephonically 6/24  Disposition: Status is: Inpatient Remains inpatient appropriate because: persistent infection   Planned Discharge Destination: Home with Home Health  DVT prophylaxis.  Lovenox Time spent: 35 minutes   Author: Silvano Bilis, MD 07/09/2021 12:40 PM  For on call review [email or web address].

## 2021-07-10 DIAGNOSIS — A419 Sepsis, unspecified organism: Secondary | ICD-10-CM | POA: Diagnosis not present

## 2021-07-10 DIAGNOSIS — R7881 Bacteremia: Secondary | ICD-10-CM | POA: Diagnosis not present

## 2021-07-10 DIAGNOSIS — B955 Unspecified streptococcus as the cause of diseases classified elsewhere: Secondary | ICD-10-CM | POA: Diagnosis not present

## 2021-07-10 DIAGNOSIS — N390 Urinary tract infection, site not specified: Secondary | ICD-10-CM | POA: Diagnosis not present

## 2021-07-10 DIAGNOSIS — M4644 Discitis, unspecified, thoracic region: Secondary | ICD-10-CM | POA: Diagnosis not present

## 2021-07-10 DIAGNOSIS — G061 Intraspinal abscess and granuloma: Secondary | ICD-10-CM | POA: Diagnosis not present

## 2021-07-10 LAB — BASIC METABOLIC PANEL
Anion gap: 5 (ref 5–15)
BUN: 18 mg/dL (ref 8–23)
CO2: 29 mmol/L (ref 22–32)
Calcium: 9 mg/dL (ref 8.9–10.3)
Chloride: 99 mmol/L (ref 98–111)
Creatinine, Ser: 0.74 mg/dL (ref 0.61–1.24)
GFR, Estimated: >60 mL/min (ref >60)
Glucose, Bld: 110 mg/dL — ABNORMAL HIGH (ref 70–99)
Potassium: 4.1 mmol/L (ref 3.5–5.1)
Sodium: 133 mmol/L — ABNORMAL LOW (ref 135–145)

## 2021-07-10 LAB — CBC
HCT: 33.4 % — ABNORMAL LOW (ref 39.0–52.0)
Hemoglobin: 10.9 g/dL — ABNORMAL LOW (ref 13.0–17.0)
MCH: 27.1 pg (ref 26.0–34.0)
MCHC: 32.6 g/dL (ref 30.0–36.0)
MCV: 83.1 fL (ref 80.0–100.0)
Platelets: 312 10*3/uL (ref 150–400)
RBC: 4.02 MIL/uL — ABNORMAL LOW (ref 4.22–5.81)
RDW: 13.1 % (ref 11.5–15.5)
WBC: 9.6 10*3/uL (ref 4.0–10.5)
nRBC: 0 % (ref 0.0–0.2)

## 2021-07-10 LAB — C-REACTIVE PROTEIN: CRP: 13.7 mg/dL — ABNORMAL HIGH (ref <1.0)

## 2021-07-10 LAB — CORTISOL-AM, BLOOD: Cortisol - AM: 14.4 ug/dL (ref 6.7–22.6)

## 2021-07-10 MED ORDER — FAMCICLOVIR 500 MG PO TABS
500.0000 mg | ORAL_TABLET | Freq: Every day | ORAL | Status: DC
  Administered 2021-07-10 – 2021-07-11 (×2): 500 mg via ORAL
  Filled 2021-07-10 (×2): qty 1

## 2021-07-10 MED ORDER — VALACYCLOVIR HCL 500 MG PO TABS
1000.0000 mg | ORAL_TABLET | Freq: Every day | ORAL | Status: DC
  Filled 2021-07-10: qty 2

## 2021-07-10 MED ORDER — AMLODIPINE BESYLATE 10 MG PO TABS
10.0000 mg | ORAL_TABLET | Freq: Every day | ORAL | Status: DC
  Administered 2021-07-11 – 2021-07-12 (×2): 10 mg via ORAL
  Filled 2021-07-10 (×2): qty 1

## 2021-07-10 MED ORDER — METRONIDAZOLE 500 MG/100ML IV SOLN
500.0000 mg | Freq: Three times a day (TID) | INTRAVENOUS | Status: DC
  Administered 2021-07-10 – 2021-07-11 (×4): 500 mg via INTRAVENOUS
  Filled 2021-07-10 (×4): qty 100

## 2021-07-10 NOTE — Progress Notes (Signed)
Occupational Therapy Treatment Patient Details Name: JAQUAIN APPLEGATE MRN: [id number] DOB: Apr 07, 1955 Today's Date: 07/10/2021   History of present illness Pt is a 66 y.o. male with PMH of CVA without residual weakness, prostate CA, HTN, GERD, esophageal cancer  presents to the emergency department for evaluation of shaking chills and fever. He was seen in ER 6/13 for symptoms of a pinched nerve in his back. He returned to ER 6/15 with complaint of confusion and weakness. Pt admitted for sepsis secondary to UTI. Pt underwent thoracic (T1-2) laminectomy for washout of epidural abscess on 6/18.   OT comments  Mr. Puglia presents with increased debility and pain, compared to OT session last week. During today's session, he reports 6/10 pain (down from 10/10 over previous 3 days) and appears tired and somewhat dejected. He declines OOB activity, stating that he participated in PT earlier today and does not want to move again. Engaged pt in bed level therex, repositioning, grooming. Provided educ re: back precautions, falls prevention. Encouraged pt to call for assistance and attempt OOB movement later in the day; he stated he would consider that, but would prefer to wait until tomorrow. Provided educ re: pain mgmt. Pt verbalized understanding. Will continue to follow PoC.   Recommendations for follow up therapy are one component of a multi-disciplinary discharge planning process, led by the attending physician.  Recommendations may be updated based on patient status, additional functional criteria and insurance authorization.    Follow Up Recommendations  Home health OT    Assistance Recommended at Discharge Intermittent Supervision/Assistance  Patient can return home with the following  A little help with walking and/or transfers;A little help with bathing/dressing/bathroom   Equipment Recommendations  None recommended by OT    Recommendations for Other Services      Precautions /  Restrictions Precautions Precautions: Back Precaution Comments: No BLT, no lifting >10 lbs. per PA through secure chat Restrictions Weight Bearing Restrictions: No       Mobility Bed Mobility Overal bed mobility: Needs Assistance Bed Mobility: Rolling, Sidelying to Sit Rolling: Supervision Sidelying to sit: Supervision            Transfers Overall transfer level: Needs assistance                 General transfer comment: pt declines, reporting fatigue and pain     Balance Overall balance assessment: Needs assistance Sitting-balance support: No upper extremity supported, Feet supported Sitting balance-Leahy Scale: Good Sitting balance - Comments: reports mild pain in sitting       Standing balance comment: declines                           ADL either performed or assessed with clinical judgement   ADL Overall ADL's : Needs assistance/impaired     Grooming: Wash/dry hands;Wash/dry face;Min guard;Bed level Grooming Details (indicate cue type and reason): pt reports needs to do at bed level today                     Toileting- Clothing Manipulation and Hygiene: Sitting/lateral lean;Min guard Toileting - Clothing Manipulation Details (indicate cue type and reason): using urinal at bed level, declines transfer to bathroom            Extremity/Trunk Assessment Upper Extremity Assessment Upper Extremity Assessment: Overall WFL for tasks assessed   Lower Extremity Assessment Lower Extremity Assessment: Overall WFL for tasks assessed  Vision       Perception     Praxis      Cognition Arousal/Alertness: Awake/alert Behavior During Therapy: WFL for tasks assessed/performed Overall Cognitive Status: Within Functional Limits for tasks assessed                                 General Comments: Pt A&Ox4        Exercises Other Exercises Other Exercises: Educ re: DC recs, back precautions, home modifications  and AE    Shoulder Instructions       General Comments      Pertinent Vitals/ Pain       Pain Assessment Pain Assessment: 0-10 Pain Score: 6  Pain Location: Upper back; interscapular Pain Descriptors / Indicators: Sharp, Grimacing Pain Intervention(s): Limited activity within patient's tolerance, Repositioned  Home Living                                          Prior Functioning/Environment              Frequency  Min 2X/week        Progress Toward Goals  OT Goals(current goals can now be found in the care plan section)  Progress towards OT goals: Progressing toward goals  Acute Rehab OT Goals OT Goal Formulation: With patient Time For Goal Achievement: 07/20/21 Potential to Achieve Goals: Good  Plan Discharge plan remains appropriate;Frequency remains appropriate    Co-evaluation                 AM-PAC OT "6 Clicks" Daily Activity     Outcome Measure   Help from another person eating meals?: None Help from another person taking care of personal grooming?: A Little Help from another person toileting, which includes using toliet, bedpan, or urinal?: A Little Help from another person bathing (including washing, rinsing, drying)?: A Little Help from another person to put on and taking off regular upper body clothing?: A Little Help from another person to put on and taking off regular lower body clothing?: A Little 6 Click Score: 19    End of Session    OT Visit Diagnosis: Unsteadiness on feet (R26.81);Muscle weakness (generalized) (M62.81);Pain   Activity Tolerance Patient limited by fatigue;Patient limited by pain   Patient Left in bed;with call bell/phone within reach;with family/visitor present   Nurse Communication          Time: 5956-3875 OT Time Calculation (min): 8 min  Charges: OT General Charges $OT Visit: 1 Visit OT Treatments $Self Care/Home Management : 8-22 mins Latina Craver, PhD, MS,  OTR/L 07/10/21, 11:45 AM

## 2021-07-10 NOTE — Progress Notes (Signed)
Progress Note   Patient: Andrew Mullins ZOX:[id number] DOB: [birth year] DOA: 06/28/2021     11 DOS: the patient was seen and examined on 07/10/2021   Brief hospital course: Andrew Mullins is a 66 y.o. male with a known history of CVA with residual weakness, prostate CA, HTN, GERD< esophageal cancer  presents to the emergency department for evaluation of shaking chills and fever.  He was seen in our ER two days ago for symptoms of a pinched nerve in his back that started after doing work in the yard.  He was given muscle relaxant, lidocaine patch and discharged home.  He returns today with complaint of confusion and weakness. Has  been taking morphine 18mg  PO daily for help with back pain.    Patient denies  weakness, dizziness, chest pain, shortness of breath, N/V/C/D, abdominal pain, dysuria/frequency, changes in mental status.    Assessment and Plan: Sepsis with Streptococcus bacteremia Patient met sepsis criteria with fever, tachycardia and tachypnea.  UA concerning for UTI.  Initial blood cultures with Streptococcus anginosus, history of recent esophageal dilatation, had total of 82 so far.   Urine cultures with E. Coli and good sensitivity. No fevers last 24. Unfortunately 1/4 BCs from 6/20 growing prevotella denticola. PICC was placed 6/22, removed today per ID. ID wants fever free (last fever 18:30 on 6/24) 72 hours prior to replacing picc -Continue ceftriaxone; metronidazole added  -Continue supportive care -Echocardiogram with normal EF and grade 1 diastolic dysfunction, no other significant abnormality noted.  We will try to avoid TEE due to history of esophageal cancer and multiple strictures requiring dilatations.  E. coli UTI Urine cultures growing E. Coli with good sensitivity.  Patient is already on ceftriaxone for Streptococcus bacteremia.  Only resistant to ampicillin and sulbactam. -Continue ceftriaxone  Discitis/osteomyelitis S/p hemilaminectomy involving T1-T2 for  washout of epidural abscess on 07/02/2021.  Pain improved today.  Drain was removed by neurosurgery. MRI of thoracic and lumbar spine with concerning of discitis/osteomyelitis with some abscess involving T1-T5, cervical spine MRI cervical spine with no osteomyelitis involving the cervical spine but extension of phlegmon/abscess up to C4-5.  Pain not adequately controlled so on 6/23 repeat MRI performed showing persistent/evolving osteo of t1-2 with worsened epidural abscess, severe stenosis and associated cord signal. Neuro surg dr. Elliot Dally has seen (note pending), says neuro exam is reassuring, declines further intervention for now, advises instead ongoing abx. Discussed with IR today, no role for IR intervention.  -Continue with ceftriaxone; flagyl added 6/24 as above - palliative following for pain control. Much better control last 36 hours - PT/OT consulted, advising home health  Thrombocytopenia (HCC) Resolved  Hypokalemia -Replete potassium as needed -Continue to monitor  CVA (cerebral vascular accident) [facility]) Patient has an history of CVA.  No acute concern. -Continue home dose of Lipitor -plan to resume home aspirin at discharge  Essential hypertension Blood pressure mildly elevated, most likely pain is playing a role. -Continue home losartan and metoprolol. Increase losartan to 100 qd and added amlod 5 will increase to 10 - labetalol prn -Continue to monitor  HSV genitalis Hx of. 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 Improving, today 133. Tsh wnl - f/u am cortisol  Esophageal stricture Patient has a recent esophageal dilatation at [facility].  History of multiple esophageal dilations in the past.  CTA chest with concern of esophagitis. Might have post dilatation bacteremia. Esophogram  no signs leak. Case discussed w/ Dr. Tobi Bastos, advises no w/u  inpatient -Continue with Protonix  Subjective: Pain much improved, no weakness/numbness  Physical Exam: Vitals:   07/09/21 2014 07/10/21 0026 07/10/21 0612 07/10/21 0831  BP: (!) 145/74 (!) 151/69 (!) 141/72 (!) 152/76  Pulse: 79 67 66 60  Resp: 18 18 18 16   Temp: 97.7 F (36.5 C) 97.7 F (36.5 C) 97.6 F (36.4 C) 97.6 F (36.4 C)  TempSrc:    Oral  SpO2: 99% 99% 99% 98%  Weight:      Height:       General.  Well-developed gentleman, in no acute distress. Pulmonary.  Lungs clear bilaterally, normal respiratory effort. CV.  Regular rate and rhythm, no JVD, rub or murmur. Abdomen.  Soft, nontender, nondistended, BS positive. Skin back surgical incision c/d/i CNS.  Alert and oriented .  No focal neurologic deficit. Extremities.  No edema, no cyanosis, pulses intact and symmetrical. Psychiatry.  Judgment and insight appears normal.  Data Reviewed: Prior data reviewed  Family Communication: wife updated @ bedside 6/26  Disposition: Status is: Inpatient Remains inpatient appropriate because: persistent infection   Planned Discharge Destination: Home with Home Health  DVT prophylaxis.  Lovenox Time spent: 35 minutes   Author: Silvano Bilis, MD 07/10/2021 12:44 PM  For on call review [email or web address].

## 2021-07-11 DIAGNOSIS — A419 Sepsis, unspecified organism: Secondary | ICD-10-CM | POA: Diagnosis not present

## 2021-07-11 DIAGNOSIS — M546 Pain in thoracic spine: Secondary | ICD-10-CM | POA: Diagnosis not present

## 2021-07-11 DIAGNOSIS — K222 Esophageal obstruction: Secondary | ICD-10-CM | POA: Diagnosis not present

## 2021-07-11 DIAGNOSIS — B9689 Other specified bacterial agents as the cause of diseases classified elsewhere: Secondary | ICD-10-CM

## 2021-07-11 DIAGNOSIS — B955 Unspecified streptococcus as the cause of diseases classified elsewhere: Secondary | ICD-10-CM | POA: Diagnosis not present

## 2021-07-11 DIAGNOSIS — M4644 Discitis, unspecified, thoracic region: Secondary | ICD-10-CM | POA: Diagnosis not present

## 2021-07-11 DIAGNOSIS — G061 Intraspinal abscess and granuloma: Secondary | ICD-10-CM | POA: Diagnosis not present

## 2021-07-11 DIAGNOSIS — N390 Urinary tract infection, site not specified: Secondary | ICD-10-CM | POA: Diagnosis not present

## 2021-07-11 DIAGNOSIS — R7881 Bacteremia: Secondary | ICD-10-CM | POA: Diagnosis not present

## 2021-07-11 LAB — C-REACTIVE PROTEIN: CRP: 12.5 mg/dL — ABNORMAL HIGH (ref <1.0)

## 2021-07-11 MED ORDER — HYDROMORPHONE HCL 1 MG/ML IJ SOLN
0.5000 mg | INTRAMUSCULAR | Status: DC | PRN
  Administered 2021-07-11: 0.5 mg via INTRAVENOUS
  Filled 2021-07-11: qty 1

## 2021-07-11 MED ORDER — POLYETHYLENE GLYCOL 3350 17 G PO PACK
34.0000 g | PACK | Freq: Every day | ORAL | Status: DC
  Administered 2021-07-12: 34 g via ORAL
  Filled 2021-07-11: qty 2

## 2021-07-11 MED ORDER — METRONIDAZOLE 500 MG PO TABS
500.0000 mg | ORAL_TABLET | Freq: Four times a day (QID) | ORAL | Status: DC
  Administered 2021-07-11 – 2021-07-12 (×3): 500 mg via ORAL
  Filled 2021-07-11 (×3): qty 1

## 2021-07-11 MED ORDER — SENNOSIDES-DOCUSATE SODIUM 8.6-50 MG PO TABS
1.0000 | ORAL_TABLET | Freq: Every day | ORAL | Status: DC
  Administered 2021-07-11 – 2021-07-12 (×2): 1 via ORAL
  Filled 2021-07-11 (×2): qty 1

## 2021-07-11 MED ORDER — ASPIRIN 81 MG PO TBEC
81.0000 mg | DELAYED_RELEASE_TABLET | Freq: Every day | ORAL | Status: DC
  Administered 2021-07-12: 81 mg via ORAL
  Filled 2021-07-11: qty 1

## 2021-07-11 NOTE — Progress Notes (Signed)
Occupational Therapy Treatment Patient Details Name: KULDEEP COTES MRN: [id number] DOB: [birth year] Today's Date: 07/11/2021   History of present illness Pt is a 66 y.o. male with PMH of CVA without residual weakness, prostate CA, HTN, GERD, esophageal cancer  presents to the emergency department for evaluation of shaking chills and fever. He was seen in ER 6/13 for symptoms of a pinched nerve in his back. He returned to ER 6/15 with complaint of confusion and weakness. Pt admitted for sepsis secondary to UTI. Pt underwent thoracic (T1-2) laminectomy for washout of epidural abscess on 6/18.   OT comments   Mr. Dun performed well today, with reduced pain and improved balance and stamina compared to earlier OT sessions. He was able to ambulate in room without RW, although using walls and furniture PRN for stability. He engaged in grooming in standing, toileting, UB dressing, with good safety awareness and attention to back precautions. Will continue to follow acutely, with DC to HHOT recommended.    Recommendations for follow up therapy are one component of a multi-disciplinary discharge planning process, led by the attending physician.  Recommendations may be updated based on patient status, additional functional criteria and insurance authorization.    Follow Up Recommendations  Home health OT    Assistance Recommended at Discharge Intermittent Supervision/Assistance  Patient can return home with the following  A little help with walking and/or transfers;A little help with bathing/dressing/bathroom   Equipment Recommendations  None recommended by OT    Recommendations for Other Services      Precautions / Restrictions Precautions Precautions: Back Precaution Comments: No BLT, no lifting >10 lbs. per PA through secure chat Restrictions Weight Bearing Restrictions: No       Mobility Bed Mobility Overal bed mobility: Needs Assistance Bed Mobility: Supine to Sit      Supine to sit: Supervision     General bed mobility comments: Pt performed log roll to sidelying prior to sitting EOB; no physical assist needed.    Transfers Overall transfer level: Needs assistance Equipment used: Rolling walker (2 wheels) Transfers: Bed to chair/wheelchair/BSC       Step pivot transfers: Supervision     General transfer comment: able to perform w/o UE support     Balance Overall balance assessment: Needs assistance Sitting-balance support: No upper extremity supported, Feet supported Sitting balance-Leahy Scale: Good     Standing balance support: No upper extremity supported, Single extremity supported, During functional activity, Bilateral upper extremity supported Standing balance-Leahy Scale: Good Standing balance comment: good balance during fxl mobility, good safety awareness                           ADL either performed or assessed with clinical judgement   ADL Overall ADL's : Needs assistance/impaired     Grooming: Wash/dry hands;Wash/dry face;Oral care;Brushing hair;Standing;Supervision/safety Grooming Details (indicate cue type and reason): able to perform standing at sink, using cabinet PRN for stability, able to reach beyond BOS                 Toilet Transfer: Regular Toilet;Ambulation;Supervision/safety Toilet Transfer Details (indicate cue type and reason): without RW, using sink and grab bar for support Toileting- Clothing Manipulation and Hygiene: Sitting/lateral lean;Modified independent              Extremity/Trunk Assessment Upper Extremity Assessment Upper Extremity Assessment: Overall WFL for tasks assessed   Lower Extremity Assessment Lower Extremity Assessment: Overall WFL for tasks assessed  Cervical / Trunk Assessment Cervical / Trunk Assessment: Back Surgery    Vision       Perception     Praxis      Cognition Arousal/Alertness: Awake/alert Behavior During Therapy: WFL for tasks  assessed/performed Overall Cognitive Status: Within Functional Limits for tasks assessed                                 General Comments: Pt A&Ox4        Exercises Other Exercises Other Exercises: bed mobility, transfers, grooming, toileting, dressing; educ re: back precautions, falls prevention    Shoulder Instructions       General Comments      Pertinent Vitals/ Pain       Pain Assessment Pain Score: 6  Pain Location: Upper back; interscapular Pain Descriptors / Indicators: Discomfort, Aching Pain Intervention(s): Repositioned, Limited activity within patient's tolerance  Home Living                                          Prior Functioning/Environment              Frequency  Min 2X/week        Progress Toward Goals  OT Goals(current goals can now be found in the care plan section)  Progress towards OT goals: Progressing toward goals  Acute Rehab OT Goals OT Goal Formulation: With patient Time For Goal Achievement: 07/20/21 Potential to Achieve Goals: Good  Plan Discharge plan remains appropriate;Frequency remains appropriate    Co-evaluation                 AM-PAC OT "6 Clicks" Daily Activity     Outcome Measure   Help from another person eating meals?: None Help from another person taking care of personal grooming?: A Little Help from another person toileting, which includes using toliet, bedpan, or urinal?: A Little Help from another person bathing (including washing, rinsing, drying)?: A Little Help from another person to put on and taking off regular upper body clothing?: A Little Help from another person to put on and taking off regular lower body clothing?: A Little 6 Click Score: 19    End of Session Equipment Utilized During Treatment: Rolling walker (2 wheels)  OT Visit Diagnosis: Unsteadiness on feet (R26.81);Muscle weakness (generalized) (M62.81);Pain   Activity Tolerance Patient tolerated  treatment well   Patient Left in chair;with call bell/phone within reach   Nurse Communication Mobility status        Time: 1040-1107 OT Time Calculation (min): 27 min  Charges: OT General Charges $OT Visit: 1 Visit OT Treatments $Self Care/Home Management : 23-37 mins Latina Craver, PhD, MS, OTR/L 07/11/21, 12:07 PM

## 2021-07-12 ENCOUNTER — Inpatient Hospital Stay: Payer: Self-pay

## 2021-07-12 DIAGNOSIS — A419 Sepsis, unspecified organism: Secondary | ICD-10-CM | POA: Diagnosis not present

## 2021-07-12 DIAGNOSIS — M546 Pain in thoracic spine: Secondary | ICD-10-CM | POA: Diagnosis not present

## 2021-07-12 DIAGNOSIS — K222 Esophageal obstruction: Secondary | ICD-10-CM | POA: Diagnosis not present

## 2021-07-12 DIAGNOSIS — N390 Urinary tract infection, site not specified: Secondary | ICD-10-CM | POA: Diagnosis not present

## 2021-07-12 LAB — CULTURE, BLOOD (ROUTINE X 2)
Culture: NO GROWTH
Culture: NO GROWTH
Special Requests: ADEQUATE
Special Requests: ADEQUATE

## 2021-07-12 LAB — BASIC METABOLIC PANEL
Anion gap: 8 (ref 5–15)
BUN: 14 mg/dL (ref 8–23)
CO2: 28 mmol/L (ref 22–32)
Calcium: 8.9 mg/dL (ref 8.9–10.3)
Chloride: 98 mmol/L (ref 98–111)
Creatinine, Ser: 0.72 mg/dL (ref 0.61–1.24)
GFR, Estimated: >60 mL/min (ref >60)
Glucose, Bld: 145 mg/dL — ABNORMAL HIGH (ref 70–99)
Potassium: 3.7 mmol/L (ref 3.5–5.1)
Sodium: 134 mmol/L — ABNORMAL LOW (ref 135–145)

## 2021-07-12 MED ORDER — CYCLOBENZAPRINE HCL 10 MG PO TABS
10.0000 mg | ORAL_TABLET | Freq: Three times a day (TID) | ORAL | 0 refills | Status: DC | PRN
Start: 2021-07-12 — End: 2021-08-24

## 2021-07-12 MED ORDER — CEFTRIAXONE IV (FOR PTA / DISCHARGE USE ONLY): 2.0000 g | Freq: Two times a day (BID) | INTRAVENOUS | 0 refills | Status: AC

## 2021-07-12 MED ORDER — CELECOXIB 200 MG PO CAPS
200.0000 mg | ORAL_CAPSULE | Freq: Two times a day (BID) | ORAL | 0 refills | Status: AC
Start: 2021-07-12 — End: 2021-07-26

## 2021-07-12 MED ORDER — LOSARTAN POTASSIUM 100 MG PO TABS: 100.0000 mg | ORAL_TABLET | Freq: Every day | ORAL | 0 refills | Status: DC

## 2021-07-12 MED ORDER — MORPHINE SULFATE ER 30 MG PO TBCR: 30.0000 mg | EXTENDED_RELEASE_TABLET | Freq: Two times a day (BID) | ORAL | 0 refills | Status: AC

## 2021-07-12 MED ORDER — POLYETHYLENE GLYCOL 3350 17 G PO PACK: 17.0000 g | PACK | Freq: Every day | ORAL | 0 refills | Status: DC | PRN

## 2021-07-12 MED ORDER — SODIUM CHLORIDE 0.9% FLUSH: 10.0000 mL | INTRAVENOUS | Status: DC | PRN

## 2021-07-12 MED ORDER — HYDROMORPHONE HCL 2 MG PO TABS: 2.0000 mg | ORAL_TABLET | Freq: Four times a day (QID) | ORAL | 0 refills | Status: AC | PRN

## 2021-07-12 MED ORDER — SODIUM CHLORIDE 0.9% FLUSH: 10.0000 mL | Freq: Two times a day (BID) | INTRAVENOUS | Status: DC

## 2021-07-12 MED ORDER — SENNOSIDES-DOCUSATE SODIUM 8.6-50 MG PO TABS: 1.0000 | ORAL_TABLET | Freq: Every day | ORAL | 0 refills | Status: AC

## 2021-07-12 MED ORDER — CEFTRIAXONE IV (FOR PTA / DISCHARGE USE ONLY): 2.0000 g | Freq: Two times a day (BID) | INTRAVENOUS | 0 refills | Status: DC

## 2021-07-12 MED ORDER — METRONIDAZOLE 500 MG PO TABS
500.0000 mg | ORAL_TABLET | Freq: Four times a day (QID) | ORAL | 0 refills | Status: AC
Start: 2021-07-12 — End: 2021-08-12

## 2021-07-12 MED ORDER — MORPHINE SULFATE ER 60 MG PO TBCR: 60.0000 mg | EXTENDED_RELEASE_TABLET | Freq: Every day | ORAL | 0 refills | Status: AC

## 2021-07-12 NOTE — Progress Notes (Signed)
Palliative Care Progress Note, Assessment & Plan   Patient Name: Andrew Mullins       Date: 07/12/2021 DOB: 02-08-1955  Age: 66 y.o. MRN#: [id number] Attending Physician: Sidney Ace, MD Primary Care Physician: Baxter Hire, MD Admit Date: 06/28/2021  Reason for Consultation/Follow-up: Establishing goals of care  Subjective: Patient is sitting in bed in no apparent distress.  His wife is at bedside.  Patient has no acute complaints.  He endorses attempting a BM but has not had a BM since 6/22.    HPI: 66 y.o. 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    Patient was seen in ER 2 days ago for symptoms of a pinched nerve in his back status post yardwork.  Patient was given a muscle relaxant, lidocaine patch, and discharged home.  Patient returned and was admitted on on 06/28/2021 with confusion, weakness, shaking and chills.     On arrival to ED, patient was treated with sepsis protocol.  UA was positive for nitrites and leukocytes.     Approximately 2 weeks ago, patient underwent esophageal dilatation at [facility].  CT of chest with some concern of esophagitis.  Patient may have bacteremia secondary to esophageal dilatation.   During this 5-day hospitalization patient has had febrile episodes with elevated procalcitonin's.  Urine cultures grew E. coli resistant to ampicillin and sulbactam.  Blood cultures revealed Streptococcus anginosus with good susceptibility.  Echocardiogram on 618 revealed grade 1 diastolic dysfunction with normal EF and no mention of any valvular disease.   6/18,  pt underwent T1-2 hemilaminectomy for washout of epidural abscess.  Drain was placed and then removed under advisement of neurosurgery.    GI, ID and PMT were consulted. PMT was asked to be part of care team to help manage patient's pain.  Summary of counseling/coordination of care: After reviewing the patient's chart and assessing the patient at bedside, spoke with patient in regards to discharge planning.  Patient has PICC line in place.  Discussed with wife administration of antibiotics and care of PICC line.  I emphasized using clean technique with every handling of PICC line.  Patient and wife endorsed understanding.  Discussed importance of bowel regimen and avoiding narcotic induced constipation.  I recommend continuing senna p.o. daily and MiraLAX until BM is more regular.  Encouraged ambulation and increased hydration to avoid constipation.  Reviewed pain medication regimen.  On discharge, recommend MS Contin 30 mg at 6 AM and 2 PM.  I also recommend MS Contin 60 mg at 10 PM.  I would also continue Celebrex 200 mg twice daily.  For breakthrough pain, I recommend Dilaudid 2 mg p.o. every 6 hours.  Discussed use of MS Contin to manage pain in hopes of tapering off of it.  Questions and concerns were addressed.  Outpatient palliative services were recommended and referral placed by attending.  PMT will continue to follow the patient throughout his hospitalization.  Plan is set for discharge today.  Code Status: Full code  Prognosis: Unable to determine  Discharge Planning: Home with Palliative Services  Recommendations/Plan: MS Contin '30mg'$  twice daily - 6am, 2pm MS Contin '60mg'$  2200 Celebrex '200mg'$  BID Senna 1 tab PO QAM Dilaudid '2mg'$  PO PRN for breakthrough pain  Care plan was discussed with patient, patient's wife, Dr. Priscella Mann  Physical Exam Vitals reviewed.  Constitutional:      General: He is not in acute distress.    Appearance: Normal appearance. He is normal weight.  He is not ill-appearing or toxic-appearing.  HENT:     Head: Normocephalic.     Mouth/Throat:     Mouth: Mucous membranes are moist.  Eyes:     Pupils: Pupils are equal, round, and reactive to light.  Cardiovascular:     Rate and Rhythm: Normal rate.     Pulses: Normal pulses.  Pulmonary:     Effort: Pulmonary effort is normal.  Abdominal:     Palpations: Abdomen is soft.  Musculoskeletal:        General: Normal range of motion.  Skin:    General: Skin is warm and dry.  Neurological:     Mental Status: He is alert and oriented to person, place, and time.  Psychiatric:        Mood and Affect: Mood normal.        Behavior: Behavior normal.        Thought Content: Thought content normal.        Judgment: Judgment normal.             Palliative Assessment/Data: 70%    Total Time 35 minutes  Greater than 50%  of this time was spent counseling and coordinating care related to the above assessment and plan.  Thank you for allowing the Palliative Medicine Team to assist in the care of this patient.  Kamiah Ilsa Iha, FNP-BC Palliative Medicine Team Team Phone # [phone]

## 2021-07-12 NOTE — Progress Notes (Addendum)
Peripherally Inserted Central Catheter Placement  The IV Nurse has discussed with the patient and/or persons authorized to consent for the patient, the purpose of this procedure and the potential benefits and risks involved with this procedure.  The benefits include less needle sticks, lab draws from the catheter, and the patient may be discharged home with the catheter. Risks include, but not limited to, infection, bleeding, blood clot (thrombus formation), and puncture of an artery; nerve damage and irregular heartbeat and possibility to perform a PICC exchange if needed/ordered by physician.  Alternatives to this procedure were also discussed.  Bard Power PICC patient education guide, fact sheet on infection prevention and patient information card has been provided to patient /or left at bedside.    PICC Placement Documentation  PICC Single Lumen 38/75/64 Right Basilic 38 cm 0 cm (Active)  Indication for Insertion or Continuance of Line Home intravenous therapies (PICC only) 07/12/21 0955  Exposed Catheter (cm) 0 cm 07/12/21 0955  Site Assessment Clean, Dry, Intact 07/12/21 0955  Line Status Flushed;Saline locked;Blood return noted 07/12/21 0955  Dressing Type Transparent;Securing device 07/12/21 0955  Dressing Status Antimicrobial disc in place 07/12/21 0955  Safety Lock Not Applicable 33/29/51 8841  Line Care Connections checked and tightened 07/12/21 0955  Line Adjustment (NICU/IV Team Only) No 07/12/21 0955  Dressing Intervention New dressing 07/12/21 0955  Dressing Change Due 07/19/21 07/12/21 [place] 07/12/2021, 9:57 AM

## 2021-07-12 NOTE — Progress Notes (Signed)
PT Cancellation Note  Patient Details Name: Andrew Mullins MRN: [id number] DOB: February 19, 1955   Cancelled Treatment:    Reason Eval/Treat Not Completed: Other (comment) PT attempted this morning with patient politely declining. Pt reported having a very busy morning and just had the PICC line placed. Pt stated he feels he has met all of his goals for PT and is not concerned about ascending 3 steps into his home. Pt reported discharging from the hospital this afternoon. Pt stated he has a RW to use at home if needed, and his brother has also arrived to assist at discharge. Will continue to progress next date if pt remains hospitalized.  Sapphira Harjo, SPT [place] 07/12/2021, 9:58 AM

## 2021-07-12 NOTE — Progress Notes (Signed)
ID Patient is doing much better No fever for the last 3 days Pain is better controlled He worked with PT and walked the corridor   On examination awake and alert BP (!) 187/96 (BP Location: Right Arm)   Pulse 86   Temp 98 F (36.7 C)   Resp 16   Ht 6' (1.829 m)   Wt 93 kg   SpO2 98%   BMI 27.80 kg/m   Chest bilateral air entry Heart sounds S1-S2 Abdomen soft CNS nonfocal  Labs    Latest Ref Rng & Units 07/10/2021    6:15 AM 07/09/2021    7:05 AM 07/08/2021    7:00 AM  CBC  WBC 4.0 - 10.5 K/uL 9.6  16.2  8.4   Hemoglobin 13.0 - 17.0 g/dL 10.9  10.3  10.4   Hematocrit 39.0 - 52.0 % 33.4  31.2  31.2   Platelets 150 - 400 K/uL 312  282  274        Latest Ref Rng & Units 07/10/2021    6:15 AM 07/09/2021    7:05 AM 07/08/2021    7:00 AM  CMP  Glucose 70 - 99 mg/dL 110  114  126   BUN 8 - 23 mg/dL '18  18  12   '$ Creatinine 0.61 - 1.24 mg/dL 0.74  1.14  0.61   Sodium 135 - 145 mmol/L 133  127  129   Potassium 3.5 - 5.1 mmol/L 4.1  3.8  3.6   Chloride 98 - 111 mmol/L 99  93  94   CO2 22 - 32 mmol/L '29  27  29   '$ Calcium 8.9 - 10.3 mg/dL 9.0  8.7  8.6     Micro 06/28/21 Streptococcus anginosus in both sets of blood cultures 4 out of 4 07/02/21 BC 4/4 NG 07/02/21 epidural fluid culture- strep anginosus 07/04/21 BC 1/4 prevotella 07/07/21 4/4 -NG   Impression/recommendation Streptococcus anginosus bacteremia on presentation and Prevotella bacteremia on 07/04/2021 What organisms are from the oral cavity l likely related to the esophageal stricture dilation and inflammation Microperforation was low into by esophagogram but none identified  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  Status post debridement Strep anginosus in the culture Patient is currently on ceftriaxone 2 g IV every 12 and metronidazole 5 mg IV every 8.  The metronidazole can be given as a pill but need to make sure that it can be crushed as the patient cannot swallow pills. He  will need a total of 6 weeks of antibiotics Patient can get PICC line tomorrow and plan for discharge tomorrow  History of esophageal carcinoma status postradiation, chemo and now has esophageal stricture which has been dilated 82 times in the past. Discussed the management with the patient and his wife and brother at bedside

## 2021-07-12 NOTE — TOC Transition Note (Signed)
Transition of Care [facility]) - CM/SW Discharge Note   Patient Details  Name: Andrew Mullins MRN: [id number] Date of Birth: [birth year]  [facility] CM/SW Contact:  Eileen Stanford, LCSW Phone Number: 07/12/2021, 11:50 AM   Clinical Narrative:  DC today. [facility] notified.        Barriers to Discharge: Continued Medical Work up   Patient Goals and CMS Choice        Discharge Placement                       Discharge Plan and Services   Discharge Planning Services: CM Consult            DME Arranged: N/A DME Agency: NA       HH Arranged: PT, RN Ila Agency: [place] ([facility]) Date HH Agency Contacted: 07/03/21 Time [place]: 1238 Representative spoke with at [place]: [place] ([place]) Interventions     Readmission Risk Interventions     No data to display

## 2021-07-12 NOTE — Treatment Plan (Signed)
Diagnosis: Streptococcus anginosus bacteremia with discitis and spinal epidural abscess Prevotella bacteremia Baseline Creatinine <1     Allergies  Allergen Reactions   Hydralazine Other (See Comments)    Flushing, anxiety-per [facility] Flushing, anxiety, shaking after procedure.    OPAT Orders Ceftriaxone 2 grams IV every 12 hours  Metronidazole $RemoveBefore'500mg'[id number]$  tablet oral every 6 hours, can be crushed amd mixed with apple sauce Duration: 6 weeks End Date:08/12/21   [facility] Care Per Protocol:  Labs weekly while on IV antibiotics: _X_ CBC with differential  _X_ CMP _X_ CRP _X_ ESR   _X_ Please pull PIC at completion of IV antibiotics   Fax weekly lab results  promptly to [phone]  Clinic Follow Up Appt: 08/01/21 at [place]   Call [phone] with any questions

## 2021-07-12 NOTE — Discharge Summary (Signed)
Physician Discharge Summary  Haward D Reamy MRN:[id number] DOB: 05/11/1955 DOA: 06/28/2021  PCP: Johnston, John D, MD  Admit date: 06/28/2021 Discharge date: 07/12/2021  Admitted From: Home Disposition: Home with home health  Recommendations for Outpatient Follow-up:  Follow up with PCP in 1-2 weeks Follow-up outpatient palliative care  Home Health: Yes PT RN Equipment/Devices: PICC line  Discharge Condition: Stable CODE STATUS: Full Diet recommendation: Heart healthy  Brief/Interim Summary: Adam Bass is a 66 y.o. male with a known history of CVA with residual weakness, prostate CA, HTN, GERD< esophageal cancer  presents to the emergency department for evaluation of shaking chills and fever.  He was seen in our ER two days ago for symptoms of a pinched nerve in his back that started after doing work in the yard.  He was given muscle relaxant, lidocaine patch and discharged home.  He returns today with complaint of confusion and weakness. Has  been taking morphine 18mg PO daily for help with back pain.   Met sepsis criteria.  Strep coccal bacteremia noted source.  Likely secondary to multiple esophageal dilatation procedures.  Also found to have discitis/osteomyelitis.  Will discharge on outpatient antibiotics.  Neurosurgery and IR consulted for intervention.  Nothing further recommended.  Palliative care engaged for symptom management.  Recommend multimodal long and short acting opiate course.  Pain well controlled at time of discharge.  Patient will discharge home.  Significant medication burden.  Unfortunately pharmacy would only fill 1 week of MS Contin despite me writing for 2 weeks.  Patient, palliative care, RN are aware.  Pharmacy also aware.  Patient will discharge home with PICC line in place.  Follow-up outpatient PCP, palliative care, infectious disease.    Discharge Diagnoses:  Principal Problem:   Sepsis with Streptococcus bacteremia Active Problems:    Discitis/osteomyelitis   E. coli UTI   Thrombocytopenia (HCC)   Hypokalemia   CVA (cerebral vascular accident) (HCC)   Essential hypertension   Hyponatremia   Esophageal stricture  Sepsis with Streptococcus bacteremia Patient met sepsis criteria with fever, tachycardia and tachypnea.  UA concerning for UTI.  Initial blood cultures with Streptococcus anginosus, history of recent esophageal dilatation, had total of 82 so far.   Urine cultures with E. Coli and good sensitivity. No fevers last 24. Unfortunately 1/4 BCs from 6/20 growing prevotella denticola. PICC was placed 6/22, removed 6/26 per ID. ID wants fever free (last fever 18:30 on 6/24) 72 hours prior to replacing picc.  Plan: Patient remained fever free.  PICC line placed 6/28.  Outpatient antibiotic orders placed by infectious disease.  Rocephin IV and metronidazole p.o.  1 month course prescribed.  Follow-up outpatient PCP, infectious disease   E. coli UTI Urine cultures growing E. Coli with good sensitivity.  Patient is already on ceftriaxone for Streptococcus bacteremia.  Only resistant to ampicillin and sulbactam. -Continue ceftriaxone as above   Discitis/osteomyelitis S/p hemilaminectomy involving T1-T2 for washout of epidural abscess on 07/02/2021.  Pain improved today.  Drain was removed by neurosurgery. MRI of thoracic and lumbar spine with concerning of discitis/osteomyelitis with some abscess involving T1-T5, cervical spine MRI cervical spine with no osteomyelitis involving the cervical spine but extension of phlegmon/abscess up to C4-5.  Pain not adequately controlled so on 6/23 repeat MRI performed showing persistent/evolving osteo of t1-2 with worsened epidural abscess, severe stenosis and associated cord signal. Neuro surg dr. Abd-el-bar has seen (note pending), says neuro exam is reassuring, declines further intervention for now, advises instead ongoing abx. Discussed with   IR , no role for IR intervention.  Plan: Long-term  outpatient antibiotics as above.  Palliative care engaged for symptom management recommendations.  Discharged on MS Contin and as needed Dilaudid.  Unfortunately pharmacy was only able to fill 1 week of the long-acting narcotic.  Palliative care aware.  Patient aware.  Will need to follow-up with PCP or palliative care to consider refills of this medication   Esophageal stricture Patient has a recent esophageal dilatation at [facility].  History of multiple esophageal dilations in the past.  CTA chest with concern of esophagitis. Might have post dilatation bacteremia. Esophogram no signs leak. Case discussed w/ Dr. Anna, advises no w/u inpatient -Continue with Protonix - outpt gi f/u  Discharge Instructions  Discharge Instructions     [facility] pharmacist to adjust dose for Vancomycin, Aminoglycosides and other anti-infective therapies as requested by physician.   Complete by: As directed    [facility] to provide Cath Flo 2mg   Complete by: As directed    Administer for PICC line occlusion and as ordered by physician for other access device issues.   Amb Referral to Palliative Care   Complete by: As directed    Anaphylaxis Kit: Provided to treat any anaphylactic reaction to the medication being provided to the patient if First Dose or when requested by physician   Complete by: As directed    Epinephrine 1mg/ml vial / amp: Administer 0.3mg (0.3ml) subcutaneously once for moderate to severe anaphylaxis, nurse to call physician and pharmacy when reaction occurs and call 911 if needed for immediate care   Diphenhydramine 50mg/ml IV vial: Administer 25-50mg IV/IM PRN for first dose reaction, rash, itching, mild reaction, nurse to call physician and pharmacy when reaction occurs   Sodium Chloride 0.9% NS 500ml IV: Administer if needed for hypovolemic blood pressure drop or as ordered by physician after call to physician with anaphylactic reaction   Change dressing on IV access line  weekly and PRN   Complete by: As directed    Diet - low sodium heart healthy   Complete by: As directed    Flush IV access with Sodium Chloride 0.9% and Heparin 10 units/ml or 100 units/ml   Complete by: As directed    Home infusion instructions - [facility]   Complete by: As directed    Instructions: Flush IV access with Sodium Chloride 0.9% and Heparin 10units/ml or 100units/ml   Change dressing on IV access line: Weekly and PRN   Instructions Cath Flo 2mg: Administer for PICC Line occlusion and as ordered by physician for other access device   [facility] pharmacist to adjust dose for: Vancomycin, Aminoglycosides and other anti-infective therapies as requested by physician   Increase activity slowly   Complete by: As directed    Method of administration may be changed at the discretion of home infusion pharmacist based upon assessment of the patient and/or caregiver's ability to self-administer the medication ordered   Complete by: As directed    No wound care   Complete by: As directed       Allergies as of 07/12/2021       Reactions   Hydralazine Other (See Comments)   Flushing, anxiety-per [facility] Flushing, anxiety, shaking after procedure.        Medication List     TAKE these medications    aspirin EC 81 MG tablet Take 81 mg by mouth daily. What changed: Another medication with the same name was removed. Continue taking   this medication, and follow the directions you see here.   atorvastatin 40 MG tablet Commonly known as: LIPITOR Take 1 tablet (40 mg total) by mouth daily at 6 PM.   cefTRIAXone  IVPB Commonly known as: ROCEPHIN Inject 2 g into the vein every 12 (twelve) hours. Indication:  Streptococcus Anginosus bacteremia with T1-T2 discitis spinal epidural phlegmon/abscess First Dose: Yes Last Day of Therapy:  08/12/2021 Labs (weekly): CBC/diff, CMP, ESR, CRP Please pull PIC at completion of IV antibiotics Fax weekly labs to [phone]  [phone] Method of administration: IV Push Method of administration may be changed at the discretion of home infusion pharmacist based upon assessment of the patient and/or caregiver's ability to self-administer the medication ordered.   celecoxib 200 MG capsule Commonly known as: CELEBREX Take 1 capsule (200 mg total) by mouth 2 (two) times daily for 14 days.   cyclobenzaprine 10 MG tablet Commonly known as: FLEXERIL Take 1 tablet (10 mg total) by mouth 3 (three) times daily as needed for muscle spasms.   famciclovir 250 MG tablet Commonly known as: FAMVIR Take 250 mg by mouth 2 (two) times daily.   gabapentin 100 MG capsule Commonly known as: NEURONTIN Take 200 mg by mouth 2 (two) times daily. What changed: Another medication with the same name was removed. Continue taking this medication, and follow the directions you see here.   HYDROmorphone 2 MG tablet Commonly known as: Dilaudid Take 1 tablet (2 mg total) by mouth every 6 (six) hours as needed for up to 5 days for severe pain.   ibuprofen 200 MG tablet Commonly known as: ADVIL Take 400-600 mg by mouth at bedtime as needed for moderate pain.   losartan 100 MG tablet Commonly known as: COZAAR Take 1 tablet (100 mg total) by mouth daily. What changed:  medication strength how much to take   metoprolol tartrate 25 MG tablet Commonly known as: LOPRESSOR Take 25 mg by mouth 2 (two) times daily.   metroNIDAZOLE 500 MG tablet Commonly known as: FLAGYL Take 1 tablet (500 mg total) by mouth every 6 (six) hours.   morphine 30 MG 12 hr tablet Commonly known as: MS CONTIN Take 1 tablet (30 mg total) by mouth 2 (two) times daily for 14 days.   morphine 60 MG 12 hr tablet Commonly known as: MS CONTIN Take 1 tablet (60 mg total) by mouth at bedtime for 14 days.   Multivitamin Adult Chew Chew 2 tablets by mouth every morning.   omeprazole 20 MG capsule Commonly known as: PRILOSEC Take 20 mg by mouth daily.    polyethylene glycol 17 g packet Commonly known as: MIRALAX / GLYCOLAX Take 17 g by mouth daily as needed for moderate constipation.   senna-docusate 8.6-50 MG tablet Commonly known as: Senokot-S Take 1 tablet by mouth daily for 14 days. Start taking on: July 13, 2021               Discharge Care Instructions  (From admission, onward)           Start     Ordered   07/12/21 0000  Change dressing on IV access line weekly and PRN  (Home infusion instructions - [facility] )        07/12/21 1212            Follow-up Information     Koch, Danielle Lee, PA. Go in 2 week(s).   Specialty: Neurosurgery Why: office will call pt. for follow-up appt. Contact information: [address] [place] [address] [phone]                Allergies  Allergen Reactions   Hydralazine Other (See Comments)    Flushing, anxiety-per [facility] Flushing, anxiety, shaking after procedure.    Consultations: Palliative care GI ID Neurosurgery  Procedures/Studies: [place] EKG SITE RITE  Result Date: 07/12/2021 If Site Rite image not attached, placement could not be confirmed due to current cardiac rhythm.  [place] EKG SITE RITE  Result Date: 07/12/2021 If [facility] image not attached, placement could not be confirmed due to current cardiac rhythm.  MR CERVICAL SPINE W WO CONTRAST  Result Date: 07/08/2021 CLINICAL DATA:  66 year old male with history of bacteremia, osteomyelitis discitis at T1-2 with epidural involvement, status post interval hemilaminectomy and epidural washout. Patient with persistent severe neck and back pain. EXAM: MRI CERVICAL AND THORACIC SPINE WITHOUT AND WITH CONTRAST TECHNIQUE: Multiplanar and multiecho pulse sequences of the cervical spine, to include the craniocervical junction and cervicothoracic junction, and the thoracic spine, were obtained without and with intravenous contrast. CONTRAST:  53m GADAVIST GADOBUTROL 1 MMOL/ML IV SOLN  COMPARISON:  Previous exams from 07/01/2021 and 06/30/2021. FINDINGS: MRI CERVICAL SPINE FINDINGS Alignment: Straightening of the normal cervical lordosis with trace degenerative retrolisthesis of C3 on C4, stable. Vertebrae: Persistent and evolving findings of osteomyelitis discitis at T1-2. Since previous exam, associated marrow edema and enhancement involving the T1 and T2 vertebral bodies has progressed and worsened. No significant interval vertebral body height loss. Similar associated disc space height loss with persistent fluid signal within the intervening T1-2 interspace. Marrow edema extends to involve the right greater than left posterior elements at T1-2, which could be involved as well. Otherwise, no other evidence for new or distant infection within the cervical spine itself. Underlying bone marrow signal intensity heterogeneous without worrisome osseous lesion. Cord: Persistent epidural abscess/phlegmon again seen about the cervicothoracic junction. Epidural enhancement again seen to extend cephalad to approximately the level of C4-5. Irregular epidural abscess within the left dorsal epidural space at the level of C7-T1 has increased in size, now measuring 1.4 x 0.8 x 2.4 cm (transverse by AP by craniocaudad) (series 4, image 29 on axial T2 weighted sequence, series 8, image 7 on postcontrast sagittal sequence). Worsened mass effect on the adjacent left dorsal cord which is flattened and deviated to the right (series 4, image 30). Up to severe spinal stenosis at the levels of C7-T1 and T1-2. Additional abscess within the ventral epidural space at the level of T1-2 noted, more fully characterized on corresponding thoracic spine portion of this exam (series 8, image 7). No visible cord signal changes. Posterior Fossa, vertebral arteries, paraspinal tissues: Postsurgical changes from interval hemilaminectomy noted within the partially visualized upper posterior thoracic spinal soft tissues. Small amount  of fluid seen along the surgical incision. Paraspinous edema adjacent to the infected T1-2 interspace without visible paraspinous abscess or collection. Normal flow voids seen within the vertebral arteries bilaterally. Disc levels: C2-C3: Right-sided uncovertebral spurring without significant disc bulge. Mild facet hypertrophy. No spinal stenosis. No significant stenosis. C3-C4: Intervertebral disc space narrowing with circumferential disc osteophyte complex and facet hypertrophy. No spinal stenosis. Severe bilateral C4 foraminal narrowing. C4-C5: Disc bulge with right worse than left uncovertebral spurring. Bilateral facet hypertrophy. Tiny right paracentral disc protrusion again noted. No significant spinal stenosis. Severe right worse than left C5 foraminal narrowing. C5-C6: Circumferential disc osteophyte complex with mild flattening of the ventral thecal sac. Trace epidural phlegmon noted posteriorly.  No significant spinal stenosis. Severe bilateral C6 foraminal narrowing. C6-C7: Disc bulge with right worse than left uncovertebral spurring. Posterior epidural abscess measures up to 3.5 mm in AP diameter at the left dorsal epidural space (series 4, image 24). Mild spinal stenosis with severe bilateral C7 foraminal narrowing. C7-T1: Negative interspace. Worsened dorsal epidural abscess as above. Secondary flattening of the left dorsal cord which is also slightly deviated ventrally and to the right. No visible cord signal changes. Severe spinal stenosis. Foramina remain patent. MRI THORACIC SPINE FINDINGS Alignment: Vertebral bodies normally aligned with preservation of the normal thoracic kyphosis. Vertebrae: Persistent and evolving findings of osteomyelitis discitis at T1-2. Associated marrow edema and enhancement has progressed and worsened since previous. No interval vertebral body fracture or significant height loss. Postoperative changes from interval right hemi laminectomy at T1-2. No other evidence for new  or distant sites of infection elsewhere within the thoracic spine. Underlying bone marrow signal intensity diffusely heterogeneous without worrisome osseous lesion. Probable post radiation changes noted within the upper thoracic spine. Cord: Epidural phlegmon abscess involving the upper cervicothoracic region again seen. Dorsal component of this collection described on corresponding cervical spine portion of this report. The ventral epidural collection positioned at the level of T1-2 measures 0.5 x 1.0 x 1.6 cm in greatest dimensions (AP by transverse by craniocaudad) (series 20, image 5 on axial T2 weighted sequence, series 23, image 11 on postcontrast sagittal sequence). This in conjunction with the dorsal epidural collection resultant fairly severe spinal stenosis with flattening of the upper thoracic cord, progressed from prior. Now seen is cord signal changes extending a short dense since inferiorly from this level (series 20, image 8). Paraspinal and other soft tissues: Postoperative changes from interval right hemi laminectomy at T1-2. Small collection within the subcutaneous fat along the surgical incision measures 2.9 x 1.1 cm, favored to reflect a benign postoperative seroma. Paraspinous edema and phlegmonous change seen adjacent to the T1-2 level without visible discrete abscess. Moderate layering bilateral pleural effusions. Disc levels: Lower thoracic spondylosis at T10-11 and T11-12 without significant spinal stenosis, but with mild to moderate bilateral foraminal narrowing again noted, stable. No other new or progressive spondylosis elsewhere within the thoracic spine. She IMPRESSION: 1. Persistent and evolving findings of osteomyelitis discitis at T1-2 with progressive and worsened epidural abscess as above. Resultant severe spinal stenosis at C7-T1 and T1-T2, progressed and worsened. Associated cord signal changes now seen within the upper thoracic cord at T2-3. 2. Interval right hemi laminectomy at  T1-2. No other evidence for new or distant infection elsewhere within the cervicothoracic spine. 3. Moderate layering bilateral pleural effusions. 4. Underlying moderate multilevel cervical spondylosis, stable. Results were called by telephone at the time of interpretation on 07/08/2021 at 2:46 am to provider Brenda Morrison, who verbally acknowledged these results. Electronically Signed   By: Benjamin  McClintock M.D.   On: 07/08/2021 02:49   MR THORACIC SPINE W WO CONTRAST  Result Date: 07/08/2021 CLINICAL DATA:  65-year-old male with history of bacteremia, osteomyelitis discitis at T1-2 with epidural involvement, status post interval hemilaminectomy and epidural washout. Patient with persistent severe neck and back pain. EXAM: MRI CERVICAL AND THORACIC SPINE WITHOUT AND WITH CONTRAST TECHNIQUE: Multiplanar and multiecho pulse sequences of the cervical spine, to include the craniocervical junction and cervicothoracic junction, and the thoracic spine, were obtained without and with intravenous contrast. CONTRAST:  9mL GADAVIST GADOBUTROL 1 MMOL/ML IV SOLN COMPARISON:  Previous exams from 07/01/2021 and 06/30/2021. FINDINGS: MRI CERVICAL SPINE FINDINGS Alignment: Straightening   of the normal cervical lordosis with trace degenerative retrolisthesis of C3 on C4, stable. Vertebrae: Persistent and evolving findings of osteomyelitis discitis at T1-2. Since previous exam, associated marrow edema and enhancement involving the T1 and T2 vertebral bodies has progressed and worsened. No significant interval vertebral body height loss. Similar associated disc space height loss with persistent fluid signal within the intervening T1-2 interspace. Marrow edema extends to involve the right greater than left posterior elements at T1-2, which could be involved as well. Otherwise, no other evidence for new or distant infection within the cervical spine itself. Underlying bone marrow signal intensity heterogeneous without worrisome  osseous lesion. Cord: Persistent epidural abscess/phlegmon again seen about the cervicothoracic junction. Epidural enhancement again seen to extend cephalad to approximately the level of C4-5. Irregular epidural abscess within the left dorsal epidural space at the level of C7-T1 has increased in size, now measuring 1.4 x 0.8 x 2.4 cm (transverse by AP by craniocaudad) (series 4, image 29 on axial T2 weighted sequence, series 8, image 7 on postcontrast sagittal sequence). Worsened mass effect on the adjacent left dorsal cord which is flattened and deviated to the right (series 4, image 30). Up to severe spinal stenosis at the levels of C7-T1 and T1-2. Additional abscess within the ventral epidural space at the level of T1-2 noted, more fully characterized on corresponding thoracic spine portion of this exam (series 8, image 7). No visible cord signal changes. Posterior Fossa, vertebral arteries, paraspinal tissues: Postsurgical changes from interval hemilaminectomy noted within the partially visualized upper posterior thoracic spinal soft tissues. Small amount of fluid seen along the surgical incision. Paraspinous edema adjacent to the infected T1-2 interspace without visible paraspinous abscess or collection. Normal flow voids seen within the vertebral arteries bilaterally. Disc levels: C2-C3: Right-sided uncovertebral spurring without significant disc bulge. Mild facet hypertrophy. No spinal stenosis. No significant stenosis. C3-C4: Intervertebral disc space narrowing with circumferential disc osteophyte complex and facet hypertrophy. No spinal stenosis. Severe bilateral C4 foraminal narrowing. C4-C5: Disc bulge with right worse than left uncovertebral spurring. Bilateral facet hypertrophy. Tiny right paracentral disc protrusion again noted. No significant spinal stenosis. Severe right worse than left C5 foraminal narrowing. C5-C6: Circumferential disc osteophyte complex with mild flattening of the ventral thecal  sac. Trace epidural phlegmon noted posteriorly. No significant spinal stenosis. Severe bilateral C6 foraminal narrowing. C6-C7: Disc bulge with right worse than left uncovertebral spurring. Posterior epidural abscess measures up to 3.5 mm in AP diameter at the left dorsal epidural space (series 4, image 24). Mild spinal stenosis with severe bilateral C7 foraminal narrowing. C7-T1: Negative interspace. Worsened dorsal epidural abscess as above. Secondary flattening of the left dorsal cord which is also slightly deviated ventrally and to the right. No visible cord signal changes. Severe spinal stenosis. Foramina remain patent. MRI THORACIC SPINE FINDINGS Alignment: Vertebral bodies normally aligned with preservation of the normal thoracic kyphosis. Vertebrae: Persistent and evolving findings of osteomyelitis discitis at T1-2. Associated marrow edema and enhancement has progressed and worsened since previous. No interval vertebral body fracture or significant height loss. Postoperative changes from interval right hemi laminectomy at T1-2. No other evidence for new or distant sites of infection elsewhere within the thoracic spine. Underlying bone marrow signal intensity diffusely heterogeneous without worrisome osseous lesion. Probable post radiation changes noted within the upper thoracic spine. Cord: Epidural phlegmon abscess involving the upper cervicothoracic region again seen. Dorsal component of this collection described on corresponding cervical spine portion of this report. The ventral epidural collection positioned at  the level of T1-2 measures 0.5 x 1.0 x 1.6 cm in greatest dimensions (AP by transverse by craniocaudad) (series 20, image 5 on axial T2 weighted sequence, series 23, image 11 on postcontrast sagittal sequence). This in conjunction with the dorsal epidural collection resultant fairly severe spinal stenosis with flattening of the upper thoracic cord, progressed from prior. Now seen is cord signal  changes extending a short dense since inferiorly from this level (series 20, image 8). Paraspinal and other soft tissues: Postoperative changes from interval right hemi laminectomy at T1-2. Small collection within the subcutaneous fat along the surgical incision measures 2.9 x 1.1 cm, favored to reflect a benign postoperative seroma. Paraspinous edema and phlegmonous change seen adjacent to the T1-2 level without visible discrete abscess. Moderate layering bilateral pleural effusions. Disc levels: Lower thoracic spondylosis at T10-11 and T11-12 without significant spinal stenosis, but with mild to moderate bilateral foraminal narrowing again noted, stable. No other new or progressive spondylosis elsewhere within the thoracic spine. She IMPRESSION: 1. Persistent and evolving findings of osteomyelitis discitis at T1-2 with progressive and worsened epidural abscess as above. Resultant severe spinal stenosis at C7-T1 and T1-T2, progressed and worsened. Associated cord signal changes now seen within the upper thoracic cord at T2-3. 2. Interval right hemi laminectomy at T1-2. No other evidence for new or distant infection elsewhere within the cervicothoracic spine. 3. Moderate layering bilateral pleural effusions. 4. Underlying moderate multilevel cervical spondylosis, stable. Results were called by telephone at the time of interpretation on 07/08/2021 at 2:46 am to provider Sharion Settler, who verbally acknowledged these results. Electronically Signed   By: Jeannine Boga M.D.   On: 07/08/2021 02:49   [place] EKG SITE RITE  Result Date: 07/06/2021 If [facility] image not attached, placement could not be confirmed due to current cardiac rhythm.  [place] EKG SITE RITE  Result Date: 07/06/2021 If [facility] image not attached, placement could not be confirmed due to current cardiac rhythm.  DG ESOPHAGUS W SINGLE CM (SOL OR THIN BA)  Result Date: 07/05/2021 INDICATION: Patient with multiple esophageal stricture  dilations in the past with most recent 2 weeks ago and new onset of thoracic discitis and abscess, request received for barium swallow to evaluate for esophageal perforation. EXAM: ESOPHAGUS/BARIUM SWALLOW/TABLET STUDY PROCEDURE: Single contrast examination was performed using Omnipaque 300. FLUOROSCOPY TIME:  Radiation Exposure Index (as provided by the fluoroscopic device): 91.30 mGy COMPARISON:  MR thoracic spine 06/30/2021. FINDINGS: Single contrast examination performed with Omnipaque 300 of the esophagus and gastroesophageal junction for evaluation of perforation. Scout radiograph fluoro saved images were taken prior to contrast administration. In the upright AP, lateral and oblique positions the patient was administered contrast by mouth. Normal opacification of the esophagus. Contrast empties into the stomach without delay. No extraluminal contrast was seen to suggest an esophageal perforation. COMPLICATIONS: NONE. IMPRESSION: 1. No extraluminal contrast was seen to suggest an esophageal perforation. This exam was performed by Tsosie Billing PA-C, and was supervised and interpreted by Dr. Serafina Royals. Electronically Signed   By: Ruthann Cancer M.D.   On: 07/05/2021 16:43   DG Thoracic Spine 2 View  Result Date: 07/02/2021 CLINICAL DATA:  Thoracic T1-T2 hemilaminectomy and epidural washout EXAM: THORACIC SPINE 2 VIEWS COMPARISON:  MRI 06/30/2021 FINDINGS: A single C-arm fluoroscopic image was obtained intraoperatively and submitted for post operative interpretation. Frontal image of the cervicothoracic junction demonstrates surgical instrumentation overlying the T1 level. 1 second of fluoroscopy time utilized. Radiation dose: Not provided. Please see the performing provider's  procedural report for further detail. IMPRESSION: As above. Electronically Signed   By: Davina Poke D.O.   On: 07/02/2021 16:24   DG C-Arm 1-60 Min-No Report  Result Date: 07/02/2021 Fluoroscopy was utilized by the requesting  physician.  No radiographic interpretation.   ECHOCARDIOGRAM COMPLETE  Result Date: 07/01/2021    ECHOCARDIOGRAM REPORT   Patient Name:   JARVIN OGREN Date of Exam: 07/01/2021 Medical Rec #:  [id number]          Height:       72.0 in Accession #:    [id number]         Weight:       205.0 lb Date of Birth:  Apr 24, 1955          BSA:          2.153 m Patient Age:    53 years           BP:           96/53 mmHg Patient Gender: M                  HR:           60 bpm. Exam Location:  [facility] Procedure: 2D Echo Indications:     Bacteremia R78.81  History:         Patient has prior history of Echocardiogram examinations, most                  recent 09/18/2018.  Sonographer:     Kathlen Brunswick RDCS Referring Phys:  [id number] AMIN Diagnosing Phys: Yolonda Kida MD IMPRESSIONS  1. Left ventricular ejection fraction, by estimation, is 55 to 60%. The left ventricle has normal function. The left ventricle has no regional wall motion abnormalities. There is moderate concentric left ventricular hypertrophy. Left ventricular diastolic parameters are consistent with Grade I diastolic dysfunction (impaired relaxation).  2. Right ventricular systolic function is normal. The right ventricular size is normal.  3. The mitral valve is normal in structure. Trivial mitral valve regurgitation.  4. The aortic valve is grossly normal. Aortic valve regurgitation is not visualized. FINDINGS  Left Ventricle: Left ventricular ejection fraction, by estimation, is 55 to 60%. The left ventricle has normal function. The left ventricle has no regional wall motion abnormalities. The left ventricular internal cavity size was normal in size. There is  moderate concentric left ventricular hypertrophy. Left ventricular diastolic parameters are consistent with Grade I diastolic dysfunction (impaired relaxation). Right Ventricle: The right ventricular size is normal. No increase in right ventricular wall thickness. Right ventricular systolic  function is normal. Left Atrium: Left atrial size was normal in size. Right Atrium: Right atrial size was normal in size. Pericardium: There is no evidence of pericardial effusion. Mitral Valve: The mitral valve is normal in structure. Trivial mitral valve regurgitation. Tricuspid Valve: The tricuspid valve is grossly normal. Tricuspid valve regurgitation is trivial. Aortic Valve: The aortic valve is grossly normal. Aortic valve regurgitation is not visualized. Aortic valve peak gradient measures 9.2 mmHg. Pulmonic Valve: The pulmonic valve was normal in structure. Pulmonic valve regurgitation is not visualized. Aorta: The ascending aorta was not well visualized. IAS/Shunts: No atrial level shunt detected by color flow Doppler.  LEFT VENTRICLE PLAX 2D LVIDd:         4.63 cm     Diastology LVIDs:         3.18 cm     LV e' medial:    6.31 cm/s LV  PW:         1.57 cm     LV E/e' medial:  7.8 LV IVS:        1.33 cm     LV e' lateral:   12.50 cm/s LVOT diam:     2.20 cm     LV E/e' lateral: 3.9 LV SV:         82 LV SV Index:   38 LVOT Area:     3.80 cm  LV Volumes (MOD) LV vol d, MOD A2C: 86.0 ml LV vol d, MOD A4C: 60.2 ml LV vol s, MOD A2C: 32.5 ml LV vol s, MOD A4C: 25.6 ml LV SV MOD A2C:     53.5 ml LV SV MOD A4C:     60.2 ml LV SV MOD BP:      45.1 ml RIGHT VENTRICLE RV S prime:     18.70 cm/s TAPSE (M-mode): 2.3 cm LEFT ATRIUM             Index        RIGHT ATRIUM           Index LA diam:        3.90 cm 1.81 cm/m   RA Area:     19.50 cm LA Vol (A2C):   34.1 ml 15.84 ml/m  RA Volume:   51.40 ml  23.87 ml/m LA Vol (A4C):   49.3 ml 22.90 ml/m LA Biplane Vol: 43.6 ml 20.25 ml/m  AORTIC VALVE                 PULMONIC VALVE AV Area (Vmax): 2.85 cm     PV Vmax:       1.17 m/s AV Vmax:        152.00 cm/s  PV Peak grad:  5.5 mmHg AV Peak Grad:   9.2 mmHg LVOT Vmax:      114.00 cm/s LVOT Vmean:     73.600 cm/s LVOT VTI:       0.216 m  AORTA Ao Root diam: 3.50 cm Ao Asc diam:  3.70 cm MITRAL VALVE                TRICUSPID VALVE MV Area (PHT): 2.43 cm    TV Peak grad:   19.2 mmHg MV Decel Time: 312 msec    TV Vmax:        2.19 m/s MV E velocity: 49.30 cm/s MV A velocity: 64.30 cm/s  SHUNTS MV E/A ratio:  0.77        Systemic VTI:  0.22 m                            Systemic Diam: 2.20 cm Dwayne D Callwood MD Electronically signed by Dwayne D Callwood MD Signature Date/Time: 07/01/2021/4:12:51 PM    Final    MR CERVICAL SPINE W WO CONTRAST  Result Date: 07/01/2021 CLINICAL DATA:  Follow-up examination for osteomyelitis discitis. EXAM: MRI CERVICAL SPINE WITHOUT AND WITH CONTRAST TECHNIQUE: Multiplanar and multiecho pulse sequences of the cervical spine, to include the craniocervical junction and cervicothoracic junction, were obtained without and with intravenous contrast. CONTRAST:  10mL GADAVIST GADOBUTROL 1 MMOL/ML IV SOLN COMPARISON:  Comparison made with prior MRI of the thoracic spine from 06/30/2021. FINDINGS: Alignment: Straightening of the normal cervical lordosis. Trace degenerative retrolisthesis of C3 on C4. Vertebrae: Changes of acute osteomyelitis discitis at T1-2 again seen, not appreciably changed as compared to prior thoracic   MRI. Associated epidural phlegmon/abscess partially visualized. The superior extent of the dorsal epidural collection extends to approximately C4-5 (series 7, image 7). Elsewhere, there is diffuse epidural enhancement throughout the cervical spinal cord extending to the craniocervical junction. Associated up to moderate stenosis at T1-2 is similar. No other evidence for acute infection elsewhere within the cervical spine. Heterogeneous signal intensity seen throughout the visualized bone marrow. No worrisome osseous lesions. No other abnormal marrow edema or enhancement. Cord: Normal signal and morphology. Of note, previously question cord signal abnormality at the level of T1-2 is not seen on this exam, likely artifactual on prior study. Dorsal epidural abscess extending from C4-5  through the visualized upper thoracic spine. Additional ventral component noted at T1-2. Diffuse epidural enhancement elsewhere. No abnormal intramedullary enhancement. Posterior Fossa, vertebral arteries, paraspinal tissues: Visualized brain and posterior fossa demonstrate no acute finding. Craniocervical junction normal. Mild paraspinous edema about the cervicothoracic junction related to the adjacent infection. No loculated soft tissue collections. Normal flow voids seen within the vertebral arteries bilaterally. Disc levels: C2-C3: Right-sided uncovertebral spurring without significant disc bulge. Mild left-sided facet hypertrophy. No significant stenosis. C3-C4: Degenerative intervertebral disc space narrowing with circumferential disc osteophyte complex. Bilateral facet hypertrophy. No significant spinal stenosis. Severe right worse than left C4 foraminal narrowing. C4-C5: Disc bulge with right worse than left uncovertebral spurring. Superimposed tiny right paracentral disc protrusion (series 9, image 14). Bilateral facet hypertrophy. No significant spinal stenosis. Severe right worse than left C5 foraminal stenosis. C5-C6: Disc bulge with endplate and uncovertebral spurring. Secondary flattening and partial effacement of the ventral thecal sac. Small posterior epidural collection. Resultant mild spinal stenosis. Severe right worse than left C6 foraminal narrowing. C6-C7: Disc bulge with right worse than left uncovertebral spurring. Flattening and partial effacement of the ventral thecal sac, asymmetric to the right. Small posterior epidural collection. Mild spinal stenosis. Moderate right with mild left C7 foraminal narrowing. C7-T1: Negative interspace. Dorsal epidural collection measures up to 5 mm in AP diameter, most pronounced at the left dorsal epidural space (series 8, image 29). Moderate spinal stenosis. Foramina remain patent. T1-2 seen only on sagittal projection. Findings of osteomyelitis discitis.  Circumferential epidural phlegmon/abscess, most pronounced dorsally. Moderate spinal stenosis. Moderate bilateral foraminal narrowing. IMPRESSION: 1. 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 Associated dorsal epidural phlegmon/abscess extends cephalad to the level of C4-5, although diffuse epidural enhancement is seen throughout the cervical spine. Associated moderate spinal stenosis at C7-T1 and T1-2. Previously questioned cord signal abnormality on prior thoracic spine is not seen on this exam, likely artifactual on prior study. 2. No other evidence for acute infection elsewhere within the cervical spine. 3. Underlying degenerative spondylosis with resultant moderate to severe right worse than left C4 through C7 foraminal narrowing as above. Electronically Signed   By: Jeannine Boga M.D.   On: 07/01/2021 02:20   MR Lumbar Spine W Wo Contrast  Result Date: 06/30/2021 CLINICAL DATA:  Initial evaluation for lower back pain, lumbar radiculopathy. EXAM: MRI LUMBAR SPINE WITHOUT AND WITH CONTRAST TECHNIQUE: Multiplanar and multiecho pulse sequences of the lumbar spine were obtained without and with intravenous contrast. CONTRAST:  45m GADAVIST GADOBUTROL 1 MMOL/ML IV SOLN COMPARISON:  None available. FINDINGS: Segmentation: Standard. Lowest well-formed disc space labeled the L5-S1 level. Alignment: Mild dextroscoliosis. Trace retrolisthesis of L2 on L3 and L3 on L4, with trace anterolisthesis of L4 on L5 and L5 on S1. Findings likely chronic and facet mediated. Vertebrae: Mild  chronic compression deformity noted at the superior endplate of L1. No acute or subacute fracture. Multiple endplate Schmorl's node deformities seen within the lumbar spine, a few of which demonstrate associated marrow edema about the L1-2 interspace as well as at the inferior endplate of L3. Bone marrow signal intensity heterogeneous without worrisome osseous lesion.  No other abnormal marrow edema. No findings of osteomyelitis discitis or septic arthritis. Conus medullaris and cauda equina: Conus extends to the T12-L1 level. Conus and cauda equina appear normal. Paraspinal and other soft tissues: Mild edema noted within the posterior paraspinous musculature, which could reflect mild muscular injury/strain or possibly mild myositis. No loculated collections. Disc levels: L1-2: Degenerative intervertebral disc space narrowing with diffuse disc bulge. Endplate Schmorl's node deformity. Mild facet hypertrophy. No significant spinal stenosis. Mild bilateral L1 foraminal narrowing. L2-3: Disc bulge with disc desiccation. Mild to moderate facet hypertrophy. Prominence of the dorsal epidural fat. Resultant moderate spinal stenosis. Mild left L2 foraminal narrowing. Right neural foramen remains patent. L3-4: Degenerative intervertebral disc space narrowing with diffuse disc bulge and disc desiccation. Endplate Schmorl's node deformity. Superimposed small left foraminal disc protrusion contacts the exiting left L3 nerve root (series 6, image 12). Mild facet hypertrophy with prominence of the dorsal epidural fat. No significant spinal stenosis. Mild right with moderate left L3 foraminal narrowing. L4-5: Disc desiccation without significant disc bulge. Moderate left worse than right facet arthrosis. Probable 5 mm synovial cyst at the medial aspect of the left L4-5 facet (series 8, image 27). Resultant mild canal with moderate bilateral subarticular stenosis. Mild bilateral L4 foraminal narrowing. L5-S1: Degenerative intervertebral disc space narrowing with disc desiccation and mild disc bulge. Reactive endplate spurring. Mild facet hypertrophy. No significant spinal stenosis. Foramina remain patent. IMPRESSION: 1. Mild edema within the posterior paraspinous musculature, which could reflect mild muscular injury/strain or possibly mild myositis. No loculated collections. 2. No other evidence  for acute infection within the lumbar spine. 3. Disc bulge with facet hypertrophy at L2-3 with resultant moderate spinal stenosis. 4. Small left foraminal disc protrusion at L3-4, contacting and potentially affecting the exiting left L3 nerve root. 5. Moderate bilateral subarticular stenosis at L4-5 related to disc bulge and facet hypertrophy. Electronically Signed   By: Jeannine Boga M.D.   On: 06/30/2021 02:41   MR THORACIC SPINE W WO CONTRAST  Result Date: 06/30/2021 CLINICAL DATA:  Initial evaluation for chronic back pain, history of fever and sepsis. EXAM: MRI THORACIC WITHOUT AND WITH CONTRAST TECHNIQUE: Multiplanar and multiecho pulse sequences of the thoracic spine were obtained without and with intravenous contrast. CONTRAST:  25m GADAVIST GADOBUTROL 1 MMOL/ML IV SOLN COMPARISON:  None available. FINDINGS: Alignment: Physiologic with preservation of the normal thoracic kyphosis. No listhesis. Vertebrae: Abnormal fluid signal intensity seen within the T1-2 interspace. Associated edema and enhancement within the adjacent T1 and T2 vertebral bodies, consistent with acute osteomyelitis discitis. There is evidence for associated epidural phlegmon/abscess within the dorsal epidural space, extending from the visualized lower cervical spine to approximately T5 (series 22, image 7). This measures up to approximately 3 mm in maximal AP diameter at the level of T1-2 (series 18, image 4). Abnormal enhancement seen within the ventral epidural space as well, extending from C7-T2 3. No other evidence for acute infection within the thoracic spine. Bone marrow signal intensity otherwise heterogeneous but overall within normal limits. No worrisome osseous lesions. Cord: Question hazy signal abnormality within the cervicothoracic cord at the level of T1, best seen on sagittal T2 weighted sequence (series  15, image 8). There is no more than mild to moderate stenosis at this level, suggesting that this may be due to  venous/passive congestion due to the adjacent epidural process. Otherwise, signal intensity within the thoracic cord is within normal limits. Paraspinal and other soft tissues: Mild edema and enhancement seen within the paraspinous soft tissues adjacent to the T1-2 level, greater on the left. No visible loculated collections. Layering bilateral pleural effusions noted. Disc levels: T10-11: Right paracentral disc extrusion with superior migration (series 15, image 9). Mild flattening of the right ventral cord without cord signal changes. No significant spinal stenosis. Superimposed bilateral facet hypertrophy with resultant mild to moderate foraminal narrowing. T11-12: Minimal disc bulge with bilateral facet hypertrophy. No significant spinal stenosis. Mild to moderate left with moderate right foraminal stenosis. Otherwise, relatively ordinary for age multilevel disc desiccation seen elsewhere within the thoracic spine. No other significant stenosis. IMPRESSION: 1. Findings consistent with acute osteomyelitis discitis at the T1-2 level. Associated epidural phlegmon/abscess within the adjacent epidural space, most pronounced dorsally, extending from the visualized lower cervical spine to approximately T5. Resultant mild-to-moderate spinal stenosis. Correlation with dedicated MRI of the cervical spine suggested evaluate the full extent of this process. 2. Suspected associated subtle hazy signal abnormality within the cervicothoracic cord at the level of T1. Given the lack of overt cord compression or severe stenosis at these levels, this is suspected to be related to venous/passive congestion due to the adjacent epidural process. 3. No other evidence for acute infection elsewhere within the thoracic spine. 4. Layering bilateral pleural effusions. 5. Degenerative spondylosis and facet arthrosis at T10-11 and T11-12 with resultant mild to moderate foraminal stenosis. Electronically Signed   By: Benjamin  McClintock M.D.    On: 06/30/2021 02:32   DG Chest Port 1 View  Result Date: 06/28/2021 CLINICAL DATA:  Sepsis EXAM: PORTABLE CHEST 1 VIEW COMPARISON:  None Available. FINDINGS: The heart size and mediastinal contours are within normal limits. Both lungs are clear. The visualized skeletal structures are unremarkable. IMPRESSION: No active disease. Electronically Signed   By: Ashesh  Parikh M.D.   On: 06/28/2021 22:39   CT Angio Chest/Abd/Pel for Dissection W and/or Wo Contrast  Result Date: 06/27/2021 CLINICAL DATA:  Back/shoulder pain, shortness of breath. Possible acute aortic syndrome. EXAM: CT ANGIOGRAPHY CHEST, ABDOMEN AND PELVIS TECHNIQUE: Non-contrast CT of the chest was initially obtained. Multidetector CT imaging through the chest, abdomen and pelvis was performed using the standard protocol during bolus administration of intravenous contrast. Multiplanar reconstructed images and MIPs were obtained and reviewed to evaluate the vascular anatomy. RADIATION DOSE REDUCTION: This exam was performed according to the departmental dose-optimization program which includes automated exposure control, adjustment of the mA and/or kV according to patient size and/or use of iterative reconstruction technique. CONTRAST:  100mL OMNIPAQUE IOHEXOL 350 MG/ML SOLN COMPARISON:  None Available. FINDINGS: The patient was unable to raise his arms, resulting in streak artifact and mildly reduced signal to noise ratio. CTA CHEST FINDINGS Cardiovascular: The noncontrast images demonstrate no evidence of acute intramural hematoma. There is mild atherosclerotic calcification of the descending thoracic aorta. Ascending thoracic aorta 4.3 cm in diameter on image 81 series 5 compatible with aneurysm. No aortic dissection or acute aortic findings. There is some mild atherosclerosis in the brachiocephalic artery but no high-grade stenosis of the aortic arch branch vessels. Today's exam was not timed for optimal pulmonary arterial opacification, we do  not demonstrate a large or central pulmonary embolus in the pulmonary arterial tree. Mediastinum/Nodes: Wall   thickening and indistinct margins of the upper thoracic esophagus for example on image 22 series 9, nonspecific although esophagitis is a potential cause for this appearance. No pathologic adenopathy identified. Lungs/Pleura: Bandlike atelectasis or scarring in the right lower lobe. Musculoskeletal: Thoracic spondylosis. Review of the MIP images confirms the above findings. CTA ABDOMEN AND PELVIS FINDINGS VASCULAR Aorta: Generally mild atherosclerotic calcification. No abdominal aortic aneurysm. Celiac: Subtle proximal narrowing could relate to a small amount of soft plaque, but no high-grade stenosis is observed. SMA: Widely patent. Incidentally, the SMA provides the right hepatic artery. Renals: Minimal atheromatous plaque at the origin of the upper, dominant right renal artery. A small accessory right renal artery supplies the lower pole. Single left renal artery appears widely patent. IMA: Patent. Inflow: Moderate atherosclerotic calcification without substantial stenosis. Veins: Duplicated infrarenal IVC, a venous variant. Review of the MIP images confirms the above findings. NON-VASCULAR Hepatobiliary: Arterial phase appearance of the liver is within normal limits given the streak artifact from the patient's arm positioning. Gallbladder grossly unremarkable. Pancreas: Unremarkable Spleen: Unremarkable Adrenals/Urinary Tract: Both adrenal glands appear normal. Minimal scarring in the right kidney lower pole. Low position of the urinary bladder related to prostatectomy. No significant abnormal renal parenchymal enhancement. No definite urinary tract calculi. Stomach/Bowel: Mild wall thickening in the ascending colon on image 190 series 5 distributed nondistention. Sigmoid colon diverticulosis. Lymphatic: No pathologic adenopathy. Reproductive: Prostatectomy. Other: No supplemental non-categorized  findings. Musculoskeletal: Heterotopic ossification along the pelvic sidewalls. Benign-appearing lipoma in the right external oblique muscle, image 165 series 5. Incidental plate and screw fixator in the right distal radius. Loss of disc height at L5-S1. Schmorl's nodes along the inferior endplates of L1 and L3 with some mild chronic appearing anterior wedging at L1. Mild to moderate multilevel lumbar impingement due to spondylosis and degenerative disc disease. Review of the MIP images confirms the above findings. IMPRESSION: 1. No acute vascular findings. 2. 4.3 cm in diameter ascending thoracic aortic aneurysm. Recommend annual imaging followup by CTA or MRA. This recommendation follows 2010 ACCF/AHA/AATS/ACR/ASA/SCA/SCAI/SIR/STS/SVM Guidelines for the Diagnosis and Management of Patients with Thoracic Aortic Disease. Circulation. 2010; 121: U272-Z366. Aortic aneurysm NOS (ICD10-I71.9) 3. Generally mild degrees of atherosclerosis. Aortic Atherosclerosis (ICD10-I70.0). 4. Abnormal wall thickening and indistinct margins of the upper thoracic esophagus. The appearance is nonspecific, although esophagitis would be a potential cause. Correlate with any GI symptoms and patient history in determining whether follow up gastroenterology referral is warranted. 5. Other imaging findings of potential clinical significance: Duplicated infrarenal IVC. Sigmoid colon diverticulosis. 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 Multilevel lumbar impingement. Electronically Signed   By: Van Clines M.D.   On: 06/27/2021 12:17      Subjective: Seen and examined the day of discharge.  Stable no distress.  Pain well controlled.  PICC line in place.  Stable for discharge home.  Discharge Exam: Vitals:   07/12/21 0415 07/12/21 0750  BP: (!) 151/87 (!) 160/79  Pulse: 78 72  Resp: 16 16  Temp: 98.2 F (36.8 C) 97.8 F (36.6 C)  SpO2: 97% 99%   Vitals:   07/11/21 1557 07/11/21 1939 07/12/21 0415  07/12/21 0750  BP: (!) 149/82 (!) 187/96 (!) 151/87 (!) 160/79  Pulse: 70 86 78 72  Resp: _0 Temp: 98.7 F (37.1 C) 98 F (36.7 C) 98.2 F (36.8 C) 97.8 F (36.6 C)  TempSrc:    Oral  SpO2: 99% 98% 97% 99%  Weight:      Height:  General: Pt is alert, awake, not in acute distress Cardiovascular: RRR, S1/S2 +, no rubs, no gallops Respiratory: CTA bilaterally, no wheezing, no rhonchi Abdominal: Soft, NT, ND, bowel sounds + Extremities: no edema, no cyanosis    The results of significant diagnostics from this hospitalization (including imaging, microbiology, ancillary and laboratory) are listed below for reference.     Microbiology: Recent Results (from the past 240 hour(s))  Culture, blood (Routine X 2) w Reflex to ID Panel     Status: None   Collection Time: 07/04/21  9:48 AM   Specimen: BLOOD  Result Value Ref Range Status   Specimen Description BLOOD RIGHT ANTECUBITAL  Final   Special Requests   Final    BOTTLES DRAWN AEROBIC AND ANAEROBIC Blood Culture adequate volume   Culture   Final    NO GROWTH 5 DAYS Performed at [facility], [address] [place], [place] [address]    Report Status 07/09/2021 FINAL  Final  Culture, blood (Routine X 2) w Reflex to ID Panel     Status: Abnormal   Collection Time: 07/04/21  9:49 AM   Specimen: BLOOD  Result Value Ref Range Status   Specimen Description   Final    BLOOD LEFT ANTECUBITAL Performed at [facility], [address], [place], [place] [address]    Special Requests   Final    BOTTLES DRAWN AEROBIC AND ANAEROBIC Blood Culture adequate volume Performed at [facility], [address] [place], [place] [address]    Culture  Setup Time   Final    GRAM NEGATIVE RODS ANAEROBIC BOTTLE ONLY CRITICAL RESULT CALLED TO, READ BACK BY AND VERIFIED WITH: JASON ROBBINS _0  ON 07/06/21 SKL Performed at [facility], [address] [place], [place] [address]    Culture  (A)  Final    PREVOTELLA DENTICOLA BETA LACTAMASE POSITIVE Performed at [facility], [address] [place], [place] [address]    Report Status 07/08/2021 FINAL  Final  Blood Culture ID Panel (Reflexed)     Status: None   Collection Time: 07/04/21  9:49 AM  Result Value Ref Range Status   Enterococcus faecalis NOT DETECTED NOT DETECTED Final   Enterococcus Faecium NOT DETECTED NOT DETECTED Final   Listeria monocytogenes NOT DETECTED NOT DETECTED Final   Staphylococcus species NOT DETECTED NOT DETECTED Final   Staphylococcus aureus (BCID) NOT DETECTED NOT DETECTED Final   Staphylococcus epidermidis NOT DETECTED NOT DETECTED Final   Staphylococcus lugdunensis NOT DETECTED NOT DETECTED Final   Streptococcus species NOT DETECTED NOT DETECTED Final   Streptococcus agalactiae NOT DETECTED NOT DETECTED Final   Streptococcus pneumoniae NOT DETECTED NOT DETECTED Final   Streptococcus pyogenes NOT DETECTED NOT DETECTED Final   A.calcoaceticus-baumannii NOT DETECTED NOT DETECTED Final   Bacteroides fragilis NOT DETECTED NOT DETECTED Final   Enterobacterales NOT DETECTED NOT DETECTED Final   Enterobacter cloacae complex NOT DETECTED NOT DETECTED Final   Escherichia coli NOT DETECTED NOT DETECTED Final   Klebsiella aerogenes NOT DETECTED NOT DETECTED Final   Klebsiella oxytoca NOT DETECTED NOT DETECTED Final   Klebsiella pneumoniae NOT DETECTED NOT DETECTED Final   Proteus species NOT DETECTED NOT DETECTED Final   Salmonella species NOT DETECTED NOT DETECTED Final   Serratia marcescens NOT DETECTED NOT DETECTED Final   Haemophilus influenzae NOT DETECTED NOT DETECTED Final   Neisseria meningitidis NOT DETECTED NOT DETECTED Final   Pseudomonas aeruginosa NOT DETECTED NOT DETECTED Final   Stenotrophomonas maltophilia NOT  DETECTED NOT DETECTED Final   Candida albicans NOT DETECTED NOT DETECTED Final   Candida auris NOT DETECTED NOT DETECTED Final   Candida glabrata NOT DETECTED NOT  DETECTED Final   Candida krusei NOT DETECTED NOT DETECTED Final   Candida parapsilosis NOT DETECTED NOT DETECTED Final   Candida tropicalis NOT DETECTED NOT DETECTED Final   Cryptococcus neoformans/gattii NOT DETECTED NOT DETECTED Final    Comment: Performed at [facility], [address] [place], [place] [address]  Culture, blood (Routine X 2) w Reflex to ID Panel     Status: None   Collection Time: 07/07/21  5:25 AM   Specimen: BLOOD  Result Value Ref Range Status   Specimen Description BLOOD LEFT ARM  Final   Special Requests   Final    BOTTLES DRAWN AEROBIC AND ANAEROBIC Blood Culture adequate volume   Culture   Final    NO GROWTH 5 DAYS Performed at [facility], [address], [place], [place] [address]    Report Status 07/12/2021 FINAL  Final  Culture, blood (Routine X 2) w Reflex to ID Panel     Status: None   Collection Time: 07/07/21  5:29 AM   Specimen: BLOOD  Result Value Ref Range Status   Specimen Description BLOOD LEFT [facility]  Final   Special Requests   Final    BOTTLES DRAWN AEROBIC AND ANAEROBIC Blood Culture adequate volume   Culture   Final    NO GROWTH 5 DAYS Performed at [facility], [address] [place], [place] [address]    Report Status 07/12/2021 FINAL  Final     Labs: BNP (last 3 results) No results for input(s): "BNP" in the last 8760 hours. Basic Metabolic Panel: Recent Labs  Lab 07/07/21 0524 07/08/21 0700 07/09/21 0705 07/10/21 0615 07/12/21 0519  NA 131* 129* 127* 133* 134*  K 3.3* 3.6 3.8 4.1 3.7  CL 94* 94* 93* 99 98  CO2 _0 GLUCOSE 117* 126* 114* 110* 145*  BUN _1 CREATININE 0.51* 0.61 1.14 0.74 0.72  CALCIUM 8.7* 8.6* 8.7* 9.0 8.9  MG 2.0  --   --   --   --    Liver Function Tests: No results for input(s): "AST", "ALT", "ALKPHOS", "BILITOT", "PROT", "ALBUMIN" in the last 168 hours. No results for input(s): "LIPASE", "AMYLASE" in the last 168 hours. No results for  input(s): "AMMONIA" in the last 168 hours. CBC: Recent Labs  Lab 07/06/21 0509 07/07/21 0524 07/08/21 0700 07/09/21 0705 07/10/21 0615  WBC 11.7* 10.7* 8.4 16.2* 9.6  HGB 11.6* 11.5* 10.4* 10.3* 10.9*  HCT 33.9* 34.0* 31.2* 31.2* 33.4*  MCV 81.5 82.1 81.5 83.0 83.1  PLT 272 292 274 282 312   Cardiac Enzymes: No results for input(s): "CKTOTAL", "CKMB", "CKMBINDEX", "TROPONINI" in the last 168 hours. BNP: Invalid input(s): "POCBNP" CBG: Recent Labs  Lab 07/07/21 0739 07/08/21 2314  GLUCAP 108* 142*   D-Dimer No results for input(s): "DDIMER" in the last 72 hours. Hgb A1c No results for input(s): "HGBA1C" in the last 72 hours. Lipid Profile No results for input(s): "CHOL", "HDL", "LDLCALC", "TRIG", "CHOLHDL", "LDLDIRECT" in the last 72 hours. Thyroid function studies No results for input(s): "TSH", "T4TOTAL", "T3FREE", "THYROIDAB" in the last 72 hours.  Invalid input(s): "FREET3" Anemia work up No results for input(s): "VITAMINB12", "FOLATE", "FERRITIN", "TIBC", "IRON", "RETICCTPCT" in the last 72 hours. Urinalysis    Component Value Date/Time  COLORURINE AMBER (A) 06/28/2021 2216   APPEARANCEUR CLOUDY (A) 06/28/2021 2216   LABSPEC 1.020 06/28/2021 2216   PHURINE 5.0 06/28/2021 2216   GLUCOSEU NEGATIVE 06/28/2021 2216   HGBUR LARGE (A) 06/28/2021 2216   BILIRUBINUR NEGATIVE 06/28/2021 2216   KETONESUR NEGATIVE 06/28/2021 2216   PROTEINUR 100 (A) 06/28/2021 2216   NITRITE POSITIVE (A) 06/28/2021 2216   LEUKOCYTESUR MODERATE (A) 06/28/2021 2216   Sepsis Labs Recent Labs  Lab 07/07/21 0524 07/08/21 0700 07/09/21 0705 07/10/21 0615  WBC 10.7* 8.4 16.2* 9.6   Microbiology Recent Results (from the past 240 hour(s))  Culture, blood (Routine X 2) w Reflex to ID Panel     Status: None   Collection Time: 07/04/21  9:48 AM   Specimen: BLOOD  Result Value Ref Range Status   Specimen Description BLOOD RIGHT ANTECUBITAL  Final   Special Requests   Final     BOTTLES DRAWN AEROBIC AND ANAEROBIC Blood Culture adequate volume   Culture   Final    NO GROWTH 5 DAYS Performed at [facility], [address] [place], [place] [address]    Report Status 07/09/2021 FINAL  Final  Culture, blood (Routine X 2) w Reflex to ID Panel     Status: Abnormal   Collection Time: 07/04/21  9:49 AM   Specimen: BLOOD  Result Value Ref Range Status   Specimen Description   Final    BLOOD LEFT ANTECUBITAL Performed at [facility], [address] [place], [place] [address]    Special Requests   Final    BOTTLES DRAWN AEROBIC AND ANAEROBIC Blood Culture adequate volume Performed at [facility], [address] [place], [place] [address]    Culture  Setup Time   Final    GRAM NEGATIVE RODS ANAEROBIC BOTTLE ONLY CRITICAL RESULT CALLED TO, READ BACK BY AND VERIFIED WITH: JASON ROBBINS @2156 ON 07/06/21 SKL Performed at [facility], [address] [place], [place] [address]    Culture (A)  Final    PREVOTELLA DENTICOLA BETA LACTAMASE POSITIVE Performed at [facility], [address] [place],  [address]    Report Status 07/08/2021 FINAL  Final  Blood Culture ID Panel (Reflexed)     Status: None   Collection Time: 07/04/21  9:49 AM  Result Value Ref Range Status   Enterococcus faecalis NOT DETECTED NOT DETECTED Final   Enterococcus Faecium NOT DETECTED NOT DETECTED Final   Listeria monocytogenes NOT DETECTED NOT DETECTED Final   Staphylococcus species NOT DETECTED NOT DETECTED Final   Staphylococcus aureus (BCID) NOT DETECTED NOT DETECTED Final   Staphylococcus epidermidis NOT DETECTED NOT DETECTED Final   Staphylococcus lugdunensis NOT DETECTED NOT DETECTED Final   Streptococcus species NOT DETECTED NOT DETECTED Final   Streptococcus agalactiae NOT DETECTED NOT DETECTED Final   Streptococcus pneumoniae NOT DETECTED NOT DETECTED Final   Streptococcus pyogenes NOT DETECTED NOT DETECTED Final    A.calcoaceticus-baumannii NOT DETECTED NOT DETECTED Final   Bacteroides fragilis NOT DETECTED NOT DETECTED Final   Enterobacterales NOT DETECTED NOT DETECTED Final   Enterobacter cloacae complex NOT DETECTED NOT DETECTED Final   Escherichia coli NOT DETECTED NOT DETECTED Final   Klebsiella aerogenes NOT DETECTED NOT DETECTED Final   Klebsiella oxytoca NOT DETECTED NOT DETECTED Final   Klebsiella pneumoniae NOT DETECTED NOT DETECTED Final   Proteus species NOT DETECTED NOT DETECTED Final   Salmonella species NOT DETECTED NOT DETECTED Final   Serratia marcescens NOT DETECTED NOT DETECTED   Final   Haemophilus influenzae NOT DETECTED NOT DETECTED Final   Neisseria meningitidis NOT DETECTED NOT DETECTED Final   Pseudomonas aeruginosa NOT DETECTED NOT DETECTED Final   Stenotrophomonas maltophilia NOT DETECTED NOT DETECTED Final   Candida albicans NOT DETECTED NOT DETECTED Final   Candida auris NOT DETECTED NOT DETECTED Final   Candida glabrata NOT DETECTED NOT DETECTED Final   Candida krusei NOT DETECTED NOT DETECTED Final   Candida parapsilosis NOT DETECTED NOT DETECTED Final   Candida tropicalis NOT DETECTED NOT DETECTED Final   Cryptococcus neoformans/gattii NOT DETECTED NOT DETECTED Final    Comment: Performed at [facility], [address] [place], [place] [address]  Culture, blood (Routine X 2) w Reflex to ID Panel     Status: None   Collection Time: 07/07/21  5:25 AM   Specimen: BLOOD  Result Value Ref Range Status   Specimen Description BLOOD LEFT ARM  Final   Special Requests   Final    BOTTLES DRAWN AEROBIC AND ANAEROBIC Blood Culture adequate volume   Culture   Final    NO GROWTH 5 DAYS Performed at [facility], [address] [place], [place] [address]    Report Status 07/12/2021 FINAL  Final  Culture, blood (Routine X 2) w Reflex to ID Panel     Status: None   Collection Time: 07/07/21  5:29 AM   Specimen: BLOOD  Result Value Ref Range Status    Specimen Description BLOOD LEFT AC  Final   Special Requests   Final    BOTTLES DRAWN AEROBIC AND ANAEROBIC Blood Culture adequate volume   Culture   Final    NO GROWTH 5 DAYS Performed at [facility], [address] [place], [place] [address]    Report Status 07/12/2021 FINAL  Final     Time coordinating discharge: Over 30 minutes  SIGNED:   Sudheer B Sreenath, MD  [facility] 07/12/2021, 5:35 PM Pager   If 7PM-7AM, please contact night-coverage  

## 2021-07-12 NOTE — Progress Notes (Signed)
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 To discharging provider:  please sign these orders via discharge navigator,  Select New Orders & click on the button choice - Manage This Unsigned Work.     Thank you for allowing pharmacy to be a part of this patient's care.  Doreene Eland, PharmD, BCPS, BCIDP Work Cell: [phone] 07/12/2021 9:45 AM

## 2021-07-12 NOTE — Progress Notes (Signed)
[facility]) Hospital Liaison note:  Notified via [place] from Dr. Ralene Muskrat of request for [place] services. Will continue to follow for disposition.  Please call with any outpatient palliative questions or concerns.  Thank you for the opportunity to participate in this patient's care.  Thank you, Lorelee Market, LPN [facility] Liaison [phone]

## 2021-07-12 NOTE — Progress Notes (Signed)
Received Md order to discharge patient to home with homes health.  Reviewed discharge instructions, homes meds, prescriptions and follow up appointments with patient and wife and both  verbalized understanding

## 2021-07-13 ENCOUNTER — Telehealth: Payer: Self-pay

## 2021-07-13 NOTE — Telephone Encounter (Signed)
Called Dr. Sherren Mocha Baron's office and left a voicemail on their coordinator to call me back or for Dr. Lysle Rubens to contact Dr. Vicente Males on his cell phone so they could talk about patient's health and concerns.  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 However, last time he was admitted because of sepsis bacterium of his esophagus.

## 2021-07-14 ENCOUNTER — Telehealth: Payer: Self-pay | Admitting: Student

## 2021-07-14 NOTE — Telephone Encounter (Signed)
Rec'd voicemail from patient's wife regarding the Palliative referral, ret'd her call to schedule Consult, no answer - left message requesting a return call.

## 2021-07-14 NOTE — Telephone Encounter (Signed)
Returned call to patient's wife Macky Lower and we dicussed the Palliative referral/services and she was in agreement with beginning services with [place].  I have scheduled a MyChart Consult for 07/25/21 @ 11 AM.

## 2021-07-19 ENCOUNTER — Telehealth: Payer: Self-pay

## 2021-07-19 NOTE — Telephone Encounter (Signed)
Patient wife called wanting to ask if patient frequency of taking Flagyl could be changed to TID instead of QID due to dosing times interfering with her sleep schedule. Also wanted to inform Dr.Ravishankar that patient has a had a hand tremor x 3 days. No other symptoms.   [place], CMA

## 2021-07-19 NOTE — Telephone Encounter (Signed)
Patient wife Lucinda aware.

## 2021-07-21 ENCOUNTER — Telehealth: Payer: Self-pay | Admitting: Student

## 2021-07-21 ENCOUNTER — Telehealth: Payer: Self-pay

## 2021-07-21 NOTE — Telephone Encounter (Signed)
Palliative NP returned call to in patient palliative nurse Hildred Alamin and left message for patient's wife. Patient having increased difficulty swallowing. Hildred Alamin has reached out to PCP regarding request. Patient is scheduled to be seen for initial palliative visit on 07/25/21.

## 2021-07-21 NOTE — Telephone Encounter (Signed)
Looking to see if Dr. Lysle Rubens contacted Dr. Vicente Males on Care Everywhere and saw that they were able to talk about patient's care and that he will be able to perform a procedure scheduled on 07/27/2021.

## 2021-07-24 ENCOUNTER — Encounter: Payer: Self-pay | Admitting: Student

## 2021-07-25 ENCOUNTER — Telehealth: Payer: Medicare Other | Admitting: Student

## 2021-07-25 ENCOUNTER — Inpatient Hospital Stay: Payer: BC Managed Care – PPO | Admitting: Infectious Diseases

## 2021-07-25 DIAGNOSIS — R1310 Dysphagia, unspecified: Secondary | ICD-10-CM

## 2021-07-25 DIAGNOSIS — M869 Osteomyelitis, unspecified: Secondary | ICD-10-CM

## 2021-07-25 DIAGNOSIS — M546 Pain in thoracic spine: Secondary | ICD-10-CM

## 2021-07-25 DIAGNOSIS — R52 Pain, unspecified: Secondary | ICD-10-CM

## 2021-07-25 DIAGNOSIS — Z515 Encounter for palliative care: Secondary | ICD-10-CM

## 2021-07-25 DIAGNOSIS — K222 Esophageal obstruction: Secondary | ICD-10-CM

## 2021-07-25 NOTE — Progress Notes (Signed)
[facility] Consult Note Telephone: [phone]  Fax: [phone]   Date of encounter: 07/25/21 11:25 AM PATIENT NAME: Andrew Mullins [place] [address]   [phone] (home)  DOB: 23-Jul-1955 MRN: [id number] PRIMARY CARE PROVIDER:    Baxter Hire, MD,  [place] [address] [phone]  REFERRING PROVIDER:   Baxter Hire, MD [place],  [place] [address] [phone]  RESPONSIBLE PARTY:    Contact Information     Name Relation Home Work Mobile   Smucker,lucinda Spouse [phone]  [phone]   [name], [name]          Due to the COVID-19 crisis, this visit was done via telemedicine from my office and it was initiated and consent by this patient and or family.  I connected with  Andrew Mullins OR PROXY on 07/25/2021 by a video enabled telemedicine application and verified that I am speaking with the correct person using two identifiers.   I discussed the limitations of evaluation and management by telemedicine. The patient expressed understanding and agreed to proceed.                                     ASSESSMENT AND PLAN / RECOMMENDATIONS:   Advance Care Planning/Goals of Care: Goals include to maximize quality of life and symptom management. Patient/health care surrogate gave his/her permission to discuss.Our advance care planning conversation included a discussion about:    The value and importance of advance care planning  Experiences with loved ones who have been seriously ill or have died  Exploration of personal, cultural or spiritual beliefs that might influence medical decisions  Exploration of goals of care in the event of a sudden injury or illness  MOST from introduced; plan to complete on next visit.  CODE STATUS: Full Code  Education provided on Palliative Medicine. Patient to have esophageal dilation on Thursday.    Symptom Management/Plan:  Esophageal stricture-patient with increased swallowing difficulty, dysphagia. Patient to have dilation Thursday. He has been having every 5 weeks, but time has shortened in frequency. He is to follow up as scheduled. Dysphagia-encourage soft foods, liquids. Continue nutritional supplements.   Osteomyelitis, T2-T3 discitis-continue IV rocephin and PO flagyl as directed; to receive up to 6 weeks. HH for PICC line care and labs. Follow up with ID as scheduled.   Pain-patient with thoracic pain, radiating down arm, pain with swallowing. Will add 12 mcg Duragesic patch to 25 mcg he is currently receiving = 37 mcg change every 72 hours. Recommend increasing liquid morphine from 5-10 mg every 6 hours PRN for breakthrough pain.    Follow up Palliative Care Visit: Palliative care will continue to follow for complex medical decision making, advance care planning, and clarification of goals. Return in 4-6 weeks or prn.   This visit was coded based on medical decision making (MDM).  PPS: 70%  HOSPICE ELIGIBILITY/DIAGNOSIS: TBD  Chief Complaint: Palliative medicine initial consult  HISTORY OF PRESENT ILLNESS:  Andrew Mullins is a 66 y.o. 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  Patient with PICC line; currently receiving Rocephin IV, flagyl PO. Has home health nurse coming in weekly for lab work and PICC line care.  Patient had worsening dysphagia and was unable to swallow MS contin and was changed to fentanyl patch and liquid morphine for breakthrough pain. Pain is currently 5-6/10; 10 at worst. Pain not currently managed well. Pain is to his thoracic region, radiates down his arm. He is eating soft foods, liquids, Ensure. He has lost around 20 pounds. He has  had 83 dilations; receiving dilation usually every 5 weeks. He uses walker for ambulation.   History obtained from review of EMR, discussion with primary team, and interview with family, facility staff/caregiver and/or Andrew Mullins.  I reviewed available labs, medications, imaging, studies and related documents from the EMR.  Records reviewed and summarized above.   ROS  10-point ROS is negative, except for the pertinent positives and negatives detailed per the HPI.  Physical Exam:  Constitutional: NAD General: frail appearing EYES: anicteric sclera, lids intact, no discharge  ENMT: intact hearing,  dentition intact Pulmonary: no increased work of breathing, no cough, room air GU: deferred MSK: all extremities, ambulatory Skin: no rashes or wounds on visible skin Neuro: no cognitive impairment Psych: non-anxious affect, A and O x 3 Hem/lymph/immuno: no widespread bruising CURRENT PROBLEM LIST:  Patient Active Problem List   Diagnosis Date Noted   E. coli UTI 06/30/2021   Sepsis with Streptococcus bacteremia 06/29/2021   Essential hypertension 06/29/2021   Thrombocytopenia ([facility]) 06/29/2021   Hypokalemia 06/29/2021   Hyponatremia 06/29/2021   Discitis/osteomyelitis 06/29/2021   Esophageal stricture 02/16/2020   Dysphagia 02/16/2020   CVA (cerebral vascular accident) ([facility]) 09/17/2018   Gastroesophageal reflux disease with esophagitis 11/04/2013   Esophageal cancer ([facility]) 09/11/2010   Prostate cancer ([facility]) 09/11/2010   PAST MEDICAL HISTORY:  Active Ambulatory Problems    Diagnosis Date Noted   CVA (cerebral vascular accident) ([facility]) 09/17/2018   Esophageal stricture 02/16/2020   Dysphagia 02/16/2020   Sepsis with Streptococcus bacteremia 06/29/2021   Essential hypertension 06/29/2021   Thrombocytopenia ([facility]) 06/29/2021   Hypokalemia 06/29/2021   Hyponatremia 06/29/2021   Discitis/osteomyelitis 06/29/2021   E. coli UTI 06/30/2021   Gastroesophageal reflux disease with  esophagitis 11/04/2013   Esophageal cancer ([facility]) 09/11/2010   Prostate cancer ([facility]) 09/11/2010   Resolved Ambulatory Problems    Diagnosis Date Noted   Acute CVA (cerebrovascular accident) ([facility]) 09/18/2018   Past Medical History:  Diagnosis Date   Cancer ([facility])    Complication of anesthesia    GERD (gastroesophageal reflux disease)    Hypertension    Stroke ([facility]) 2020   SOCIAL HX:  Social History   Tobacco Use   Smoking status: Former   Smokeless tobacco: Never  Substance Use Topics   Alcohol use: Not Currently   FAMILY HX: No family history on file.    ALLERGIES:  Allergies  Allergen Reactions   Hydralazine Other (See Comments)    Flushing, anxiety-per [facility] [place], anxiety, shaking after procedure.     PERTINENT MEDICATIONS:  Outpatient Encounter Medications as of 07/25/2021  Medication Sig   aspirin EC 81 MG tablet Take 81 mg by mouth daily.   atorvastatin (LIPITOR) 40 MG tablet Take 1 tablet (40 mg total) by mouth daily at 6 PM.   cefTRIAXone (ROCEPHIN) IVPB Inject 2 g into the vein every 12 (twelve) hours. Indication:  Streptococcus Anginosus bacteremia with T1-T2 discitis spinal epidural phlegmon/abscess First Dose: Yes Last Day of Therapy:  08/12/2021 Labs (weekly): CBC/diff, CMP, ESR, CRP Please pull PIC at completion of IV antibiotics Fax weekly labs to [phone] Method of administration: IV Push Method  of administration may be changed at the discretion of home infusion pharmacist based upon assessment of the patient and/or caregiver's ability to self-administer the medication ordered.   celecoxib (CELEBREX) 200 MG capsule Take 1 capsule (200 mg total) by mouth 2 (two) times daily for 14 days.   cyclobenzaprine (FLEXERIL) 10 MG tablet Take 1 tablet (10 mg total) by mouth 3 (three) times daily as needed for muscle spasms.   famciclovir (FAMVIR) 250 MG tablet Take 250 mg by mouth 2 (two) times daily.   gabapentin (NEURONTIN) 100 MG capsule Take 200 mg by  mouth 2 (two) times daily.   ibuprofen (ADVIL) 200 MG tablet Take 400-600 mg by mouth at bedtime as needed for moderate pain.   losartan (COZAAR) 100 MG tablet Take 1 tablet (100 mg total) by mouth daily.   metoprolol tartrate (LOPRESSOR) 25 MG tablet Take 25 mg by mouth 2 (two) times daily.   metroNIDAZOLE (FLAGYL) 500 MG tablet Take 1 tablet (500 mg total) by mouth every 6 (six) hours.   morphine (MS CONTIN) 30 MG 12 hr tablet Take 1 tablet (30 mg total) by mouth 2 (two) times daily for 14 days.   morphine (MS CONTIN) 60 MG 12 hr tablet Take 1 tablet (60 mg total) by mouth at bedtime for 14 days.   Multiple Vitamins-Minerals (MULTIVITAMIN ADULT) CHEW Chew 2 tablets by mouth every morning.   omeprazole (PRILOSEC) 20 MG capsule Take 20 mg by mouth daily.   polyethylene glycol (MIRALAX / GLYCOLAX) 17 g packet Take 17 g by mouth daily as needed for moderate constipation.   senna-docusate (SENOKOT-S) 8.6-50 MG tablet Take 1 tablet by mouth daily for 14 days.   No facility-administered encounter medications on file as of 07/25/2021.   Thank you for the opportunity to participate in the care of Mr. Brodhead.  The palliative care team will continue to follow. Please call our office at [phone] if we can be of additional assistance.   Ezekiel Slocumb, NP   COVID-19 PATIENT SCREENING TOOL Asked and negative response unless otherwise noted:  Have you had symptoms of covid, tested positive or been in contact with someone with symptoms/positive test in the past 5-10 days? No

## 2021-08-01 ENCOUNTER — Inpatient Hospital Stay: Payer: Medicare Other | Admitting: Infectious Diseases

## 2021-08-03 ENCOUNTER — Other Ambulatory Visit: Payer: Medicare Other | Admitting: Student

## 2021-08-03 DIAGNOSIS — R52 Pain, unspecified: Secondary | ICD-10-CM

## 2021-08-03 DIAGNOSIS — K2100 Gastro-esophageal reflux disease with esophagitis, without bleeding: Secondary | ICD-10-CM

## 2021-08-03 DIAGNOSIS — R1310 Dysphagia, unspecified: Secondary | ICD-10-CM

## 2021-08-03 DIAGNOSIS — Z515 Encounter for palliative care: Secondary | ICD-10-CM

## 2021-08-04 NOTE — Progress Notes (Signed)
[facility] Consult Note Telephone: [phone]  Fax: [phone]    Date of encounter: 08/03/2021  PATIENT NAME: Andrew Mullins   [phone] (home)  DOB: 01-28-1955 MRN: [id number] PRIMARY CARE PROVIDER:    Baxter Hire, MD,  [place] [address] [phone]  REFERRING PROVIDER:   Baxter Hire, MD [place],   [address] [phone]  RESPONSIBLE PARTY:    Contact Information     Name Relation Home Work Mobile   Overall,lucinda Spouse [phone]  [phone]   [name], [name]          Due to the COVID-19 crisis, this home visit was done via telephone due to the patient's inability to connect via an audiovisual connection or their refusal to have an in-person visit. This connection was agreed to by the patient. Verified that I am speaking with the correct person using two identifiers.                                   ASSESSMENT AND PLAN / RECOMMENDATIONS:   Advance Care Planning/Goals of Care: Goals include to maximize quality of life and symptom management. Patient/health care surrogate gave his/her permission to discuss.  CODE STATUS: Full Code  Symptom Management/Plan:  Pain-due to osteomyelitis, discitis-patient having worsening breakthrough pain on days he is changing patch. Wife has been giving morphine 5 mg every 4-6 hours PRN. Will continue Duragesic 37.5 mcg patch; new script to be sent. Recommend Morphine 10 mg every 4 hours PRN, continue gabapentin 200 mg BID.   GERD-patient with GERD/reflux symptoms. Continue omeprazole 20 mg BID.   Dysphagia-status post dilation; he is tolerating soft foods. Next dilation scheduled in 3 weeks.   Follow up Palliative Care Visit: Palliative care will continue to follow for complex medical decision making, advance care planning, and clarification of goals. Return in 4  weeks or prn.  I spent 20 minutes providing this consultation. More than 50% of the time in this consultation was spent in counseling and care coordination.   HOSPICE ELIGIBILITY/DIAGNOSIS: TBD  Chief Complaint: Breakthrough pain  HISTORY OF PRESENT ILLNESS:  Andrew Mullins is a 66 y.o. 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   Patient reports worsening breakthrough pain when he changes his patch. He has been taking PRN morphine 5 mg every 4-6 hours. He has been tolerating soft foods since dilation. He does endorse reflux symptoms.   History obtained from review of EMR, discussion with primary team, and interview with family, facility staff/caregiver and/or Andrew Mullins.  I reviewed available labs, medications, imaging, studies and related documents from the EMR.  Records reviewed and summarized above.   ROS  10-point ROS is negative, except for the pertinent positives and negatives detailed per the HPI.   Physical Exam: PE deferred d/t this being a telemedicine visit.    Thank you for the opportunity to participate in the care of Andrew Mullins.  The palliative care team will continue to follow. Please call our office at [phone] if we can be of additional assistance.   Ezekiel Slocumb, NP   COVID-19 PATIENT SCREENING TOOL Asked and negative response unless otherwise noted:   Have you had  symptoms of covid, tested positive or been in contact with someone with symptoms/positive test in the past 5-10 days? No

## 2021-08-06 ENCOUNTER — Telehealth: Payer: Self-pay | Admitting: Student

## 2021-08-06 NOTE — Telephone Encounter (Signed)
Patient's wife states patches will not be available to Monday. Patient did okay during the day yesterday, but had worsening pain during the night. She has added back in Morphine ER. She is advised to remove any old patches, continue Morphine ER and liquid morphine every 4 hours PRN; increase liquid morphine to 15 mg every 4 hours for breakthrough pain. If no improvement, patient is advised to go to ED.

## 2021-08-06 NOTE — Telephone Encounter (Signed)
Multiple phone calls. NP spoke with patient's wife, pharmacy and [facility] regarding prior authorizations for his duragesic patches; request urgent PA approval. Patient will run out of 25 mcg patch on Saturday. Wife and patient are advised that if not approved over weekend, patient will go back to Morphine ER tablets as he is able to swallow currently and use liquid morphine for breakthrough pain 10 mg every 4 hours PRN. Plan for this until we can get patches approved.  Education provided on opioid usage, adverse effects, and discussed not switching back and forth between medications.

## 2021-08-08 ENCOUNTER — Encounter: Payer: Self-pay | Admitting: Infectious Diseases

## 2021-08-08 ENCOUNTER — Ambulatory Visit: Payer: Medicare Other | Attending: Infectious Diseases | Admitting: Infectious Diseases

## 2021-08-08 VITALS — BP 123/80 | HR 90 | Temp 97.0°F | Ht 72.0 in | Wt 193.0 lb

## 2021-08-08 DIAGNOSIS — M4624 Osteomyelitis of vertebra, thoracic region: Secondary | ICD-10-CM

## 2021-08-08 DIAGNOSIS — R7881 Bacteremia: Secondary | ICD-10-CM

## 2021-08-08 DIAGNOSIS — Z9221 Personal history of antineoplastic chemotherapy: Secondary | ICD-10-CM | POA: Diagnosis not present

## 2021-08-08 DIAGNOSIS — Z8501 Personal history of malignant neoplasm of esophagus: Secondary | ICD-10-CM | POA: Insufficient documentation

## 2021-08-08 DIAGNOSIS — K222 Esophageal obstruction: Secondary | ICD-10-CM | POA: Insufficient documentation

## 2021-08-08 DIAGNOSIS — Z923 Personal history of irradiation: Secondary | ICD-10-CM | POA: Diagnosis not present

## 2021-08-08 DIAGNOSIS — M4644 Discitis, unspecified, thoracic region: Secondary | ICD-10-CM | POA: Diagnosis not present

## 2021-08-08 NOTE — Progress Notes (Signed)
NAME: Andrew Mullins  DOB: [birth year]  MRN: [id number]  Date/Time: 08/08/2021 11:22 AM  Subjective:   is here with his wife for follow up of thoracic vertebral infection with streptococcus anginosis Andrew Mullins is a 66 y.o. male with a history of esophageal carcinoma diagnosed in 2013 s/p radiation and chemo complicated by severe esophageal stricture needing monthly esophageal dilatation was in [facility] between 06/28/21-07/12/21 for fever , chills and midback pain . HE was found to be bacteremic with strep anginosis and MRI thoracic spine showed T1-T2 acute osteomyelitis /discitis associated with epidural phlegmon extending C7-T5 He underwent T1-T2 hemilaminectomy and washout of epidural abscess and the pain improved after that. HE was on IV antibiotics the whole time- HE had intermittent fever and a repeat culture on 07/04/21 had prevotella denticola, flagyl was added to the ceftriaxone The pICC had to be changed because of bacteremia HE went hom eon IV ceftriaxone 2 grams Q12 and flagly 536m PO q6 for a total of 6 weeks until 08/12/21 HE is here for follow up and doing much better Has a low level persistent pain in the mid thorax No fever or chills Having trouble taking  flagyl as he has to crush it and it tastes bad After discharge home he already had another esophageal balloon dilatation of stricture from 0-22 mm HE is another one set up for 7/27- His GI physician is bringing a team of surgeons to witness the procedure and decide for surgery  Past Medical History:  Diagnosis Date   Cancer ([facility]    esophageal   Complication of anesthesia    GERD (gastroesophageal reflux disease)    Hypertension    [place]    [place]   no wealness    Past Surgical History:  Procedure Laterality Date   broken bones repair     COLONOSCOPY WITH PROPOFOL N/A 11/11/2020   Procedure: COLONOSCOPY WITH PROPOFOL;  Surgeon: AJonathon Bellows MD;  [facility]  Service:  Gastroenterology;  Laterality: N/A;   ESOPHAGOGASTRODUODENOSCOPY (EGD) WITH PROPOFOL N/A 02/18/2018   Procedure: ESOPHAGOGASTRODUODENOSCOPY (EGD) WITH PROPOFOL;  Surgeon: AJonathon Bellows MD;  [facility];  Service: Gastroenterology;  Laterality: N/A;   ESOPHAGOGASTRODUODENOSCOPY (EGD) WITH PROPOFOL N/A 04/01/2018   Procedure: ESOPHAGOGASTRODUODENOSCOPY (EGD) WITH PROPOFOL;  Surgeon: AJonathon Bellows MD;  [facility];  Service: Gastroenterology;  Laterality: N/A;   ESOPHAGOGASTRODUODENOSCOPY (EGD) WITH PROPOFOL N/A 12/02/2018   Procedure: ESOPHAGOGASTRODUODENOSCOPY (EGD) WITH PROPOFOL with Dilation;  Surgeon: AJonathon Bellows MD;  [facility];  Service: Gastroenterology;  Laterality: N/A;   ESOPHAGOGASTRODUODENOSCOPY (EGD) WITH PROPOFOL N/A 05/19/2019   Procedure: ESOPHAGOGASTRODUODENOSCOPY (EGD) WITH PROPOFOL;  Surgeon: AJonathon Bellows MD;  [facility];  Service: Gastroenterology;  Laterality: N/A;   ESOPHAGOGASTRODUODENOSCOPY (EGD) WITH PROPOFOL N/A 05/18/2019   Procedure: ESOPHAGOGASTRODUODENOSCOPY (EGD) WITH PROPOFOL with Dilation;  Surgeon: AJonathon Bellows MD;  [facility]  Service: Gastroenterology;  Laterality: N/A;   ESOPHAGOGASTRODUODENOSCOPY (EGD) WITH PROPOFOL N/A 06/25/2019   Procedure: ESOPHAGOGASTRODUODENOSCOPY (EGD) WITH PROPOFOL  with Dilation;  Surgeon: AJonathon Bellows MD;  [facility];  Service: Gastroenterology;  Laterality: N/A;   ESOPHAGOGASTRODUODENOSCOPY (EGD) WITH PROPOFOL N/A 07/09/2019   Procedure: ESOPHAGOGASTRODUODENOSCOPY (EGD) WITH PROPOFOL with Dilation;  Surgeon: AJonathon Bellows MD;  [facility];  Service: Gastroenterology;  Laterality: N/A;  Pt requests early morning   ESOPHAGOGASTRODUODENOSCOPY (EGD) WITH PROPOFOL N/A 08/11/2019   Procedure: ESOPHAGOGASTRODUODENOSCOPY (EGD) WITH PROPOFOL with Dilation;  Surgeon: AJonathon Bellows MD;  [facility];  Service: Gastroenterology;  Laterality: N/A;    ESOPHAGOGASTRODUODENOSCOPY (EGD) WITH PROPOFOL N/A 09/11/2019   Procedure: ESOPHAGOGASTRODUODENOSCOPY (EGD) WITH PROPOFOL with Dilation;  Surgeon: [name], MD;  [facility];  Service: Gastroenterology;  Laterality: N/A;   ESOPHAGOGASTRODUODENOSCOPY (EGD) WITH PROPOFOL N/A 10/02/2019   Procedure: ESOPHAGOGASTRODUODENOSCOPY (EGD) WITH PROPOFOL with Dilation;  Surgeon: [name], MD;  Location: [facility];  Service: Gastroenterology;  Laterality: N/A;   ESOPHAGOGASTRODUODENOSCOPY (EGD) WITH PROPOFOL N/A 11/04/2019   Procedure: ESOPHAGOGASTRODUODENOSCOPY (EGD) WITH PROPOFOL;  Surgeon: [name], MD;  Location[facility];  Service: Gastroenterology;  Laterality: N/A;   ESOPHAGOGASTRODUODENOSCOPY (EGD) WITH PROPOFOL N/A 02/05/2020   Procedure: ESOPHAGOGASTRODUODENOSCOPY (EGD) WITH PROPOFOL;  Surgeon: [name], MD;  [facility];  Service: Gastroenterology;  Laterality: N/A;   ESOPHAGOGASTRODUODENOSCOPY (EGD) WITH PROPOFOL N/A 02/19/2020   Procedure: ESOPHAGOGASTRODUODENOSCOPY (EGD) WITH PROPOFOL;  Surgeon: [name], MD;  Location: [facility] ENDOSCOPY;  Service: Gastroenterology;  Laterality: N/A;   ESOPHAGOGASTRODUODENOSCOPY (EGD) WITH PROPOFOL N/A 03/10/2020   Procedure: ESOPHAGOGASTRODUODENOSCOPY (EGD) WITH PROPOFOL;  Surgeon: [name], MD;  Location: [facility] END[facility]COPY;  Service: Gastroenterology;  Laterality: N/A;   ESOPHAGOGASTRODUODENOSCOPY (EGD) WITH PROPOFOL N/A 03/31/2020   Procedure: ESOPHAGOGASTRODUODENOSCOPY (EGD) WITH PROPOFOL;  Surgeon: [name], MD;  Location: [facility];  Service: Gastroenterology;  Laterality: N/A;  7:30 AM PROCEDURE PER DR ANNA C-19 TEST ON 03/30/2020 AM   ESOPHAGOGASTRODUODENOSCOPY (EGD) WITH PROPOFOL N/A 04/22/2020   Procedure: ESOPHAGOGASTRODUODENOSCOPY (EGD) WITH PROPOFOL;  Surgeon: [name], MD;  Location: [facility]COPY;  Service: Gastroenterology;  Laterality: N/A;   ESOPHAGOGASTRODUODENOSCOPY (EGD) WITH  PROPOFOL N/A 05/26/2020   Procedure: ESOPHAGOGASTRODUODENOSCOPY (EGD) WITH PROPOFOL;  Surgeon: [name], MD;  Location: [facility]COPY;  Service: Gastroenterology;  Laterality: N/A;   EYE SURGERY     retinal detatchment   radical prostate     THORACIC LAMINECTOMY FOR EPIDURAL ABSCESS Bilateral 07/02/2021   Procedure: THORACIC LAMINECTOMY FOR EPIDURAL ABSCESS;  Surgeon: Meade Maw, MD;  Location: [facility] ORS;  Service: Neurosurgery;  Laterality: Bilateral;   TONSILLECTOMY      Social History   Socioeconomic History   Marital status: Married    Spouse name: Not on file   Number of children: Not on file   Years of education: Not on file   Highest education level: Not on file  Occupational History   Not on file  Tobacco Use   Smoking status: Former   Smokeless tobacco: Never  Vaping Use   Vaping Use: Never used  Substance and Sexual Activity   Alcohol use: Not Currently   Drug use: Never   Sexual activity: Not on file  Other Topics Concern   Not on file  Social History Narrative   Lives at home with wife   Social Determinants of Health   Financial Resource Strain: Not on file  Food Insecurity: Not on file  Transportation Needs: Not on file  Physical Activity: Not on file  Stress: Not on file  Social Connections: Not on file  Intimate Partner Violence: Not on file    No family history on file. Allergies  Allergen Reactions   Hydralazine Other (See Comments)    Flushing, anxiety-per [facility] Flushing, anxiety, shaking after procedure.   I? Current Outpatient Medications  Medication Sig Dispense Refill   amLODipine (NORVASC) 5 MG tablet Take 5 mg by mouth daily.     aspirin EC 81 MG tablet Take 81 mg by mouth daily.     atorvastatin (LIPITOR) 40 MG tablet Take 1 tablet (40 mg total) by mouth daily at 6  PM. 30 tablet 2   cefTRIAXone (ROCEPHIN) IVPB Inject 2 g into the vein every 12 (twelve) hours. Indication:  Streptococcus Anginosus bacteremia with T1-T2 discitis  spinal epidural phlegmon/abscess First Dose: Yes Last Day of Therapy:  08/12/2021 Labs (weekly): CBC/diff, CMP, ESR, CRP Please pull PIC at completion of IV antibiotics Fax weekly labs to [phone] Method of administration: IV Push Method of administration may be changed at the discretion of home infusion pharmacist based upon assessment of the patient and/or caregiver's ability to self-administer the medication ordered. 62 Units 0   CELEBREX 200 MG capsule Take 200 mg by mouth 2 (two) times daily.     cyclobenzaprine (FLEXERIL) 10 MG tablet Take 1 tablet (10 mg total) by mouth 3 (three) times daily as needed for muscle spasms. 20 tablet 0   famciclovir (FAMVIR) 250 MG tablet Take 250 mg by mouth 2 (two) times daily.     fentaNYL (DURAGESIC) 25 MCG/HR 1 patch every 3 (three) days.     gabapentin (NEURONTIN) 100 MG capsule Take 200 mg by mouth 2 (two) times daily.     ibuprofen (ADVIL) 200 MG tablet Take 400-600 mg by mouth at bedtime as needed for moderate pain.     losartan (COZAAR) 100 MG tablet Take 1 tablet (100 mg total) by mouth daily. 30 tablet 0   metoprolol tartrate (LOPRESSOR) 25 MG tablet Take 25 mg by mouth 2 (two) times daily.     metroNIDAZOLE (FLAGYL) 500 MG tablet Take 1 tablet (500 mg total) by mouth every 6 (six) hours. 124 tablet 0   morphine 10 MG/5ML solution Take 5 mg by mouth every 4 (four) hours as needed.     Morphine Sulfate (MORPHINE CONCENTRATE) 10 mg / 0.5 ml concentrated solution SMARTSIG:By Mouth     Multiple Vitamins-Minerals (MULTIVITAMIN ADULT) CHEW Chew 2 tablets by mouth every morning.     NARCAN 4 MG/0.1ML LIQD nasal spray kit SMARTSIG:1 Spray(s) Both Nares Once PRN     omeprazole (PRILOSEC) 20 MG capsule Take 20 mg by mouth daily.     polyethylene glycol (MIRALAX / GLYCOLAX) 17 g packet Take 17 g by mouth daily as needed for moderate constipation. 14 each 0   No current facility-administered medications for this visit.     Abtx:  Anti-infectives  (From admission, onward)    None       REVIEW OF SYSTEMS:  Const: negative fever, negative chills, negative weight loss Eyes: negative diplopia or visual changes, negative eye pain ENT: negative coryza, negative sore throat Resp: negative cough, hemoptysis, dyspnea Cards: negative for chest pain, palpitations, lower extremity edema GU: negative for frequency, dysuria and hematuria GI: difficulty swallowing Skin: negative for rash and pruritus Heme: negative for easy bruising and gum/nose bleeding MS: mid back pain Neurolo:negative for headaches, dizziness, vertigo, memory problems  Psych: negative for feelings of anxiety, depression  Endocrine: negative for thyroid, diabetes Allergy/Immunology- negative for any medication or food allergies ? Objective:  VITALS:  BP 123/80   Pulse 90   Temp (!) 97 F (36.1 C) (Temporal)   Ht 6' (1.829 m)   Wt 193 lb (87.5 kg)   BMI 26.18 kg/m  LDA Rt PICC line site fine PHYSICAL EXAM:  General: Alert, cooperative, no distress, appears stated age.  Head: Normocephalic, without obvious abnormality, atraumatic. Eyes: Conjunctivae clear, anicteric sclerae. Pupils are equal ENT Nares normal. No drainage or sinus tenderness. Lips, mucosa, and tongue normal. No Thrush Neck: Supple, symmetrical, no adenopathy, thyroid: non tender  no carotid bruit and no JVD. Back: mid thoracic spine surgical scar healed well Lungs: Clear to auscultation bilaterally. No Wheezing or Rhonchi. No rales. Heart: Regular rate and rhythm, no murmur, rub or gallop. Abdomen: Soft, non-tender,not distended. Bowel sounds normal. No masses Extremities: atraumatic, no cyanosis. No edema. No clubbing Skin: No rashes or lesions. Or bruising Lymph: Cervical, supraclavicular normal. Neurologic: Grossly non-focal Pertinent Labs ESR 73  ? 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 ? ? ___________________________________________________ Discussed with patient, requesting provider Note:  This document was prepared using Dragon voice recognition software and may include unintentional dictation errors.

## 2021-08-08 NOTE — Patient Instructions (Addendum)
You are here for follow up of the thoracic vertebral infection with streptococcus anginosus bacteremia and prevotella- you are on ceftriaxone and flagyl- you can reduce flagyl to q 12. Will continue iv  antibiotics ceftriaxone and flagyl until 08/26/21. Wil follow the sed rate and crp  After Iv would do Po augmentin-  Will do a telephone visit on 08/24/21

## 2021-08-09 ENCOUNTER — Telehealth: Payer: Self-pay | Admitting: Infectious Diseases

## 2021-08-09 NOTE — Telephone Encounter (Signed)
Returned call to wife- c/o nasuea and vomiting due to Po flagyl which is being crushed-as he cannot swallow pills - due to esophageal stricture - will change to IV flagyl '500mg'$  Q 12 until 08/26/21 Will inform [facility]

## 2021-08-10 ENCOUNTER — Encounter: Payer: Medicare Other | Admitting: Neurosurgery

## 2021-08-16 ENCOUNTER — Telehealth: Payer: Self-pay

## 2021-08-16 NOTE — Telephone Encounter (Signed)
Received call from [place] with [facility], she wanted to update the office that this critical result was from 08/07/21, but that [place] did not call them to notify them about the result until last night (08/15/21). Asked them to please repeat potassium.   Left message with August Saucer at [facility] to please have Amy, pharmacist, call our office back to see when they received critical value.   Beryle Flock, RN

## 2021-08-16 NOTE — Telephone Encounter (Signed)
Received call from [place] with [facility] to report that patient's potassium resulted at 6.8. Asked her to please have nursing recollect.   Spoke with patient and advised him that potassium came back elevated. He reports that he feels fine and denies symptoms such as heart palpations, numbness or tingling, muscle weakness, and nausea/vomiting. Asked him to please present to the emergency department if these symptoms develop. Notified him that home health we be coming back to recollect sample for retesting.    Patient verbalized understanding and has no further questions.   Beryle Flock, RN

## 2021-08-17 ENCOUNTER — Other Ambulatory Visit: Payer: Self-pay

## 2021-08-17 ENCOUNTER — Other Ambulatory Visit
Admission: RE | Admit: 2021-08-17 | Discharge: 2021-08-17 | Disposition: A | Discharge status: Home / Self Care | Payer: Medicare Other | Attending: Infectious Diseases | Admitting: Infectious Diseases

## 2021-08-17 DIAGNOSIS — M4624 Osteomyelitis of vertebra, thoracic region: Secondary | ICD-10-CM | POA: Diagnosis present

## 2021-08-17 LAB — COMPREHENSIVE METABOLIC PANEL
ALT: 13 U/L (ref 0–44)
AST: 22 U/L (ref 15–41)
Albumin: 3.4 g/dL — ABNORMAL LOW (ref 3.5–5.0)
Alkaline Phosphatase: 62 U/L (ref 38–126)
Anion gap: 8 (ref 5–15)
BUN: 17 mg/dL (ref 8–23)
CO2: 29 mmol/L (ref 22–32)
Calcium: 8.9 mg/dL (ref 8.9–10.3)
Chloride: 99 mmol/L (ref 98–111)
Creatinine, Ser: 0.76 mg/dL (ref 0.61–1.24)
GFR, Estimated: >60 mL/min (ref >60)
Glucose, Bld: 134 mg/dL — ABNORMAL HIGH (ref 70–99)
Potassium: 2.8 mmol/L — ABNORMAL LOW (ref 3.5–5.1)
Sodium: 136 mmol/L (ref 135–145)
Total Bilirubin: 0.4 mg/dL (ref 0.3–1.2)
Total Protein: 7.5 g/dL (ref 6.5–8.1)

## 2021-08-17 LAB — CBC WITH DIFFERENTIAL/PLATELET
Abs Immature Granulocytes: 0.01 10*3/uL (ref 0.00–0.07)
Basophils Absolute: 0 10*3/uL (ref 0.0–0.1)
Basophils Relative: 1 %
Eosinophils Absolute: 0.1 10*3/uL (ref 0.0–0.5)
Eosinophils Relative: 2 %
HCT: 38.6 % — ABNORMAL LOW (ref 39.0–52.0)
Hemoglobin: 12.6 g/dL — ABNORMAL LOW (ref 13.0–17.0)
Immature Granulocytes: 0 %
Lymphocytes Relative: 21 %
Lymphs Abs: 1.3 10*3/uL (ref 0.7–4.0)
MCH: 27 pg (ref 26.0–34.0)
MCHC: 32.6 g/dL (ref 30.0–36.0)
MCV: 82.8 fL (ref 80.0–100.0)
Monocytes Absolute: 0.9 10*3/uL (ref 0.1–1.0)
Monocytes Relative: 15 %
Neutro Abs: 3.8 10*3/uL (ref 1.7–7.7)
Neutrophils Relative %: 61 %
Platelets: 201 10*3/uL (ref 150–400)
RBC: 4.66 MIL/uL (ref 4.22–5.81)
RDW: 14.6 % (ref 11.5–15.5)
WBC: 6.1 10*3/uL (ref 4.0–10.5)
nRBC: 0 % (ref 0.0–0.2)

## 2021-08-17 LAB — SEDIMENTATION RATE: Sed Rate: 38 mm/hr — ABNORMAL HIGH (ref 0–20)

## 2021-08-17 LAB — C-REACTIVE PROTEIN: CRP: 0.8 mg/dL (ref <1.0)

## 2021-08-17 NOTE — Telephone Encounter (Signed)
Thanks Diminique - that is definitely frustrating.

## 2021-08-17 NOTE — Telephone Encounter (Signed)
Tiffany with [facility] called this morning stating that she spoke to [place] and they are not running the patient's blood specimens in an appropriate amount of time, so she feels this is the reasoning for the patient potassium level being abnormal. Tiffany stated specimen that was drawn on 08/14/21 for patient's K+ level will more than likely be abnormal, she stated after speaking with [facility] last night they had still not ran the specimen.   I have spoke to the patient and asked that he come to the lab at [facility] to have his potassium level repeated here and they will do a peripheral stick. Patient stated he will come this afternoon to have labs drawn. CMP has been ordered for the patient.

## 2021-08-18 ENCOUNTER — Telehealth: Payer: Self-pay

## 2021-08-18 NOTE — Telephone Encounter (Signed)
Called patient to relay Dr. Gwenevere Ghazi message, his wife answered and states Andrew Mullins is sleeping. Asked her to please have him call Dr. Gwenevere Ghazi office back when he wakes up.   Beryle Flock, RN

## 2021-08-18 NOTE — Telephone Encounter (Signed)
-----   Message from Tsosie Billing, MD sent at 08/17/2021 11:31 PM EDT ----- Please let him that his potassium is low at 2.8. He can check with Dr.Johnston regarding  KCL supplement Thx ----- Message ----- From: Interface, Lab In [place] Sent: 08/17/2021   1:13 PM EDT To: Tsosie Billing, MD

## 2021-08-18 NOTE — Telephone Encounter (Signed)
Patient returned call, advised that his potassium was low and recommended he follow up with PCP to discuss supplementation. He is asking about eating a banana every day, advised that he discuss with PCP. Patient verbalized understanding and has no further questions.   Beryle Flock, RN

## 2021-08-23 ENCOUNTER — Telehealth: Payer: Self-pay

## 2021-08-23 NOTE — Telephone Encounter (Signed)
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 Patient scheduled for appointment tomorrow.    [place], CMA

## 2021-08-24 ENCOUNTER — Encounter: Payer: Self-pay | Admitting: Neurosurgery

## 2021-08-24 ENCOUNTER — Ambulatory Visit: Payer: Medicare Other | Admitting: Neurosurgery

## 2021-08-24 ENCOUNTER — Ambulatory Visit: Payer: Medicare Other | Attending: Infectious Diseases | Admitting: Infectious Diseases

## 2021-08-24 VITALS — BP 130/82 | Ht 72.0 in | Wt 193.0 lb

## 2021-08-24 DIAGNOSIS — M4620 Osteomyelitis of vertebra, site unspecified: Secondary | ICD-10-CM | POA: Insufficient documentation

## 2021-08-24 DIAGNOSIS — M4624 Osteomyelitis of vertebra, thoracic region: Secondary | ICD-10-CM

## 2021-08-24 DIAGNOSIS — G062 Extradural and subdural abscess, unspecified: Secondary | ICD-10-CM | POA: Diagnosis not present

## 2021-08-24 DIAGNOSIS — B999 Unspecified infectious disease: Secondary | ICD-10-CM | POA: Diagnosis not present

## 2021-08-24 MED ORDER — AMOXICILLIN-POT CLAVULANATE 875-125 MG PO TABS
1.0000 | ORAL_TABLET | Freq: Two times a day (BID) | ORAL | 0 refills | Status: DC
Start: 2021-08-24 — End: 2021-09-29

## 2021-08-24 NOTE — Progress Notes (Signed)
The purpose of this virtual visit is to provide medical care while limiting exposure to the novel coronavirus (COVID19) for both patient and office staff.   Consent was obtained for phone visit:  Yes.   Answered questions that patient had about telehealth interaction:  Yes.   I discussed the limitations, risks, security and privacy concerns of performing an evaluation and management service by telephone. I also discussed with the patient that there may be a patient responsible charge related to this service. The patient expressed understanding and agreed to proceed.   Patient Location: Home Provider Location: office Pt , his wife lucinda, and provider on the call This is a follow up visit for treatment of thoracic vertebral T1-T2osteomyelitis, needing rt hemilaminectomy -- infection with streptococcus anginosus and also had prevotella in blood culture a few days later He has been on IV ceftriaxone and PO flagyl $RemoveB'500mg'MRGTwwHE$  Q 12 for 8 weeks now.  He had some tingling in his arms and feet and was concerned about neuropathy and discontinued flagyl yesterday- previously he had trouble taking flagyl due to bitter taste He never got IV flagyl at home  He has a history of esophageal carcinoma diagnosed in 2013 s/p radiation and chemo complicated by severe esophageal stricture needing monthly esophageal dilatation.  He has had around 90 dilatations in the past . Infact it was one of the dilatation done recently which was thought to be the cause of the vertebral infection.  on 08/08/21 he is going to have Esophageal surgery at [place] and Dr.Meyers  HE is overall feeling better- the need for morphine has reduced  He is requesting to complete IV ceftriaxone sooner than 8/12- he can stop today and will get the VNS to remove PICC tomorrow or Monday  Labs from 08/21/21 ESR 39 ( 65) CRP 3  K-3 - He will discuss with his PCP regarding this  He will go on PO augmentin for 2 weeks.  will check whether  Dr.Fitzgerald can follow him for medicine and ID  Discussed the management with patient and his wife Total time spent 19 minutes

## 2021-08-24 NOTE — Progress Notes (Signed)
   DOS: 07/02/21 (T1-2 laminotomy for epidural abscess)  HISTORY OF PRESENT ILLNESS: 08/24/2021 [name] is status post laminotomy for epidural abscess.  He is now off antibiotics and is starting to see some improvement in his physical condition from this.  He has had worsening of his esophageal strictures, and will undergo surgery for this on August 25th at [facility].   PHYSICAL EXAMINATION:   Vitals:   08/24/21 1559  BP: 130/82   General: Patient is well developed, well nourished, calm, collected, and in no apparent distress.  NEUROLOGICAL:  General: In no acute distress.  Awake, alert, oriented to person, place, and time. Pupils equal round and reactive to light.   Strength:  Side Iliopsoas Quads Hamstring PF DF EHL  R '5 5 5 5 5 5  '$ L '5 5 5 5 5 5   '$ Incision c/d/i   ROS (Neurologic): Negative except as noted above  IMAGING: No interval imaging to review   ASSESSMENT/PLAN:  Andrew Mullins is doing well after discitis and thoracic laminotomy for irrigation and evacuation of epidural abscess.  - He will review his pain management plan with palliative care and the thoracic surgery team at [facility].  I will see him back on as needed basis.  I spent a total of 15 minutes in face-to-face and non-face-to-face activities related to this patient's care today.   Meade Maw MD, [facility] Department of Neurosurgery

## 2021-08-30 ENCOUNTER — Telehealth: Payer: Medicare Other | Admitting: Student

## 2021-08-30 DIAGNOSIS — Z515 Encounter for palliative care: Secondary | ICD-10-CM

## 2021-08-30 DIAGNOSIS — R110 Nausea: Secondary | ICD-10-CM

## 2021-08-30 DIAGNOSIS — M869 Osteomyelitis, unspecified: Secondary | ICD-10-CM

## 2021-08-30 DIAGNOSIS — M546 Pain in thoracic spine: Secondary | ICD-10-CM

## 2021-08-30 DIAGNOSIS — K5900 Constipation, unspecified: Secondary | ICD-10-CM

## 2021-08-30 DIAGNOSIS — R1310 Dysphagia, unspecified: Secondary | ICD-10-CM

## 2021-08-30 NOTE — Progress Notes (Signed)
[facility] Consult Note Telephone: [phone]  Fax: [phone]    Date of encounter: 08/30/21 9:38 AM PATIENT NAME: Andrew Mullins [place] [address]   [phone] (home)  DOB: 10-24-1955 MRN: [id number] PRIMARY CARE PROVIDER:    Baxter Hire, MD,  [place] [address] [phone]  REFERRING PROVIDER:   Baxter Hire, MD [place],  [place] [address] [phone]  RESPONSIBLE PARTY:    Contact Information     Name Relation Home Work Mobile   Amrein,lucinda Spouse [phone]  [phone]        Due to the COVID-19 crisis, this visit was done via telemedicine from my office and it was initiated and consent by this patient and or family.  I connected with  Andrew Mullins OR PROXY on 08/30/21 by a video enabled telemedicine application and verified that I am speaking with the correct person using two identifiers.   I discussed the limitations of evaluation and management by telemedicine. The patient expressed understanding and agreed to proceed.                                   ASSESSMENT AND PLAN / RECOMMENDATIONS:   Advance Care Planning/Goals of Care: Goals include to maximize quality of life and symptom management. Patient/health care surrogate gave his/her permission to discuss.  CODE STATUS: Full Code  Symptom Management/Plan:  Pain-secondary to vertebral osteomyelitis, discitis- pain is improving; receiving fentanyl patch 12 and 25 mcg to equal 37 mcg every 3 days, morphine as needed breakthrough pain. Patient has only required morphine twice in the last week for breakthrough pain. Discussed titrating back down to 25 mcg every 3 days and morphine as needed breakthrough pain; patient in agreement.  Vertebral osteomyelitis-patient has completed flagyl and IV ceftriaxone. He is currently taking PO Augmentin x 2 weeks. Patient to follow  up with ID as scheduled.  Dysphagia-due to his esophageal stricture; he has had around 90 dilations. Hx of esophageal cancer. S/p dilation yesterday. He is tolerating soft foods. He is scheduled to have esophageal surgery on 09/08/21.   Nausea-continue ondansetron 4 mg every 8 hours PRN.  Constipation-restart miralax 17 gm daily.   Follow up Palliative Care Visit: Palliative care will continue to follow for complex medical decision making, advance care planning, and clarification of goals. Return in 6 weeks or prn.  This visit was coded based on medical decision making (MDM).   HOSPICE ELIGIBILITY/DIAGNOSIS: TBD  Chief Complaint: Palliative medicine follow-up visit.  HISTORY OF PRESENT ILLNESS:  Andrew Mullins is a 66 y.o. 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    Patient endorses improvement in pain. Pain is down to a 2-3/10.  He is only required morphine twice in the past week for breakthrough pain. He has completed IV ceftriaxone and p.o. Flagyl regimen.  He is currently on Augmentin x 2 weeks.  He does endorse nausea; although improved. Occasional constipation. He is status post dilation yesterday, up to 20 mm.   History obtained from review of EMR, discussion with primary team, and interview with family, facility staff/caregiver and/or Mr. Geffre.  I reviewed available labs, medications, imaging, studies and related documents from the EMR.  Records  reviewed and summarized above.   ROS  10 point review of systems is negative, except for pertinent positives and negatives detailed per the HPI.   Physical Exam: Constitutional: NAD General: frail appearing EYES: anicteric sclera, lids intact, no discharge  ENMT: intact hearing, dentition intact Pulmonary: no  increased work of breathing, no cough, room air GU: deferred MSK: moves all extremities, ambulatory Skin:  no rashes or wounds on visible skin Neuro: no generalized weakness,  no cognitive impairment Psych: non-anxious affect, A and O x 3 Hem/lymph/immuno: no widespread bruising   Thank you for the opportunity to participate in the care of [name].  The palliative care team will continue to follow. Please call our office at [phone] if we can be of additional assistance.   Ezekiel Slocumb, NP   COVID-19 PATIENT SCREENING TOOL Asked and negative response unless otherwise noted:   Have you had symptoms of covid, tested positive or been in contact with someone with symptoms/positive test in the past 5-10 days? No

## 2021-09-08 HISTORY — PX: ESOPHAGECTOMY: SUR457

## 2021-09-25 ENCOUNTER — Telehealth: Payer: Self-pay | Admitting: Internal Medicine

## 2021-09-25 NOTE — Telephone Encounter (Signed)
Pt wife called stating Dr Vicente Males talked to the provider about taking the pt on and he agreed

## 2021-09-26 NOTE — Telephone Encounter (Signed)
Dr. Caryl Bis did agree to see this patient as a new patient, please call and schedule.  Thanks Hitesh Fouche,cma

## 2021-09-26 NOTE — Telephone Encounter (Signed)
I did. It is ok for him to be scheduled with me.

## 2021-09-29 ENCOUNTER — Ambulatory Visit (INDEPENDENT_AMBULATORY_CARE_PROVIDER_SITE_OTHER): Payer: Medicare Other | Admitting: Family Medicine

## 2021-09-29 ENCOUNTER — Encounter: Payer: Self-pay | Admitting: Family Medicine

## 2021-09-29 VITALS — BP 130/79 | HR 89 | Temp 97.9°F | Ht 72.0 in | Wt 182.6 lb

## 2021-09-29 DIAGNOSIS — I10 Essential (primary) hypertension: Secondary | ICD-10-CM | POA: Diagnosis not present

## 2021-09-29 DIAGNOSIS — M546 Pain in thoracic spine: Secondary | ICD-10-CM

## 2021-09-29 DIAGNOSIS — G479 Sleep disorder, unspecified: Secondary | ICD-10-CM | POA: Insufficient documentation

## 2021-09-29 DIAGNOSIS — R7989 Other specified abnormal findings of blood chemistry: Secondary | ICD-10-CM | POA: Insufficient documentation

## 2021-09-29 DIAGNOSIS — R7309 Other abnormal glucose: Secondary | ICD-10-CM | POA: Diagnosis not present

## 2021-09-29 DIAGNOSIS — Z9889 Other specified postprocedural states: Secondary | ICD-10-CM | POA: Insufficient documentation

## 2021-09-29 DIAGNOSIS — D649 Anemia, unspecified: Secondary | ICD-10-CM | POA: Diagnosis not present

## 2021-09-29 DIAGNOSIS — Z9049 Acquired absence of other specified parts of digestive tract: Secondary | ICD-10-CM | POA: Insufficient documentation

## 2021-09-29 DIAGNOSIS — I4891 Unspecified atrial fibrillation: Secondary | ICD-10-CM | POA: Insufficient documentation

## 2021-09-29 MED ORDER — AMLODIPINE BESYLATE 10 MG PO TABS: 10.0000 mg | ORAL_TABLET | Freq: Every day | ORAL | 3 refills | Status: DC

## 2021-09-29 NOTE — Assessment & Plan Note (Signed)
Above goal.  Discussed a goal of less than 130/80.  We will increase amlodipine to 10 mg daily.  He will continue metoprolol 25 mg twice daily and losartan 100 mg daily.

## 2021-09-29 NOTE — Assessment & Plan Note (Signed)
Single episode after surgery.  Status post cardioversion.  Cardiology recommended amiodarone 200 mg daily for 3 months.  He will continue Eliquis 5 mg twice daily.  Refer to cardiology locally.

## 2021-09-29 NOTE — Assessment & Plan Note (Signed)
Recheck today. 

## 2021-09-29 NOTE — Assessment & Plan Note (Signed)
Patient has had more recent sleep issues after his surgery.  Discussed this may improve with time though he does not seem to be sleeping much at all.  I advised against any over-the-counter supplements other than melatonin for sleep.  We discussed trazodone though he does have a mildly prolonged QTc on his most recent EKG.  Advised I would need to discuss with the clinical pharmacist to determine the potential risk of the trazodone given this finding.  We will let the patient know once I hear back from the clinical pharmacist.

## 2021-09-29 NOTE — Assessment & Plan Note (Signed)
Check A1c. 

## 2021-09-29 NOTE — Assessment & Plan Note (Signed)
Patient has recovered quite well.  He has completed treatment for this.

## 2021-09-29 NOTE — Assessment & Plan Note (Signed)
Patient will continue to follow with his thoracic surgeon.  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

## 2021-09-29 NOTE — Patient Instructions (Signed)
Nice to meet you. Cardiology should contact you to schedule an evaluation. You are supposed to be on amiodarone for 3 months per the cardiologist in the hospital. We will increase your amlodipine to 10 mg once daily.  She will continue her other blood pressure medicines.  I will see you back in 1 month. I need to check with the pharmacist regarding the safety of using trazodone and you versus using a different medication to help you sleep.  We will contact you when I hear back from the pharmacist.

## 2021-09-29 NOTE — Progress Notes (Signed)
Andrew Rumps, MD Phone: [phone]  Andrew Mullins is a 67 y.o. male who presents today for new patient visit.  Hypertension: Blood pressure has been between the 120s-130s/70s-80s.  He was started on amlodipine 5 mg daily back in July.  He has continued on losartan and metoprolol.  No chest pain, shortness of breath, or edema.  History of esophageal cancer/discitis: Patient had esophageal cancer a number of years ago.  He was treated with maximal dose radiation and chemotherapy.  This resulted in significant stricture in his esophagus.  He had to go through numerous dilations of his esophagus on a fairly frequent basis.  He ended up with discitis that was felt to relate to a microperforation during one of his esophageal dilations.  He was hospitalized for the discitis and underwent thoracic laminectomy and prolonged antibiotic course.  He notes he has recovered well from that.  He has more recently undergone esophagectomy given the significant stricture.  He now has a feeding tube.  He lost a large amount of weight throughout this process.  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  He has been on Tylenol 1000 mg 3 times daily and gabapentin 3 times daily since his surgery.  He notes his pain is generally a 2-3 out of 10.  He has had no pain related to the discitis surgery.  He has been swallowing and eating soft foods.  Sleeping difficulty: Patient notes since his esophagus surgery he has had trouble sleeping.  He will get a few hours of sleep throughout the night.  Sometimes he has trouble falling asleep.  Melatonin has not been terribly beneficial.  He notes no excessive pain that is interfering with his sleep.  No depression.  He does not think he is anxious either though he does have trouble shutting his mind down at night.  He does not drink alcohol.  He has 1 cup of tea in the morning.  He does watch TV the hour before  bed.  He tries to go to bed at 10 PM and typically is awake by 4 to 5 AM.  He does note they ordered an over-the-counter supplement to help with sleep though he has not started on this yet.  Atrial fibrillation: Patient went into A-fib with RVR while in the hospital for his esophagus surgery.  He was seen by cardiology there and they recommended amiodarone for 3 months and starting on Eliquis.  He was cardioverted while in the hospital.  He has not scheduled with cardiology.  He has not felt as though he has gone into A-fib since coming out of the hospital.  Patient was noted to be anemic and have elevated glucose and LFTs during his most recent hospitalization.  Social History   Tobacco Use  Smoking Status Former  Smokeless Tobacco Never    Current Outpatient Medications on File Prior to Visit  Medication Sig Dispense Refill   Acetaminophen 650 MG/20.3ML SOLN 31.2 mL (1,000 mg total) by Enteral tube: gastric  route Three (3) times a day for 15 days.     apixaban (ELIQUIS) 5 MG TABS tablet Take 5 mg by mouth 2 (two) times daily.     atorvastatin (LIPITOR) 40 MG tablet Take 1 tablet (40 mg total) by mouth daily at 6 PM. 30 tablet 2   famciclovir (FAMVIR) 250 MG tablet Take 250 mg by mouth 2 (two) times daily.     metoprolol  tartrate (LOPRESSOR) 25 MG tablet Take 25 mg by mouth 2 (two) times daily.     Multiple Vitamins-Minerals (MULTIVITAMIN ADULT) CHEW Chew 2 tablets by mouth every morning.     omeprazole (PRILOSEC) 20 MG capsule Take 20 mg by mouth daily.     polyethylene glycol (MIRALAX / GLYCOLAX) 17 g packet Take 17 g by mouth daily as needed for moderate constipation. 14 each 0   amiodarone (PACERONE) 200 MG tablet Take 200 mg by mouth daily.     losartan (COZAAR) 100 MG tablet Take 1 tablet (100 mg total) by mouth daily. 30 tablet 0   No current facility-administered medications on file prior to visit.     ROS see history of present illness  Objective  Physical Exam Vitals:    09/29/21 1350  BP: 130/79  Pulse: 89  Temp: 97.9 F (36.6 C)  SpO2: 97%    BP Readings from Last 3 Encounters:  09/29/21 130/79  08/24/21 130/82  08/08/21 123/80   Wt Readings from Last 3 Encounters:  09/29/21 182 lb 9.6 oz (82.8 kg)  08/24/21 193 lb (87.5 kg)  08/08/21 193 lb (87.5 kg)    Physical Exam Constitutional:      General: He is not in acute distress.    Appearance: He is not diaphoretic.  Cardiovascular:     Rate and Rhythm: Normal rate and regular rhythm.     Heart sounds: Normal heart sounds.  Pulmonary:     Effort: Pulmonary effort is normal.     Breath sounds: Normal breath sounds.  Abdominal:     Comments: Feeding tube in place in the left side of his abdomen, no signs of surrounding infection  Skin:    General: Skin is warm and dry.  Neurological:     Mental Status: He is alert.     Right flank    Right flank    Left upper chest/neck    Midline abdomen  Assessment/Plan: Please see individual problem list.  Problem List Items Addressed This Visit     Essential hypertension - Primary (Chronic)    Above goal.  Discussed a goal of less than 130/80.  We will increase amlodipine to 10 mg daily.  He will continue metoprolol 25 mg twice daily and losartan 100 mg daily.      Relevant Medications   amiodarone (PACERONE) 200 MG tablet   apixaban (ELIQUIS) 5 MG TABS tablet   amLODipine (NORVASC) 10 MG tablet   Anemia    Recheck today.      Relevant Orders   CBC   Atrial fibrillation (HCC)    Single episode after surgery.  Status post cardioversion.  Cardiology recommended amiodarone 200 mg daily for 3 months.  He will continue Eliquis 5 mg twice daily.  Refer to cardiology locally.      Relevant Medications   amiodarone (PACERONE) 200 MG tablet   apixaban (ELIQUIS) 5 MG TABS tablet   amLODipine (NORVASC) 10 MG tablet   Other Relevant Orders   Ambulatory referral to Cardiology   Discitis/osteomyelitis    Patient has recovered  quite well.  He has completed treatment for this.      Relevant Medications   Acetaminophen 650 MG/20.3ML SOLN   Elevated glucose    Check A1c.      Relevant Orders   HgB A1c   H/O esophagectomy    Patient will continue to follow with his thoracic surgeon.  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  LFTs abnormal    Recheck today.      Relevant Orders   Comp Met (CMET)   Sleeping difficulty    Patient has had more recent sleep issues after his surgery.  Discussed this may improve with time though he does not seem to be sleeping much at all.  I advised against any over-the-counter supplements other than melatonin for sleep.  We discussed trazodone though he does have a mildly prolonged QTc on his most recent EKG.  Advised I would need to discuss with the clinical pharmacist to determine the potential risk of the trazodone given this finding.  We will let the patient know once I hear back from the clinical pharmacist.       Return in about 1 month (around 10/29/2021) for Hypertension.  I have spent 62 minutes in the care of this patient regarding chart review, history taking, documentation, completion of exam, placing orders, discussion of plan, communication with pharmacist.   Andrew Rumps, MD [place] [place]

## 2021-09-30 LAB — COMPREHENSIVE METABOLIC PANEL
AG Ratio: 1 (calc) (ref 1.0–2.5)
ALT: 30 U/L (ref 9–46)
AST: 18 U/L (ref 10–35)
Albumin: 3.4 g/dL — ABNORMAL LOW (ref 3.6–5.1)
Alkaline phosphatase (APISO): 86 U/L (ref 35–144)
BUN/Creatinine Ratio: 32 (calc) — ABNORMAL HIGH (ref 6–22)
BUN: 20 mg/dL (ref 7–25)
CO2: 27 mmol/L (ref 20–32)
Calcium: 9 mg/dL (ref 8.6–10.3)
Chloride: 100 mmol/L (ref 98–110)
Creat: 0.62 mg/dL — ABNORMAL LOW (ref 0.70–1.35)
Globulin: 3.5 g/dL (calc) (ref 1.9–3.7)
Glucose, Bld: 100 mg/dL — ABNORMAL HIGH (ref 65–99)
Potassium: 4.3 mmol/L (ref 3.5–5.3)
Sodium: 137 mmol/L (ref 135–146)
Total Bilirubin: 0.3 mg/dL (ref 0.2–1.2)
Total Protein: 6.9 g/dL (ref 6.1–8.1)

## 2021-09-30 LAB — HEMOGLOBIN A1C
Hgb A1c MFr Bld: 5.5 % of total Hgb (ref <5.7)
Mean Plasma Glucose: 111 mg/dL
eAG (mmol/L): 6.2 mmol/L

## 2021-09-30 LAB — CBC
HCT: 31.5 % — ABNORMAL LOW (ref 38.5–50.0)
Hemoglobin: 10.5 g/dL — ABNORMAL LOW (ref 13.2–17.1)
MCH: 28.5 pg (ref 27.0–33.0)
MCHC: 33.3 g/dL (ref 32.0–36.0)
MCV: 85.4 fL (ref 80.0–100.0)
MPV: 9.4 fL (ref 7.5–12.5)
Platelets: 400 10*3/uL (ref 140–400)
RBC: 3.69 10*6/uL — ABNORMAL LOW (ref 4.20–5.80)
RDW: 14.4 % (ref 11.0–15.0)
WBC: 8.8 10*3/uL (ref 3.8–10.8)

## 2021-10-03 ENCOUNTER — Telehealth: Payer: Self-pay | Admitting: Family Medicine

## 2021-10-03 NOTE — Telephone Encounter (Signed)
Please let the patient know that I heard back from the pharmacist regarding the trazodone and his prolonged QTc.  She recommended bringing him back in to do another EKG to see what his QTc might be at this time and if it has improved we could consider a low-dose of the trazodone to help with his sleep.  Can you see if he be willing to come back in for a visit to have an EKG completed and to see me after the EKG is done?  If absolutely needed this could be a 15-minute visit.

## 2021-10-03 NOTE — Telephone Encounter (Signed)
Patient states he is returning our call.  I read Dr. Georges Mouse message to patient and scheduled patient for an EKG and follow-up with Dr. Caryl Bis on 10/06/2021.

## 2021-10-03 NOTE — Telephone Encounter (Signed)
-----   Message from Osker Mason, [place] sent at 09/29/2021  4:09 PM EDT ----- Marykay Lex,   It looks like the last Qtc during his hospitalization at [facility] per [place] was 520 (09/16/21).   Trazodone appears to have a relatively low risk of Qtc, however, the safest thing might be to bring him back in for EKG and see where he is now, as that prior one was when he had just finished the amio load.   The risk is dose dependent, so if Qtc has improved to only being moderately prolonged, I think sleep dosing would be appropriate (ie, wouldn't use trazodone at antidepressant dosing in this situation).   Catie  ----- Message ----- From: Leone Haven, MD Sent: 09/29/2021   3:49 PM EDT To: Osker Mason, RPH-CPP  Hey Catie, sorry to bother you with this. I wanted to see if you could provide some guidance. This patient has been having sleeping issues after a recent esophagectomy. His sleep issues do not seem to be related to anxiety/depression, EtOH intake, caffeine intake, or pain. I discussed a short term trial of trazodone with him, though noted his QTc is mildly prolonged on the most recent EKG in our system. He is also on amiodarone for recent issues with afib during his esophagectomy. I wanted to see if the potential for QT prolongation is truly a concern with the trazodone. If it is then I will have to consider something like Lorrin Mais though will need to discuss potential risks with the patient. Thanks.

## 2021-10-03 NOTE — Telephone Encounter (Signed)
I called and LVM for the patient to call back. Jahmari Esbenshade,cma   

## 2021-10-06 ENCOUNTER — Encounter: Payer: Self-pay | Admitting: Family Medicine

## 2021-10-06 ENCOUNTER — Ambulatory Visit (INDEPENDENT_AMBULATORY_CARE_PROVIDER_SITE_OTHER): Payer: Medicare Other | Admitting: Family Medicine

## 2021-10-06 VITALS — BP 101/60 | HR 79 | Temp 97.8°F | Ht 71.0 in | Wt 184.8 lb

## 2021-10-06 DIAGNOSIS — G479 Sleep disorder, unspecified: Secondary | ICD-10-CM | POA: Diagnosis not present

## 2021-10-06 DIAGNOSIS — R208 Other disturbances of skin sensation: Secondary | ICD-10-CM | POA: Diagnosis not present

## 2021-10-06 DIAGNOSIS — R9431 Abnormal electrocardiogram [ECG] [EKG]: Secondary | ICD-10-CM

## 2021-10-06 DIAGNOSIS — I10 Essential (primary) hypertension: Secondary | ICD-10-CM | POA: Diagnosis not present

## 2021-10-06 MED ORDER — AMLODIPINE BESYLATE 5 MG PO TABS: 5.0000 mg | ORAL_TABLET | Freq: Every day | ORAL | 1 refills | Status: DC

## 2021-10-06 MED ORDER — TRAZODONE HCL 50 MG PO TABS: 25.0000 mg | ORAL_TABLET | Freq: Every evening | ORAL | 3 refills | Status: DC | PRN

## 2021-10-06 NOTE — Assessment & Plan Note (Signed)
Focal burning sensation of left shin.  No abnormal findings on exam.  This has been improving today.  Discussed monitoring and if not resolving by next week he should let us know.  Discussed this could represent a nerve impingement that is just taking some time to resolve.

## 2021-10-06 NOTE — Patient Instructions (Signed)
Nice to see you. Please reduce your amlodipine to 5 mg once daily.  Please try to increase your water intake.  If you continue to feel lightheadedness despite these changes please let me know. If the burning sensation does not resolve by early next week please let me know. Please try the trazodone as we discussed for sleep.

## 2021-10-06 NOTE — Assessment & Plan Note (Signed)
Patient has had some lightheadedness last couple of days.  He will reduce his amlodipine back to 5 mg daily.  He will continue metoprolol 25 mg twice daily and losartan 100 mg daily.  If his lightheadedness does not improve and his blood pressure does not come back up he will let us know.  Discussed if he ever had a syncopal episode related to lightheadedness or any other cause he would need to be evaluated immediately.

## 2021-10-06 NOTE — Assessment & Plan Note (Signed)
QTc is now normal.  Patient will trial trazodone 25-50 mg nightly for sleep.  Discussed minimal dose-dependent risk of QTc prolongation with the trazodone.

## 2021-10-06 NOTE — Progress Notes (Addendum)
[name], MD Phone: [phone]  Andrew Mullins is a 66 y.o. male who presents today for follow-up.  Prolonged QTc: Noted on prior EKG.  He presents today to have follow-up EKG to determine the appropriateness of using trazodone for sleep issues.  Hypertension: Patient recently has increased the amlodipine to 10 mg.  He notes over the last couple of days he is felt lightheaded and his blood pressures have been down into the 27P systolically.  He has been drinking Gatorade and tea though no water today.  No chest pain or shortness of breath.  Superficial burning sensation left lower anterior leg: This started last night in the middle the night.  It was quite significant when it started though it is progressively improved throughout the day.  No numbness or tingling.  No burning sensation elsewhere in his body.  No overlying skin rash.  Social History   Tobacco Use  Smoking Status Former  Smokeless Tobacco Never    Current Outpatient Medications on File Prior to Visit  Medication Sig Dispense Refill   amiodarone (PACERONE) 200 MG tablet Take 200 mg by mouth daily.     apixaban (ELIQUIS) 5 MG TABS tablet Take 5 mg by mouth 2 (two) times daily.     atorvastatin (LIPITOR) 40 MG tablet Take 1 tablet (40 mg total) by mouth daily at 6 PM. 30 tablet 2   famciclovir (FAMVIR) 250 MG tablet Take 250 mg by mouth 2 (two) times daily.     metoprolol tartrate (LOPRESSOR) 25 MG tablet Take 25 mg by mouth 2 (two) times daily.     Multiple Vitamins-Minerals (MULTIVITAMIN ADULT) CHEW Chew 2 tablets by mouth every morning.     omeprazole (PRILOSEC) 20 MG capsule Take 20 mg by mouth daily.     polyethylene glycol (MIRALAX / GLYCOLAX) 17 g packet Take 17 g by mouth daily as needed for moderate constipation. 14 each 0   losartan (COZAAR) 100 MG tablet Take 1 tablet (100 mg total) by mouth daily. 30 tablet 0   No current facility-administered medications on file prior to visit.     ROS see  history of present illness  Objective  Physical Exam Vitals:   10/06/21 1351  BP: 101/60  Pulse: 79  Temp: 97.8 F (36.6 C)  SpO2: 98%    BP Readings from Last 3 Encounters:  10/06/21 101/60  09/29/21 130/79  08/24/21 130/82   Wt Readings from Last 3 Encounters:  10/06/21 184 lb 12.8 oz (83.8 kg)  09/29/21 182 lb 9.6 oz (82.8 kg)  08/24/21 193 lb (87.5 kg)    Physical Exam Constitutional:      General: He is not in acute distress.    Appearance: He is not diaphoretic.  Cardiovascular:     Rate and Rhythm: Normal rate and regular rhythm.     Heart sounds: Normal heart sounds.  Pulmonary:     Effort: Pulmonary effort is normal.     Breath sounds: Normal breath sounds.  Musculoskeletal:     Right lower leg: No edema.     Left lower leg: No edema.  Skin:    General: Skin is warm and dry.     Comments: No abnormalities of the skin of the left lower extremity, patient's sensation to light touch is intact bilateral lower extremities, there is no tenderness over the area of the burning sensation in his anterior left lower leg  Neurological:     Mental Status: He is alert.    EKG:  Normal sinus rhythm, rate 75, QTc 410  Assessment/Plan: Please see individual problem list.  Problem List Items Addressed This Visit     Essential hypertension (Chronic)    Patient has had some lightheadedness last couple of days.  He will reduce his amlodipine back to 5 mg daily.  He will continue metoprolol 25 mg twice daily and losartan 100 mg daily.  If his lightheadedness does not improve and his blood pressure does not come back up he will let us know.  Discussed if he ever had a syncopal episode related to lightheadedness or any other cause he would need to be evaluated immediately.      Relevant Medications   amLODipine (NORVASC) 5 MG tablet   Burning sensation of lower extremity    Focal burning sensation of left shin.  No abnormal findings on exam.  This has been improving today.   Discussed monitoring and if not resolving by next week he should let us know.  Discussed this could represent a nerve impingement that is just taking some time to resolve.      Sleeping difficulty - Primary    QTc is now normal.  Patient will trial trazodone 25-50 mg nightly for sleep.  Discussed minimal dose-dependent risk of QTc prolongation with the trazodone.      Relevant Medications   traZODone (DESYREL) 50 MG tablet   Other Visit Diagnoses     QT prolongation       Relevant Orders   EKG 12-Lead (Completed)       Return for as scheduled.   [name], MD [place]

## 2021-10-08 ENCOUNTER — Encounter: Payer: Self-pay | Admitting: Family Medicine

## 2021-10-08 DIAGNOSIS — I10 Essential (primary) hypertension: Secondary | ICD-10-CM

## 2021-10-09 MED ORDER — LOSARTAN POTASSIUM 100 MG PO TABS: 100.0000 mg | ORAL_TABLET | Freq: Every day | ORAL | 3 refills | Status: DC

## 2021-10-17 ENCOUNTER — Encounter: Payer: Self-pay | Admitting: Family Medicine

## 2021-10-17 DIAGNOSIS — I10 Essential (primary) hypertension: Secondary | ICD-10-CM

## 2021-10-17 MED ORDER — AMLODIPINE BESYLATE 5 MG PO TABS: 5.0000 mg | ORAL_TABLET | Freq: Every day | ORAL | 1 refills | Status: DC

## 2021-10-19 MED ORDER — AMLODIPINE BESYLATE 5 MG PO TABS: 5.0000 mg | ORAL_TABLET | Freq: Every day | ORAL | 1 refills | Status: DC

## 2021-10-19 NOTE — Addendum Note (Signed)
Addended by: Fulton Mole D on: 10/19/2021 10:11 AM   Modules accepted: Orders

## 2021-10-25 ENCOUNTER — Encounter: Payer: Self-pay | Admitting: Gastroenterology

## 2021-10-25 ENCOUNTER — Other Ambulatory Visit: Payer: Self-pay

## 2021-10-25 ENCOUNTER — Ambulatory Visit (INDEPENDENT_AMBULATORY_CARE_PROVIDER_SITE_OTHER): Payer: Medicare Other | Admitting: Gastroenterology

## 2021-10-25 ENCOUNTER — Telehealth: Payer: Self-pay

## 2021-10-25 VITALS — BP 120/74 | HR 66 | Temp 98.3°F | Resp 16 | Ht 71.0 in | Wt 182.2 lb

## 2021-10-25 DIAGNOSIS — R1310 Dysphagia, unspecified: Secondary | ICD-10-CM

## 2021-10-25 MED ORDER — OMEPRAZOLE 2 MG/ML ORAL SUSPENSION: 40.0000 mg | Freq: Two times a day (BID) | ORAL | 2 refills | Status: DC

## 2021-10-25 NOTE — Telephone Encounter (Signed)
Called patient but had to leave him a detailed message to call me back to know how he is doing and if he needed to be seen by Dr. Vicente Males later on this afternoon after he is done with scoping. Waiting for him to call back.

## 2021-10-25 NOTE — Progress Notes (Signed)
[name] MD, MRCP(U.K) [address]  [place]  [place], [place] [address]  Main: [phone]  Fax: [phone]   Primary Care Physician: Leone Haven, MD  Primary Gastroenterologist:  Dr. [name]   Chief Complaint  Patient presents with   Establish Care   Dysphagia  HPI: Andrew Mullins is a 66 y.o. 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 Eventually he had further dilations at [facility] which also failed and had to undergo three-incision esophagectomy on 09/08/21 . Pathology showed no evidence of malignancy . 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 J feeding tube removed in 10/2021.   He contacted his ENT yesterday with dysphagia  and was asked to see [place] for potential dilation.  On omeprazole 40 BID and pepcid, liquids go down slowly but not solids  Current Outpatient Medications  Medication Sig Dispense Refill   amiodarone (PACERONE) 200 MG tablet Take 200 mg by mouth daily.     amLODipine (NORVASC) 5 MG tablet Take 1 tablet (5 mg total) by mouth daily. 90 tablet 1   apixaban (ELIQUIS) 5 MG TABS tablet Take 5 mg by mouth 2 (two) times daily.     atorvastatin (LIPITOR) 40 MG tablet Take 1 tablet (40 mg total) by mouth daily at 6 PM. 30 tablet 2   famciclovir (FAMVIR) 250 MG tablet Take 250 mg by mouth 2 (two) times daily.     famotidine (PEPCID) 20 MG tablet Take 1 tablet by mouth at bedtime.     losartan (COZAAR) 100 MG tablet Take 1 tablet (100 mg total) by mouth daily. 30 tablet 3   metoprolol tartrate (LOPRESSOR) 25 MG tablet Take 25 mg by mouth 2 (two) times daily.     Multiple Vitamins-Minerals (MULTIVITAMIN ADULT) CHEW Chew 2 tablets by mouth every morning.     omeprazole (PRILOSEC) 20 MG capsule Take 20 mg by mouth daily.     polyethylene glycol (MIRALAX /  GLYCOLAX) 17 g packet Take 17 g by mouth daily as needed for moderate constipation. 14 each 0   traZODone (DESYREL) 50 MG tablet Take 0.5-1 tablets (25-50 mg total) by mouth at bedtime as needed for sleep. 30 tablet 3   No current facility-administered medications for this visit.    Allergies as of 10/25/2021 - Review Complete 10/25/2021  Allergen Reaction Noted   Hydralazine Other (See Comments) 04/24/2021    ROS:  General: Negative for anorexia, weight loss, fever, chills, fatigue, weakness. ENT: Negative for hoarseness, difficulty swallowing , nasal congestion. CV: Negative for chest pain, angina, palpitations, dyspnea on exertion, peripheral edema.  Respiratory: Negative for dyspnea at rest, dyspnea on exertion, cough, sputum, wheezing.  GI: See history of present illness. GU:  Negative for dysuria, hematuria, urinary incontinence, urinary frequency, nocturnal urination.  Endo: Negative for unusual weight change.    Physical Examination:   BP 120/74   Pulse 66   Temp 98.3 F (36.8 C) (Oral)   Resp 16   Ht '5\' 11"'$  (1.803 m)   Wt 182 lb 4 oz (82.7 kg)   SpO2 98%   BMI 25.42 kg/m   General: Well-nourished, well-developed in no acute distress.  Eyes: No icterus. Conjunctivae pink. Mouth: Oropharyngeal mucosa moist and pink , no lesions erythema or exudate.  Neuro: Alert and oriented x 3.  Grossly intact. Skin: Warm and dry, no jaundice.   Psych: Alert and cooperative, normal mood and affect.   Imaging Studies: No results found.  Assessment and Plan:   Andrew Mullins is a 66 y.o. y/o male s/p esophagectomy for refractory radiation induced esophageal stricture that failed dilations here to see me for dysphagia since surgery concerning for anastomotic stricture. EGD on Tuesday , eloquis holding instructions, change Omeprazole to liquid.   I have discussed alternative options, risks & benefits,  which include, but are not limited to, bleeding, infection,  perforation,respiratory complication & drug reaction.  The patient agrees with this plan & written consent will be obtained.     Dr [name]  MD,MRCP [facility]) Follow up in as needed

## 2021-10-25 NOTE — Telephone Encounter (Signed)
Called patient back and he stated that he would come in at 2:45 PM to see Dr. Vicente Males even though he is feeling pretty good but that Dr. Vicente Males had stated that he wanted to do a procedure on him this Friday.

## 2021-10-31 ENCOUNTER — Encounter: Payer: Self-pay | Admitting: Gastroenterology

## 2021-10-31 ENCOUNTER — Ambulatory Visit
Admission: RE | Admit: 2021-10-31 | Discharge: 2021-10-31 | Disposition: A | Discharge status: Home / Self Care | Payer: Medicare Other | Attending: Gastroenterology | Admitting: Gastroenterology

## 2021-10-31 ENCOUNTER — Encounter: Payer: Self-pay | Admitting: Anesthesiology

## 2021-10-31 ENCOUNTER — Encounter
Admission: RE | Disposition: A | Discharge status: Home / Self Care | Payer: Self-pay | Source: Home / Self Care | Attending: Gastroenterology

## 2021-10-31 DIAGNOSIS — Z539 Procedure and treatment not carried out, unspecified reason: Secondary | ICD-10-CM | POA: Diagnosis not present

## 2021-10-31 DIAGNOSIS — R1310 Dysphagia, unspecified: Secondary | ICD-10-CM | POA: Diagnosis not present

## 2021-10-31 SURGERY — ESOPHAGOGASTRODUODENOSCOPY (EGD) WITH PROPOFOL
Anesthesia: General

## 2021-10-31 NOTE — Anesthesia Preprocedure Evaluation (Deleted)
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)    GI/Hepatic GERD  ,Esophageal CA   Endo/Other    Renal/GU    Prostate CA    Musculoskeletal   Abdominal   Peds  Hematology   Anesthesia Other Findings   Reproductive/Obstetrics                             Anesthesia Physical Anesthesia Plan  ASA: 2  Anesthesia Plan: General   Post-op Pain Management:    Induction: Intravenous  PONV Risk Score and Plan: 2 and Propofol infusion, TIVA and Treatment may vary due to age or medical condition  Airway Management Planned: Natural Airway  Additional Equipment:   Intra-op Plan:   Post-operative Plan:   Informed Consent:   Plan Discussed with:   Anesthesia Plan Comments: (Case canceled 10/31/21.)       Anesthesia Quick Evaluation

## 2021-11-01 ENCOUNTER — Encounter: Payer: Self-pay | Admitting: Gastroenterology

## 2021-11-02 ENCOUNTER — Ambulatory Visit: Payer: Medicare Other | Attending: Cardiology | Admitting: Cardiology

## 2021-11-02 ENCOUNTER — Ambulatory Visit: Payer: Medicare Other | Admitting: Anesthesiology

## 2021-11-02 ENCOUNTER — Encounter: Payer: Self-pay | Admitting: Gastroenterology

## 2021-11-02 ENCOUNTER — Encounter: Payer: Self-pay | Admitting: Cardiology

## 2021-11-02 ENCOUNTER — Ambulatory Visit (INDEPENDENT_AMBULATORY_CARE_PROVIDER_SITE_OTHER): Payer: Medicare Other

## 2021-11-02 ENCOUNTER — Ambulatory Visit
Admission: RE | Admit: 2021-11-02 | Discharge: 2021-11-02 | Disposition: A | Discharge status: Home / Self Care | Payer: Medicare Other | Attending: Gastroenterology | Admitting: Gastroenterology

## 2021-11-02 ENCOUNTER — Encounter
Admission: RE | Disposition: A | Discharge status: Home / Self Care | Payer: Self-pay | Source: Home / Self Care | Attending: Gastroenterology

## 2021-11-02 ENCOUNTER — Other Ambulatory Visit: Payer: Self-pay

## 2021-11-02 VITALS — BP 138/82 | HR 69 | Ht 71.0 in | Wt 178.8 lb

## 2021-11-02 DIAGNOSIS — I10 Essential (primary) hypertension: Secondary | ICD-10-CM | POA: Insufficient documentation

## 2021-11-02 DIAGNOSIS — Z8673 Personal history of transient ischemic attack (TIA), and cerebral infarction without residual deficits: Secondary | ICD-10-CM | POA: Diagnosis not present

## 2021-11-02 DIAGNOSIS — K219 Gastro-esophageal reflux disease without esophagitis: Secondary | ICD-10-CM | POA: Diagnosis not present

## 2021-11-02 DIAGNOSIS — I480 Paroxysmal atrial fibrillation: Secondary | ICD-10-CM | POA: Diagnosis present

## 2021-11-02 DIAGNOSIS — K222 Esophageal obstruction: Secondary | ICD-10-CM | POA: Insufficient documentation

## 2021-11-02 DIAGNOSIS — Z8546 Personal history of malignant neoplasm of prostate: Secondary | ICD-10-CM | POA: Insufficient documentation

## 2021-11-02 DIAGNOSIS — E7800 Pure hypercholesterolemia, unspecified: Secondary | ICD-10-CM | POA: Insufficient documentation

## 2021-11-02 DIAGNOSIS — R1310 Dysphagia, unspecified: Secondary | ICD-10-CM

## 2021-11-02 DIAGNOSIS — Z980 Intestinal bypass and anastomosis status: Secondary | ICD-10-CM | POA: Diagnosis not present

## 2021-11-02 DIAGNOSIS — Z87891 Personal history of nicotine dependence: Secondary | ICD-10-CM | POA: Diagnosis not present

## 2021-11-02 HISTORY — PX: ESOPHAGOGASTRODUODENOSCOPY: SHX5428

## 2021-11-02 SURGERY — EGD (ESOPHAGOGASTRODUODENOSCOPY)
Anesthesia: General

## 2021-11-02 MED ORDER — PROPOFOL 500 MG/50ML IV EMUL
INTRAVENOUS | Status: DC | PRN
  Administered 2021-11-02: 150 ug/kg/min via INTRAVENOUS

## 2021-11-02 MED ORDER — LIDOCAINE HCL (CARDIAC) PF 100 MG/5ML IV SOSY
PREFILLED_SYRINGE | INTRAVENOUS | Status: DC | PRN
  Administered 2021-11-02: 100 mg via INTRAVENOUS

## 2021-11-02 MED ORDER — GLYCOPYRROLATE 0.2 MG/ML IJ SOLN
INTRAMUSCULAR | Status: DC | PRN
  Administered 2021-11-02: .2 mg via INTRAVENOUS

## 2021-11-02 MED ORDER — SODIUM CHLORIDE 0.9 % IV SOLN: INTRAVENOUS | Status: DC

## 2021-11-02 MED ORDER — PROPOFOL 10 MG/ML IV BOLUS
INTRAVENOUS | Status: DC | PRN
  Administered 2021-11-02 (×2): 20 mg via INTRAVENOUS
  Administered 2021-11-02: 60 mg via INTRAVENOUS

## 2021-11-02 NOTE — Transfer of Care (Signed)
Immediate Anesthesia Transfer of Care Note  Patient: Andrew Mullins  Procedure(s) Performed: ESOPHAGOGASTRODUODENOSCOPY (EGD)  Patient Location: Endoscopy Unit  Anesthesia Type:General  Level of Consciousness: drowsy and patient cooperative  Airway & Oxygen Therapy: Patient Spontanous Breathing and Patient connected to face mask oxygen  Post-op Assessment: Report given to RN and Post -op Vital signs reviewed and stable  Post vital signs: Reviewed and stable  Last Vitals:  Vitals Value Taken Time  BP 114/74 11/02/21 1115  Temp    Pulse 82 11/02/21 1115  Resp 20 11/02/21 1115  SpO2 100 % 11/02/21 1115    Last Pain:  Vitals:   11/02/21 1115  TempSrc:   PainSc: Asleep         Complications: No notable events documented.

## 2021-11-02 NOTE — Op Note (Signed)
[facility] Gastroenterology Patient Name: Andrew Mullins Procedure Date: 11/02/2021 10:50 AM MRN: [id number] Account #: [id number] Date of Birth: 10-Dec-1955 Admit Type: Outpatient Age: 66 Room: [facility] ENDO ROOM 1 Gender: Male Note Status: Finalized Instrument Name: Upper Endoscope [id number] Procedure:             Upper GI endoscopy Indications:           Dysphagia Providers:             Lucilla Lame MD, MD Referring MD:          Lucilla Lame MD, MD (Referring MD), Angela Adam. Caryl Bis                         (Referring MD) Medicines:             Propofol per Anesthesia Complications:         No immediate complications. Procedure:             Pre-Anesthesia Assessment:                        - Prior to the procedure, a History and Physical was                         performed, and patient medications and allergies were                         reviewed. The patient's tolerance of previous                         anesthesia was also reviewed. The risks and benefits                         of the procedure and the sedation options and risks                         were discussed with the patient. All questions were                         answered, and informed consent was obtained. Prior                         Anticoagulants: The patient has taken no previous                         anticoagulant or antiplatelet agents. ASA Grade                         Assessment: II - A patient with mild systemic disease.                         After reviewing the risks and benefits, the patient                         was deemed in satisfactory condition to undergo the                         procedure.  After obtaining informed consent, the endoscope was                         passed under direct vision. Throughout the procedure,                         the patient's blood pressure, pulse, and oxygen                         saturations were monitored  continuously. The Endoscope                         was introduced through the mouth, with the intention                         of advancing to the esophagus. The scope was advanced                         to the upper third of the esophagus before the                         procedure was aborted. Medications were given. The                         upper GI endoscopy was accomplished without                         difficulty. The patient tolerated the procedure well. Findings:      One benign-appearing, intrinsic severe stenosis was found in the upper       third of the esophagus. This stenosis measured 7 mm (inner diameter).       The stenosis was traversed after dilation. A TTS dilator was passed       through the scope. Dilation with an 08-23-08 mm pyloric balloon dilator       was performed. The dilation site was examined following endoscope       reinsertion and showed mild mucosal disruption.      An esophago-gastric anastomosis was found in the upper third of the       esophagus.      A suture was found in the upper third of the esophagus. Impression:            - Benign-appearing esophageal stenosis. Dilated.                        - An esophago-gastric anastomosis was found.                        - A suture was found in the esophagus.                        - No specimens collected. Recommendation:        - Discharge patient to home.                        - Resume previous diet.                        - Continue present medications.                        -  Repeat upper endoscopy in 3 weeks for retreatment. Procedure Code(s):     --- Professional ---                        [id number], 76, Esophagoscopy, flexible, transoral; with                         transendoscopic balloon dilation (less than 30 mm                         diameter) Diagnosis Code(s):     --- Professional ---                        R13.10, Dysphagia, unspecified                        K22.2, Esophageal  obstruction CPT copyright 2019 [employer]. All rights reserved. The codes documented in this report are preliminary and upon coder review may  be revised to meet current compliance requirements. Lucilla Lame MD, MD 11/02/2021 11:15:54 AM This report has been signed electronically. Number of Addenda: 0 Note Initiated On: 11/02/2021 10:50 AM Estimated Blood Loss:  Estimated blood loss: none.      [facility]

## 2021-11-02 NOTE — Progress Notes (Signed)
Cardiology Office Note:    Date:  11/02/2021   ID:  Andrew Mullins, DOB 07-11-1955, MRN [id number]  PCP:  Leone Haven, MD   [place] [facility] Cardiologist:  Kate Sable, MD     Referring MD: Leone Haven, MD   Chief Complaint  Patient presents with   New Patient (Initial Visit)    Afib, PCP ref, had event during procedure    History of Present Illness:    Andrew Mullins is a 66 y.o. male with a hx of hypertension, paroxysmal atrial fibrillation, hyperlipidemia, CVA 2020, former smoker x20 years, esophageal cancer s/p resection 08/2021 who presents to establish care due to history of A-fib.  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  Discussed dysphagia, eventually underwent multiple endoscopies with dilatations without resolution.  Had an esophagectomy 2 months ago at [facility].  Noted to be in atrial fibrillation postprocedure, since then, he was started on Eliquis, amiodarone.  He has had several endoscopies with dilatation, has one scheduled today due to dysphagia and narrowing of the upper esophagus.  Compliant with amiodarone, Eliquis, no bleeding complications.  Wondering if he can come off amiodarone as he may have had a significant headache for about a month.   Echocardiogram 06/2021 EF 55 to 60%.  Past Medical History:  Diagnosis Date   Cancer [facility])    esophageal   Complication of anesthesia    GERD (gastroesophageal reflux disease)    Hypertension    Prostate cancer ([place])    Stroke ([place]) 2020   no wealness    Past Surgical History:  Procedure Laterality Date   broken bones repair     COLONOSCOPY WITH PROPOFOL N/A 11/11/2020   Procedure: COLONOSCOPY WITH PROPOFOL;  Surgeon: [name], MD;  Location: [facility] ENDOSCOPY;  Service: Gastroenterology;  Laterality: N/A;   ESOPHAGOGASTRODUODENOSCOPY (EGD) WITH PROPOFOL N/A 02/18/2018    Procedure: ESOPHAGOGASTRODUODENOSCOPY (EGD) WITH PROPOFOL;  Surgeon: [name], MD;  Location: [facility] ENDOSCOPY;  Service: Gastroenterology;  Laterality: N/A;   ESOPHAGOGASTRODUODENOSCOPY (EGD) WITH PROPOFOL N/A 04/01/2018   Procedure: ESOPHAGOGASTRODUODENOSCOPY (EGD) WITH PROPOFOL;  Surgeon: [name], MD;  Location: [facility] ENDOSCOPY;  Service: Gastroenterology;  Laterality: N/A;   ESOPHAGOGASTRODUODENOSCOPY (EGD) WITH PROPOFOL N/A 12/02/2018   Procedure: ESOPHAGOGASTRODUODENOSCOPY (EGD) WITH PROPOFOL with Dilation;  Surgeon: [name], MD;  Location: [facility] ENDOSCOPY;  Service: Gastroenterology;  Laterality: N/A;   ESOPHAGOGASTRODUODENOSCOPY (EGD) WITH PROPOFOL N/A 05/19/2019   Procedure: ESOPHAGOGASTRODUODENOSCOPY (EGD) WITH PROPOFOL;  Surgeon: [name], MD;  Location: [facility] ENDOSCOPY;  Service: Gastroenterology;  Laterality: N/A;   ESOPHAGOGASTRODUODENOSCOPY (EGD) WITH PROPOFOL N/A 05/18/2019   Procedure: ESOPHAGOGASTRODUODENOSCOPY (EGD) WITH PROPOFOL with Dilation;  Surgeon: [name], MD;  Location: [facility] ENDOSCOPY;  Service: Gastroenterology;  Laterality: N/A;   ESOPHAGOGASTRODUODENOSCOPY (EGD) WITH PROPOFOL N/A 06/25/2019   Procedure: ESOPHAGOGASTRODUODENOSCOPY (EGD) WITH PROPOFOL  with Dilation;  Surgeon: [name], MD;  Location: [facility] ENDOSCOPY;  Service: Gastroenterology;  Laterality: N/A;   ESOPHAGOGASTRODUODENOSCOPY (EGD) WITH PROPOFOL N/A 07/09/2019   Procedure: ESOPHAGOGASTRODUODENOSCOPY (EGD) WITH PROPOFOL with Dilation;  Surgeon: [name], MD;  Location: [facility] ENDOSCOPY;  Service: Gastroenterology;  Laterality: N/A;  Pt requests early morning   ESOPHAGOGASTRODUODENOSCOPY (EGD) WITH PROPOFOL N/A 08/11/2019   Procedure: ESOPHAGOGASTRODUODENOSCOPY (EGD) WITH PROPOFOL with Dilation;  Surgeon: [name], MD;  Location: [facility] ENDOSCOPY;  Service: Gastroenterology;  Laterality: N/A;   ESOPHAGOGASTRODUODENOSCOPY (EGD) WITH PROPOFOL N/A 09/11/2019   Procedure:  ESOPHAGOGASTRODUODENOSCOPY (EGD) WITH PROPOFOL with  Dilation;  Surgeon: [name], MD;  Location: [facility] ENDOSCOPY;  Service: Gastroenterology;  Laterality: N/A;   ESOPHAGOGASTRODUODENOSCOPY (EGD) WITH PROPOFOL N/A 10/02/2019   Procedure: ESOPHAGOGASTRODUODENOSCOPY (EGD) WITH PROPOFOL with Dilation;  Surgeon: [name], MD;  Location: [facility] ENDOSCOPY;  Service: Gastroenterology;  Laterality: N/A;   ESOPHAGOGASTRODUODENOSCOPY (EGD) WITH PROPOFOL N/A 11/04/2019   Procedure: ESOPHAGOGASTRODUODENOSCOPY (EGD) WITH PROPOFOL;  Surgeon: [name], MD;  Location: [facility] ENDOSCOPY;  Service: Gastroenterology;  Laterality: N/A;   ESOPHAGOGASTRODUODENOSCOPY (EGD) WITH PROPOFOL N/A 02/05/2020   Procedure: ESOPHAGOGASTRODUODENOSCOPY (EGD) WITH PROPOFOL;  Surgeon: [name], MD;  Location: [facility] ENDOSCOPY;  Service: Gastroenterology;  Laterality: N/A;   ESOPHAGOGASTRODUODENOSCOPY (EGD) WITH PROPOFOL N/A 02/19/2020   Procedure: ESOPHAGOGASTRODUODENOSCOPY (EGD) WITH PROPOFOL;  Surgeon: [name], MD;  Location: [facility] ENDOSCOPY;  Service: Gastroenterology;  Laterality: N/A;   ESOPHAGOGASTRODUODENOSCOPY (EGD) WITH PROPOFOL N/A 03/10/2020   Procedure: ESOPHAGOGASTRODUODENOSCOPY (EGD) WITH PROPOFOL;  Surgeon: [name], MD;  Location: [facility] ENDOSCOPY;  Service: Gastroenterology;  Laterality: N/A;   ESOPHAGOGASTRODUODENOSCOPY (EGD) WITH PROPOFOL N/A 03/31/2020   Procedure: ESOPHAGOGASTRODUODENOSCOPY (EGD) WITH PROPOFOL;  Surgeon: [name], MD;  Location: [facility] ENDOSCOPY;  Service: Gastroenterology;  Laterality: N/A;  7:30 AM PROCEDURE PER DR ANNA C-19 TEST ON 03/30/2020 AM   ESOPHAGOGASTRODUODENOSCOPY (EGD) WITH PROPOFOL N/A 04/22/2020   Procedure: ESOPHAGOGASTRODUODENOSCOPY (EGD) WITH PROPOFOL;  Surgeon: [name], MD;  Location: [facility] ENDOSCOPY;  Service: Gastroenterology;  Laterality: N/A;   ESOPHAGOGASTRODUODENOSCOPY (EGD) WITH PROPOFOL N/A 05/26/2020   Procedure: ESOPHAGOGASTRODUODENOSCOPY (EGD) WITH PROPOFOL;   Surgeon: [name], MD;  Location: [facility] ENDOSCOPY;  Service: Gastroenterology;  Laterality: N/A;   EYE SURGERY     retinal detatchment   FRACTURE SURGERY     radical prostate     THORACIC LAMINECTOMY FOR EPIDURAL ABSCESS Bilateral 07/02/2021   Procedure: THORACIC LAMINECTOMY FOR EPIDURAL ABSCESS;  Surgeon: Meade Maw, MD;  Location: [facility] ORS;  Service: Neurosurgery;  Laterality: Bilateral;   TONSILLECTOMY      Current Medications: Current Meds  Medication Sig   amiodarone (PACERONE) 200 MG tablet Take 200 mg by mouth daily.   apixaban (ELIQUIS) 5 MG TABS tablet Take 5 mg by mouth 2 (two) times daily.   atorvastatin (LIPITOR) 40 MG tablet Take 1 tablet (40 mg total) by mouth daily at 6 PM.   famciclovir (FAMVIR) 250 MG tablet Take 250 mg by mouth 2 (two) times daily.   famotidine (PEPCID) 20 MG tablet Take 1 tablet by mouth at bedtime.   losartan (COZAAR) 100 MG tablet Take 1 tablet (100 mg total) by mouth daily.   metoprolol tartrate (LOPRESSOR) 25 MG tablet Take 25 mg by mouth 2 (two) times daily.   Multiple Vitamins-Minerals (MULTIVITAMIN ADULT) CHEW Chew 2 tablets by mouth every morning.   omeprazole (KONVOMEP) 2 mg/mL SUSP oral suspension Take 20 mLs (40 mg total) by mouth 2 (two) times daily before a meal.   omeprazole (PRILOSEC) 20 MG capsule Take 20 mg by mouth daily.   polyethylene glycol (MIRALAX / GLYCOLAX) 17 g packet Take 17 g by mouth daily as needed for moderate constipation.   traZODone (DESYREL) 50 MG tablet Take 0.5-1 tablets (25-50 mg total) by mouth at bedtime as needed for sleep.   [DISCONTINUED] amLODipine (NORVASC) 5 MG tablet Take 1 tablet (5 mg total) by mouth daily.     Allergies:   Hydralazine   Social History   Socioeconomic History   Marital status: Married    Spouse name: Not on file   Number of children: Not on file  Years of education: Not on file   Highest education level: Not on file  Occupational History   Not on file  Tobacco Use    Smoking status: Former   Smokeless tobacco: Never  Vaping Use   Vaping Use: Never used  Substance and Sexual Activity   Alcohol use: Not Currently   Drug use: Never   Sexual activity: Not on file  Other Topics Concern   Not on file  Social History Narrative   Lives at home with wife   Social Determinants of Health   Financial Resource Strain: Not on file  Food Insecurity: Not on file  Transportation Needs: Not on file  Physical Activity: Not on file  Stress: Not on file  Social Connections: Not on file     Family History: The patient's family history is not on file.  ROS:   Please see the history of present illness.     All other systems reviewed and are negative.  EKGs/Labs/Other Studies Reviewed:    The following studies were reviewed today:   EKG:  EKG is  ordered today.  The ekg ordered today demonstrates normal sinus rhythm, nonspecific T wave abnormality.  Recent Labs: 07/07/2021: Magnesium 2.0 07/09/2021: TSH 2.706 09/29/2021: ALT 30; BUN 20; Creat 0.62; Hemoglobin 10.5; Platelets 400; Potassium 4.3; Sodium 137  Recent Lipid Panel    Component Value Date/Time   CHOL 206 (H) 09/18/2018 0511   TRIG 132 09/18/2018 0511   HDL 31 (L) 09/18/2018 0511   CHOLHDL 6.6 09/18/2018 0511   VLDL 26 09/18/2018 0511   LDLCALC 149 (H) 09/18/2018 0511     Risk Assessment/Calculations:               Physical Exam:    VS:  BP 138/82 (BP Location: Right Arm)   Pulse 69   Ht '5\' 11"'$  (1.803 m)   Wt 178 lb 12.8 oz (81.1 kg)   SpO2 99%   BMI 24.94 kg/m     Wt Readings from Last 3 Encounters:  11/02/21 178 lb 12.8 oz (81.1 kg)  10/31/21 180 lb 5.7 oz (81.8 kg)  10/25/21 182 lb 4 oz (82.7 kg)     GEN:  Well nourished, well developed in no acute distress HEENT: Normal NECK: No JVD; No carotid bruits LYMPHATICS: No lymphadenopathy CARDIAC: RRR, no murmurs, rubs, gallops RESPIRATORY:  Clear to auscultation without rales, wheezing or rhonchi  ABDOMEN: Soft,  non-tender, non-distended MUSCULOSKELETAL:  No edema; No deformity  SKIN: Warm and dry NEUROLOGIC:  Alert and oriented x 3 PSYCHIATRIC:  Normal affect   ASSESSMENT:    1. Paroxysmal atrial fibrillation (HCC)   2. Primary hypertension   3. Pure hypercholesterolemia    PLAN:    In order of problems listed above:  Paroxysmal atrial fibrillation, CHA2DS2-VASc of 4.  A-fib occurred in the context of esophagectomy postop.  While this may be a lone event, patient had a stroke 3 years ago, unsure if this is A-fib related.  As such, will recommend continuing Eliquis if there is no bleeding issues.  Have patient follow-up in the A-fib clinic/EP.  Continue Lopressor.  Will stop amiodarone, place cardiac monitor to evaluate any A-fib recurrence.  Echocardiogram showed normal EF, normal LA size. Hypertension, BP reasonable.  Continue Norvasc, losartan, Lopressor. Hyperlipidemia, continue Lipitor 40 mg daily  Follow-up in 6 to 8 weeks       Medication Adjustments/Labs and Tests Ordered: Current medicines are reviewed at length with the patient today.  Concerns regarding  medicines are outlined above.  Orders Placed This Encounter  Procedures   Ambulatory referral to Cardiac Electrophysiology   LONG TERM MONITOR (3-14 DAYS)   EKG 12-Lead   No orders of the defined types were placed in this encounter.   Patient Instructions  Medication Instructions:   Your physician has recommended you make the following change in your medication:    STOP taking your Amiodarone.  *If you need a refill on your cardiac medications before your next appointment, please call your pharmacy*    Testing/Procedures:  Your physician has recommended that you wear a Zio XT monitor for 2 weeks. This will be mailed to your home address in 4-5 business days.   Your clinician has requested a Zio heart rhythm monitor by [employer] to be mailed to your home for you to wear for 14 days. You should expect a small box to  arrive via USPS (or FedEx in some cases) within this next week. If you do not receive it please call [employer] at [phone].  Closely watching your heart at this time will help your care team understand more and provide information needed to develop your plan of care.  Please apply your Zio patch monitor the day you receive it. Keep this packaging, you will use this to return your Zio monitor.  You will easily be able to apply the monitor with the instructions provided in the Patient Guide.  If you need assistance, [employer] representatives are available 24/7 at [phone].  You can also download the [facility] app on your phone to view detailed application instructions and log symptoms.  After you wear your monitor for 14 days, place it back in the blue box or envelope, along with your Symptom Log.  To send your monitor back: Simply use the pre-addressed and pre-paid box/envelope.  Send it back through [employer] the same day you remove it via your local post office or by placing it in your mailbox.  As soon as we receive the results, they will be reviewed and your clinician will contact you.  For the first 24 hours- it is essential to not shower or exercise, to allow the patch to adhere to your skin. Avoid excessive sweating to help maximize wear time. Do not submerge the device, no hot tubs, and no swimming pools. Keep any lotions or oils away from the patch. After 24 hours you may shower with the patch on. Take brief showers with your back facing the shower head.  Do not remove patch once it has been placed because that will interrupt data and decrease adhesive wear time. Push the button when you have any symptoms and write down what you were feeling. Once you have completed wearing your monitor, remove and place into box which has postage paid and place in your outgoing mailbox.  If for some reason you have misplaced your box then call our office and we can provide another box and/or  mail it off for you.     Follow-Up: At [facility] you and your health needs are our priority.  As part of our continuing mission to provide you with exceptional heart care, we have created designated Provider Care Teams.  These Care Teams include your primary Cardiologist (physician) and Advanced Practice Providers (APPs -  Physician Assistants and Nurse Practitioners) who all work together to provide you with the care you need, when you need it.  We recommend signing up for the patient portal called "MyChart".  Sign up information is  provided on this After Visit Summary.  MyChart is used to connect with patients for Virtual Visits (Telemedicine).  Patients are able to view lab/test results, encounter notes, upcoming appointments, etc.  Non-urgent messages can be sent to your provider as well.   To learn more about what you can do with MyChart, go to [email or web address].    Your next appointment:   6-8 week(s)  The format for your next appointment:   In Person  Provider:   You may see Kate Sable, MD or one of the following Advanced Practice Providers on your designated Care Team:   Murray Hodgkins, NP Christell Faith, PA-C Cadence Kathlen Mody, PA-C Gerrie Nordmann, NP    Other Instructions   Important Information About Sugar         Signed, Kate Sable, MD  11/02/2021 9:59 AM    [place]

## 2021-11-02 NOTE — H&P (Signed)
Lucilla Lame, MD [address] [place], [place] [address] Phone:[phone] Fax : [phone]  Primary Care Physician:  Leone Haven, MD Primary Gastroenterologist:  Dr. Allen Norris  Pre-Procedure History & Physical: HPI:  Andrew Mullins is a 66 y.o. male is here for an endoscopy.   Past Medical History:  Diagnosis Date   Cancer [facility])    esophageal   Complication of anesthesia    GERD (gastroesophageal reflux disease)    Hypertension    Prostate cancer ([place])    Stroke ([place]) 2020   no wealness    Past Surgical History:  Procedure Laterality Date   broken bones repair     COLONOSCOPY WITH PROPOFOL N/A 11/11/2020   Procedure: COLONOSCOPY WITH PROPOFOL;  Surgeon: [name], MD;  Location: [facility] ENDOSCOPY;  Service: Gastroenterology;  Laterality: N/A;   ESOPHAGOGASTRODUODENOSCOPY (EGD) WITH PROPOFOL N/A 02/18/2018   Procedure: ESOPHAGOGASTRODUODENOSCOPY (EGD) WITH PROPOFOL;  Surgeon: [name], MD;  Location: [facility] ENDOSCOPY;  Service: Gastroenterology;  Laterality: N/A;   ESOPHAGOGASTRODUODENOSCOPY (EGD) WITH PROPOFOL N/A 04/01/2018   Procedure: ESOPHAGOGASTRODUODENOSCOPY (EGD) WITH PROPOFOL;  Surgeon: [name], MD;  Location: [facility] ENDOSCOPY;  Service: Gastroenterology;  Laterality: N/A;   ESOPHAGOGASTRODUODENOSCOPY (EGD) WITH PROPOFOL N/A 12/02/2018   Procedure: ESOPHAGOGASTRODUODENOSCOPY (EGD) WITH PROPOFOL with Dilation;  Surgeon: [name], MD;  Location: [facility] ENDOSCOPY;  Service: Gastroenterology;  Laterality: N/A;   ESOPHAGOGASTRODUODENOSCOPY (EGD) WITH PROPOFOL N/A 05/19/2019   Procedure: ESOPHAGOGASTRODUODENOSCOPY (EGD) WITH PROPOFOL;  Surgeon: [name], MD;  Location: [facility] ENDOSCOPY;  Service: Gastroenterology;  Laterality: N/A;   ESOPHAGOGASTRODUODENOSCOPY (EGD) WITH PROPOFOL N/A 05/18/2019   Procedure: ESOPHAGOGASTRODUODENOSCOPY (EGD) WITH PROPOFOL with Dilation;  Surgeon: [name], MD;  Location: [facility] ENDOSCOPY;  Service:  Gastroenterology;  Laterality: N/A;   ESOPHAGOGASTRODUODENOSCOPY (EGD) WITH PROPOFOL N/A 06/25/2019   Procedure: ESOPHAGOGASTRODUODENOSCOPY (EGD) WITH PROPOFOL  with Dilation;  Surgeon: [name], MD;  Location: [facility] ENDOSCOPY;  Service: Gastroenterology;  Laterality: N/A;   ESOPHAGOGASTRODUODENOSCOPY (EGD) WITH PROPOFOL N/A 07/09/2019   Procedure: ESOPHAGOGASTRODUODENOSCOPY (EGD) WITH PROPOFOL with Dilation;  Surgeon: [name], MD;  Location: [facility] ENDOSCOPY;  Service: Gastroenterology;  Laterality: N/A;  Pt requests early morning   ESOPHAGOGASTRODUODENOSCOPY (EGD) WITH PROPOFOL N/A 08/11/2019   Procedure: ESOPHAGOGASTRODUODENOSCOPY (EGD) WITH PROPOFOL with Dilation;  Surgeon: [name], MD;  Location:  [facility] ENDOSCOPY;  Service: Gastroenterology;  Laterality: N/A;   ESOPHAGOGASTRODUODENOSCOPY (EGD) WITH PROPOFOL N/A 09/11/2019   Procedure: ESOPHAGOGASTRODUODENOSCOPY (EGD) WITH PROPOFOL with Dilation;  Surgeon: [name], MD;  Location: [facility] ENDOSCOPY;  Service: Gastroenterology;  Laterality: N/A;   ESOPHAGOGASTRODUODENOSCOPY (EGD) WITH PROPOFOL N/A 10/02/2019   Procedure: ESOPHAGOGASTRODUODENOSCOPY (EGD) WITH PROPOFOL with Dilation;  Surgeon: [name], MD;  Location: [facility] ENDOSCOPY;  Service: Gastroenterology;  Laterality: N/A;   ESOPHAGOGASTRODUODENOSCOPY (EGD) WITH PROPOFOL N/A 11/04/2019   Procedure: ESOPHAGOGASTRODUODENOSCOPY (EGD) WITH PROPOFOL;  Surgeon: [name], MD;  Location: [facility] ENDOSCOPY;  Service: Gastroenterology;  Laterality: N/A;   ESOPHAGOGASTRODUODENOSCOPY (EGD) WITH PROPOFOL N/A 02/05/2020   Procedure: ESOPHAGOGASTRODUODENOSCOPY (EGD) WITH PROPOFOL;  Surgeon: [name], MD;  Location: [facility] ENDOSCOPY;  Service: Gastroenterology;  Laterality: N/A;   ESOPHAGOGASTRODUODENOSCOPY (EGD) WITH PROPOFOL N/A 02/19/2020   Procedure: ESOPHAGOGASTRODUODENOSCOPY (EGD) WITH PROPOFOL;  Surgeon: [name], MD;  Location: [facility] ENDOSCOPY;  Service: Gastroenterology;  Laterality: N/A;    ESOPHAGOGASTRODUODENOSCOPY (EGD) WITH PROPOFOL N/A 03/10/2020   Procedure: ESOPHAGOGASTRODUODENOSCOPY (EGD) WITH PROPOFOL;  Surgeon: [name], MD;  Location: [facility] ENDOSCOPY;  Service: Gastroenterology;  Laterality: N/A;   ESOPHAGOGASTRODUODENOSCOPY (EGD) WITH PROPOFOL N/A 03/31/2020   Procedure: ESOPHAGOGASTRODUODENOSCOPY (EGD) WITH  PROPOFOL;  Surgeon: [name], MD;  Location: [facility] ENDOSCOPY;  Service: Gastroenterology;  Laterality: N/A;  7:30 AM PROCEDURE PER DR ANNA C-19 TEST ON 03/30/2020 AM   ESOPHAGOGASTRODUODENOSCOPY (EGD) WITH PROPOFOL N/A 04/22/2020   Procedure: ESOPHAGOGASTRODUODENOSCOPY (EGD) WITH PROPOFOL;  Surgeon: [name], MD;  Location: [facility] ENDOSCOPY;  Service: Gastroenterology;  Laterality: N/A;   ESOPHAGOGASTRODUODENOSCOPY (EGD) WITH PROPOFOL N/A 05/26/2020   Procedure: ESOPHAGOGASTRODUODENOSCOPY (EGD) WITH PROPOFOL;  Surgeon: [name], MD;  Location: [facility] ENDOSCOPY;  Service: Gastroenterology;  Laterality: N/A;   EYE SURGERY     retinal detatchment   FRACTURE SURGERY     radical prostate     THORACIC LAMINECTOMY FOR EPIDURAL ABSCESS Bilateral 07/02/2021   Procedure: THORACIC LAMINECTOMY FOR EPIDURAL ABSCESS;  Surgeon: Meade Maw, MD;  Location: [facility] ORS;  Service: Neurosurgery;  Laterality: Bilateral;   TONSILLECTOMY      Prior to Admission medications   Medication Sig Start Date End Date Taking? Authorizing Provider  amiodarone (PACERONE) 200 MG tablet Take 200 mg by mouth daily. 09/18/21  Yes [provider]  atorvastatin (LIPITOR) 40 MG tablet Take 1 tablet (40 mg total) by mouth daily at 6 PM. 09/18/18  Yes Fritzi Mandes, MD  famciclovir (FAMVIR) 250 MG tablet Take 250 mg by mouth 2 (two) times daily.   Yes [provider]  famotidine (PEPCID) 20 MG tablet Take 1 tablet by mouth at bedtime. 10/24/21 02/21/22 Yes [provider]  losartan (COZAAR) 100 MG tablet Take 1 tablet (100 mg total) by mouth daily. 10/09/21 02/06/22 Yes  Leone Haven, MD  metoprolol tartrate (LOPRESSOR) 25 MG tablet Take 25 mg by mouth 2 (two) times daily.   Yes [provider]  Multiple Vitamins-Minerals (MULTIVITAMIN ADULT) CHEW Chew 2 tablets by mouth every morning.   Yes [provider]  omeprazole (KONVOMEP) 2 mg/mL SUSP oral suspension Take 20 mLs (40 mg total) by mouth 2 (two) times daily before a meal. 10/25/21  Yes [name], MD  omeprazole (PRILOSEC) 20 MG capsule Take 20 mg by mouth daily.   Yes [provider]  traZODone (DESYREL) 50 MG tablet Take 0.5-1 tablets (25-50 mg total) by mouth at bedtime as needed for sleep. 10/06/21  Yes Leone Haven, MD  apixaban (ELIQUIS) 5 MG TABS tablet Take 5 mg by mouth 2 (two) times daily.    [provider]  polyethylene glycol (MIRALAX / GLYCOLAX) 17 g packet Take 17 g by mouth daily as needed for moderate constipation. 07/12/21   Sidney Ace, MD    Allergies as of 10/31/2021 - Review Complete 10/31/2021  Allergen Reaction Noted   Hydralazine Other (See Comments) 04/24/2021    History reviewed. No pertinent family history.  Social History   Socioeconomic History   Marital status: Married    Spouse name: Not on file   Number of children: Not on file   Years of education: Not on file   Highest education level: Not on file  Occupational History   Not on file  Tobacco Use   Smoking status: Former   Smokeless tobacco: Never  Vaping Use   Vaping Use: Never used  Substance and Sexual Activity   Alcohol use: Not Currently   Drug use: Never   Sexual activity: Not on file  Other Topics Concern   Not on file  Social History Narrative   Lives at home with wife   Social Determinants of Health   Financial Resource Strain: Not on file  Food Insecurity: Not  on file  Transportation Needs: Not on file  Physical Activity: Not on file  Stress: Not on file  Social Connections: Not on file  Intimate Partner Violence: Not on file     Review of Systems: See HPI, otherwise negative ROS  Physical Exam: BP 132/80   Pulse 70   Temp (!) 96.7 F (35.9 C) (Temporal)   Resp 18   Ht '5\' 11"'$  (1.803 m)   Wt 81.1 kg   SpO2 100%   BMI 24.94 kg/m  General:   Alert,  pleasant and cooperative in NAD Head:  Normocephalic and atraumatic. Neck:  Supple; no masses or thyromegaly. Lungs:  Clear throughout to auscultation.    Heart:  Regular rate and rhythm. Abdomen:  Soft, nontender and nondistended. Normal bowel sounds, without guarding, and without rebound.   Neurologic:  Alert and  oriented x4;  grossly normal neurologically.  Impression/Plan: Andrew Mullins is here for an endoscopy to be performed for dysphagia  Risks, benefits, limitations, and alternatives regarding  endoscopy have been reviewed with the patient.  Questions have been answered.  All parties agreeable.   Lucilla Lame, MD  11/02/2021, 10:57 AM

## 2021-11-02 NOTE — Patient Instructions (Signed)
Medication Instructions:   Your physician has recommended you make the following change in your medication:    STOP taking your Amiodarone.  *If you need a refill on your cardiac medications before your next appointment, please call your pharmacy*    Testing/Procedures:  Your physician has recommended that you wear a Zio XT monitor for 2 weeks. This will be mailed to your home address in 4-5 business days.   Your clinician has requested a Zio heart rhythm monitor by [employer] to be mailed to your home for you to wear for 14 days. You should expect a small box to arrive via USPS (or FedEx in some cases) within this next week. If you do not receive it please call [employer] at [phone].  Closely watching your heart at this time will help your care team understand more and provide information needed to develop your plan of care.  Please apply your Zio patch monitor the day you receive it. Keep this packaging, you will use this to return your Zio monitor.  You will easily be able to apply the monitor with the instructions provided in the Patient Guide.  If you need assistance, [employer] representatives are available 24/7 at [phone].  You can also download the [facility] app on your phone to view detailed application instructions and log symptoms.  After you wear your monitor for 14 days, place it back in the blue box or envelope, along with your Symptom Log.  To send your monitor back: Simply use the pre-addressed and pre-paid box/envelope.  Send it back through [employer] the same day you remove it via your local post office or by placing it in your mailbox.  As soon as we receive the results, they will be reviewed and your clinician will contact you.  For the first 24 hours- it is essential to not shower or exercise, to allow the patch to adhere to your skin. Avoid excessive sweating to help maximize wear time. Do not submerge the device, no hot tubs, and no swimming pools. Keep  any lotions or oils away from the patch. After 24 hours you may shower with the patch on. Take brief showers with your back facing the shower head.  Do not remove patch once it has been placed because that will interrupt data and decrease adhesive wear time. Push the button when you have any symptoms and write down what you were feeling. Once you have completed wearing your monitor, remove and place into box which has postage paid and place in your outgoing mailbox.  If for some reason you have misplaced your box then call our office and we can provide another box and/or mail it off for you.     Follow-Up: At [facility], you and your health needs are our priority.  As part of our continuing mission to provide you with exceptional heart care, we have created designated Provider Care Teams.  These Care Teams include your primary Cardiologist (physician) and Advanced Practice Providers (APPs -  Physician Assistants and Nurse Practitioners) who all work together to provide you with the care you need, when you need it.  We recommend signing up for the patient portal called "MyChart".  Sign up information is provided on this After Visit Summary.  MyChart is used to connect with patients for Virtual Visits (Telemedicine).  Patients are able to view lab/test results, encounter notes, upcoming appointments, etc.  Non-urgent messages can be sent to your provider as well.   To learn more about  what you can do with MyChart, go to [email or web address].    Your next appointment:   6-8 week(s)  The format for your next appointment:   In Person  Provider:   You may see Kate Sable, MD or one of the following Advanced Practice Providers on your designated Care Team:   Murray Hodgkins, NP Christell Faith, PA-C Cadence Kathlen Mody, PA-C Gerrie Nordmann, NP    Other Instructions   Important Information About Sugar

## 2021-11-02 NOTE — Anesthesia Preprocedure Evaluation (Addendum)
Anesthesia Evaluation  Patient identified by MRN, date of birth, ID band Patient awake    Reviewed: Allergy & Precautions, NPO status , Patient's Chart, lab work & pertinent test results, reviewed documented beta blocker date and time   History of Anesthesia Complications Negative for: history of anesthetic complications  Airway Mallampati: III  TM Distance: >3 FB Neck ROM: Full    Dental no notable dental hx.    Pulmonary neg sleep apnea, neg COPD, Patient abstained from smoking.Not current smoker, former smoker,    Pulmonary exam normal breath sounds clear to auscultation       Cardiovascular Exercise Tolerance: Good hypertension, Pt. on medications and Pt. on home beta blockers (-) Past MI, (-) Cardiac Stents and (-) CHF Normal cardiovascular exam+ dysrhythmias (afib with esophagectomy. 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 Spasmodic cough Now S/P vocal fold injection   Past Medical History: No date: Cancer ([place])     Comment:  esophageal No date: Complication of anesthesia No date: GERD (gastroesophageal reflux disease) No date: Hypertension No date: Prostate cancer ([place]) 2020: Stroke ([place])     Comment:  no wealness   Reproductive/Obstetrics                             Anesthesia Physical  Anesthesia Plan  ASA: 2  Anesthesia Plan: General   Post-op Pain Management: Minimal or no pain anticipated   Induction: Intravenous  PONV Risk Score and Plan: Propofol infusion and TIVA  Airway Management Planned: Natural Airway  Additional Equipment:   Intra-op Plan:   Post-operative Plan:   Informed Consent: I have reviewed the patients History and Physical, chart, labs and discussed the procedure including the risks, benefits and alternatives for the proposed anesthesia with the patient or authorized representative who has indicated his/her understanding and acceptance.     Dental Advisory Given  Plan Discussed with: Anesthesiologist, CRNA and Surgeon  Anesthesia Plan Comments:        Anesthesia Quick Evaluation

## 2021-11-03 NOTE — Anesthesia Postprocedure Evaluation (Signed)
Anesthesia Post Note  Patient: Andrew Mullins  Procedure(s) Performed: ESOPHAGOGASTRODUODENOSCOPY (EGD)  Patient location during evaluation: Endoscopy Anesthesia Type: General Level of consciousness: awake and alert Pain management: pain level controlled Vital Signs Assessment: post-procedure vital signs reviewed and stable Respiratory status: spontaneous breathing, nonlabored ventilation and respiratory function stable Cardiovascular status: blood pressure returned to baseline and stable Postop Assessment: no apparent nausea or vomiting Anesthetic complications: no   No notable events documented.   Last Vitals:  Vitals:   11/02/21 1125 11/02/21 1135  BP: 119/81 133/82  Pulse: 80 79  Resp: 19 20  Temp:    SpO2: 100% 100%    Last Pain:  Vitals:   11/02/21 1135  TempSrc:   PainSc: 0-No pain                 [place] Ouch

## 2021-11-06 DIAGNOSIS — I480 Paroxysmal atrial fibrillation: Secondary | ICD-10-CM

## 2021-11-10 ENCOUNTER — Telehealth: Payer: Self-pay

## 2021-11-10 ENCOUNTER — Other Ambulatory Visit: Payer: Self-pay

## 2021-11-10 ENCOUNTER — Ambulatory Visit: Payer: Medicare Other | Admitting: Family Medicine

## 2021-11-10 DIAGNOSIS — E785 Hyperlipidemia, unspecified: Secondary | ICD-10-CM

## 2021-11-10 MED ORDER — ATORVASTATIN CALCIUM 40 MG PO TABS: 40.0000 mg | ORAL_TABLET | Freq: Every day | ORAL | 2 refills | Status: DC

## 2021-11-10 NOTE — Telephone Encounter (Signed)
Patient's wife, Brigham Cobbins, called to states patient needs a refill for his atorvastatin (LIPITOR) 40 MG tablet.  Lucinda states patient has enough left for 10 days.  *Lucinda states patient's preferred pharmacy is [facility] in [place].

## 2021-11-10 NOTE — Telephone Encounter (Signed)
Medication sent to the pharmacy per patient request.  Wilbur Labuda,cma

## 2021-11-10 NOTE — Telephone Encounter (Signed)
Patient states he is due for a procedure at [facility] with Dr. Gayleen Orem and he can't schedule it until Dr. Caryl Bis calls Dr. Glenna Durand office [phone]) and let's him know how long he needs to be off of apixaban (ELIQUIS) 5 MG TABS tablet.  Patient states he would like for [place] to call him when we have spoken with Dr. Glenna Durand office.

## 2021-11-10 NOTE — Telephone Encounter (Signed)
Patient states he called Dr. Glenna Durand office and got their fax number for [place] - [phone].  Patient states he was prescribed apixaban (ELIQUIS) 5 MG TABS tablet while he was in the hospital and he explained to Dr. Glenna Durand office that the doctor who prescribed this medication for him is not his PCP.  Patient states that Dr. [name] is his PCP and we will be contacting them to let them know when patient needs to stop taking the medication relative to the procedure he will be having.

## 2021-11-13 NOTE — Telephone Encounter (Signed)
I believe cardiology is managing his eliquis. I will forward to his cardiologist to confirm how long he would need to be off of the eliquis for.

## 2021-11-13 NOTE — Telephone Encounter (Signed)
Pt called stating they cant schedule they know how llong pt need to stop taking elquis and [facility] does not have the referral [facility] nurse triage [phone]

## 2021-11-14 NOTE — Telephone Encounter (Signed)
I called and lVM informing the [facility] triage team that I faxed a letter through epic about the Eliquis for the patient.  Cricket Goodlin,cma

## 2021-11-14 NOTE — Telephone Encounter (Signed)
Noted.  The patient requested that this information be relayed to the [facility] nurse triage team.  The phone number is in the message from [place].  Can you try to call them sometime today to relay the Eliquis information?

## 2021-11-14 NOTE — Telephone Encounter (Signed)
I called and kim had spoken with the patient and he know to stop the eliquis 48 hours prior to the procedure I also informed him to ask after the procedure when he can restart and he understood. Andrew Mullins,cma

## 2021-11-14 NOTE — Telephone Encounter (Signed)
Patient states he is following-up on his message to find out how long he needs to be off his apixaban (ELIQUIS) 5 MG TABS tablet prior to surgery.  I read the messages from Dr. [name] and Dr. Kate Sable.  Patient states we need to call the [facility] Nurse Triage Team with the information.  Patient states their phone number is [phone].  Patient states they will be able to handle this more quickly.  Patient states this surgery is urgent, so he would like to get this taken care of as quickly as possible.  Patient states he would like to thank Dr. Caryl Bis and Dr. Garen Lah for taking care of this.

## 2021-11-14 NOTE — Telephone Encounter (Signed)
I called [facility] Triage and left a VM, I also sent a letter by fax to Dr. Starling Manns Triage team about the Eliquis.  Juel Ripley,cma

## 2021-11-20 ENCOUNTER — Ambulatory Visit: Payer: BC Managed Care – PPO | Admitting: Family Medicine

## 2021-11-24 ENCOUNTER — Ambulatory Visit (INDEPENDENT_AMBULATORY_CARE_PROVIDER_SITE_OTHER): Payer: Medicare Other | Admitting: Family Medicine

## 2021-11-24 ENCOUNTER — Encounter: Payer: Self-pay | Admitting: Family Medicine

## 2021-11-24 VITALS — BP 134/82 | HR 69 | Temp 98.3°F | Ht 71.0 in | Wt 179.8 lb

## 2021-11-24 DIAGNOSIS — K222 Esophageal obstruction: Secondary | ICD-10-CM

## 2021-11-24 DIAGNOSIS — C44319 Basal cell carcinoma of skin of other parts of face: Secondary | ICD-10-CM | POA: Diagnosis not present

## 2021-11-24 DIAGNOSIS — I10 Essential (primary) hypertension: Secondary | ICD-10-CM | POA: Diagnosis not present

## 2021-11-24 DIAGNOSIS — C4491 Basal cell carcinoma of skin, unspecified: Secondary | ICD-10-CM | POA: Insufficient documentation

## 2021-11-24 NOTE — Progress Notes (Signed)
[name], MD Phone: [phone]  Andrew Mullins is a 66 y.o. male who presents today for f/u.  HYPERTENSION Disease Monitoring Home BP Monitoring similar  Chest pain- no    Dyspnea- no Medications Compliance-  taking amlodipine, metoprolol, losartan.  Edema- no BMET    Component Value Date/Time   NA 137 09/29/2021 1434   K 4.3 09/29/2021 1434   CL 100 09/29/2021 1434   CO2 27 09/29/2021 1434   GLUCOSE 100 (H) 09/29/2021 1434   BUN 20 09/29/2021 1434   CREATININE 0.62 (L) 09/29/2021 1434   CALCIUM 9.0 09/29/2021 1434   GFRNONAA >60 08/17/2021 1257   GFRAA >60 09/17/2018 1201   Dysphagia: underwent recent dilation.  Patient notes he was dilated from 3 mm to 15 mm.  He goes back in a few weeks for another dilation.  Basal cell carcinoma: Patient had Mohs surgery for this recently.  Social History   Tobacco Use  Smoking Status Former  Smokeless Tobacco Never    Current Outpatient Medications on File Prior to Visit  Medication Sig Dispense Refill   amiodarone (PACERONE) 200 MG tablet Take 200 mg by mouth daily.     apixaban (ELIQUIS) 5 MG TABS tablet Take 5 mg by mouth 2 (two) times daily.     atorvastatin (LIPITOR) 40 MG tablet Take 1 tablet (40 mg total) by mouth daily at 6 PM. 30 tablet 2   famciclovir (FAMVIR) 250 MG tablet Take 250 mg by mouth 2 (two) times daily.     famotidine (PEPCID) 20 MG tablet Take 1 tablet by mouth at bedtime.     losartan (COZAAR) 100 MG tablet Take 1 tablet (100 mg total) by mouth daily. 30 tablet 3   metoprolol tartrate (LOPRESSOR) 25 MG tablet Take 25 mg by mouth 2 (two) times daily.     Multiple Vitamins-Minerals (MULTIVITAMIN ADULT) CHEW Chew 2 tablets by mouth every morning.     omeprazole (KONVOMEP) 2 mg/mL SUSP oral suspension Take 20 mLs (40 mg total) by mouth 2 (two) times daily before a meal. 1200 mL 2   omeprazole (PRILOSEC) 20 MG capsule Take 20 mg by mouth daily.     polyethylene glycol (MIRALAX / GLYCOLAX) 17 g  packet Take 17 g by mouth daily as needed for moderate constipation. 14 each 0   traZODone (DESYREL) 50 MG tablet Take 0.5-1 tablets (25-50 mg total) by mouth at bedtime as needed for sleep. 30 tablet 3   No current facility-administered medications on file prior to visit.     ROS see history of present illness  Objective  Physical Exam Vitals:   11/24/21 1626  BP: 134/82  Pulse: 69  Temp: 98.3 F (36.8 C)  SpO2: 99%    BP Readings from Last 3 Encounters:  11/24/21 134/82  11/02/21 133/82  11/02/21 138/82   Wt Readings from Last 3 Encounters:  11/24/21 179 lb 12.8 oz (81.6 kg)  11/02/21 178 lb 12.8 oz (81.1 kg)  11/02/21 178 lb 12.8 oz (81.1 kg)    Physical Exam Constitutional:      General: He is not in acute distress.    Appearance: He is not diaphoretic.  Cardiovascular:     Rate and Rhythm: Normal rate and regular rhythm.     Heart sounds: Normal heart sounds.  Pulmonary:     Effort: Pulmonary effort is normal.     Breath sounds: Normal breath sounds.  Skin:    General: Skin is warm and dry.  Neurological:  Mental Status: He is alert.      Assessment/Plan: Please see individual problem list.  Problem List Items Addressed This Visit     Essential hypertension - Primary (Chronic)    Adequately controlled.  He will continue amlodipine 5 mg daily, metoprolol 25 mg twice daily, and losartan 100 mg daily.  Follow-up in 3 months.      Stricture and stenosis of esophagus (Chronic)    Chronic issue.  He will continue omeprazole 20 mg twice daily.  He will continue to follow with GI.      Basal cell carcinoma    Patient will continue to see dermatology.        Return in about 3 months (around 02/24/2022).   [name], MD [place]

## 2021-11-24 NOTE — Assessment & Plan Note (Signed)
Chronic issue.  He will continue omeprazole 20 mg twice daily.  He will continue to follow with GI.

## 2021-11-24 NOTE — Assessment & Plan Note (Signed)
Patient will continue to see dermatology.

## 2021-11-24 NOTE — Assessment & Plan Note (Signed)
Adequately controlled.  He will continue amlodipine 5 mg daily, metoprolol 25 mg twice daily, and losartan 100 mg daily.  Follow-up in 3 months.

## 2021-12-12 ENCOUNTER — Encounter: Payer: Self-pay | Admitting: Family Medicine

## 2021-12-13 MED ORDER — APIXABAN 5 MG PO TABS: 5.0000 mg | ORAL_TABLET | Freq: Two times a day (BID) | ORAL | 1 refills | Status: DC

## 2021-12-20 ENCOUNTER — Other Ambulatory Visit: Payer: Self-pay | Admitting: Family Medicine

## 2021-12-20 ENCOUNTER — Institutional Professional Consult (permissible substitution): Payer: BC Managed Care – PPO | Admitting: Cardiology

## 2021-12-20 DIAGNOSIS — G479 Sleep disorder, unspecified: Secondary | ICD-10-CM

## 2021-12-21 ENCOUNTER — Telehealth: Payer: Self-pay | Admitting: Family Medicine

## 2021-12-21 ENCOUNTER — Ambulatory Visit: Payer: Medicare Other | Attending: Cardiology | Admitting: Cardiology

## 2021-12-21 ENCOUNTER — Encounter: Payer: Self-pay | Admitting: Cardiology

## 2021-12-21 ENCOUNTER — Other Ambulatory Visit: Payer: Self-pay | Admitting: Family Medicine

## 2021-12-21 VITALS — BP 148/84 | HR 67 | Wt 181.0 lb

## 2021-12-21 DIAGNOSIS — I10 Essential (primary) hypertension: Secondary | ICD-10-CM | POA: Insufficient documentation

## 2021-12-21 DIAGNOSIS — I77810 Thoracic aortic ectasia: Secondary | ICD-10-CM | POA: Diagnosis not present

## 2021-12-21 DIAGNOSIS — E7800 Pure hypercholesterolemia, unspecified: Secondary | ICD-10-CM | POA: Diagnosis not present

## 2021-12-21 DIAGNOSIS — I480 Paroxysmal atrial fibrillation: Secondary | ICD-10-CM | POA: Diagnosis not present

## 2021-12-21 NOTE — Progress Notes (Addendum)
Cardiology Office Note:    Date:  12/21/2021   ID:  Andrew Mullins, DOB Oct 30, 1955, MRN [id number]  PCP:  Leone Haven, MD   [place] Providers Cardiologist:  Kate Sable, MD     Referring MD: Leone Haven, MD   Chief Complaint  Patient presents with   Follow-up    6-8 week follow up, Monitor result, no new cardiac concerns     History of Present Illness:    Andrew Mullins is a 66 y.o. male with a hx of hypertension, paroxysmal atrial fibrillation, hyperlipidemia, CVA 2020, former smoker x20 years, esophageal cancer s/p resection 08/2021 who presents for follow-up.    Patient being seen for paroxysmal atrial fibrillation and hypertension.  Cardiac monitor placed to evaluate recurrence of atrial fibrillation.  A-fib occurred in the context of endoscopic procedure.  Unsure if this was a lone event.  He canceled appointment with EP/A-fib clinic, did not know.  Due to be helpful.  Tolerating Lopressor, Eliquis.  No bleeding issues.  Denies palpitations.  Did not take his BP meds this morning.  Amiodarone previously stopped.  Prior notes Echocardiogram 06/2021 EF 55 to 60%.  Past Medical History:  Diagnosis Date   Cancer [facility])    esophageal   Complication of anesthesia    GERD (gastroesophageal reflux disease)    History of CVA (cerebrovascular accident) 10/10/2018   Hypertension    Prostate cancer ([place])    Stroke ([place]) 2020   no wealness    Past Surgical History:  Procedure Laterality Date   broken bones repair     COLONOSCOPY WITH PROPOFOL N/A 11/11/2020   Procedure: COLONOSCOPY WITH PROPOFOL;  Surgeon: [name], MD;  Location: [facility] ENDOSCOPY;  Service: Gastroenterology;  Laterality: N/A;   ESOPHAGOGASTRODUODENOSCOPY N/A 11/02/2021   Procedure: ESOPHAGOGASTRODUODENOSCOPY (EGD);  Surgeon: Lucilla Lame, MD;  Location: [facility] ENDOSCOPY;  Service: Endoscopy;  Laterality: N/A;   ESOPHAGOGASTRODUODENOSCOPY (EGD) WITH PROPOFOL N/A 02/18/2018    Procedure: ESOPHAGOGASTRODUODENOSCOPY (EGD) WITH PROPOFOL;  Surgeon: [name], MD;  Location: [facility] ENDOSCOPY;  Service: Gastroenterology;  Laterality: N/A;   ESOPHAGOGASTRODUODENOSCOPY (EGD) WITH PROPOFOL N/A 04/01/2018   Procedure: ESOPHAGOGASTRODUODENOSCOPY (EGD) WITH PROPOFOL;  Surgeon: [name], MD;  Location: [facility] ENDOSCOPY;  Service: Gastroenterology;  Laterality: N/A;   ESOPHAGOGASTRODUODENOSCOPY (EGD) WITH PROPOFOL N/A 12/02/2018   Procedure: ESOPHAGOGASTRODUODENOSCOPY (EGD) WITH PROPOFOL with Dilation;  Surgeon: [name], MD;  Location: [facility] ENDOSCOPY;  Service: Gastroenterology;  Laterality: N/A;   ESOPHAGOGASTRODUODENOSCOPY (EGD) WITH PROPOFOL N/A 05/19/2019   Procedure: ESOPHAGOGASTRODUODENOSCOPY (EGD) WITH PROPOFOL;  Surgeon: [name], MD;  Location: [facility] ENDOSCOPY;  Service: Gastroenterology;  Laterality: N/A;   ESOPHAGOGASTRODUODENOSCOPY (EGD) WITH PROPOFOL N/A 05/18/2019   Procedure: ESOPHAGOGASTRODUODENOSCOPY (EGD) WITH PROPOFOL with Dilation;  Surgeon: [name], MD;  Location: [facility] ENDOSCOPY;  Service: Gastroenterology;  Laterality: N/A;   ESOPHAGOGASTRODUODENOSCOPY (EGD) WITH PROPOFOL N/A 06/25/2019   Procedure: ESOPHAGOGASTRODUODENOSCOPY (EGD) WITH PROPOFOL  with Dilation;  Surgeon: [name], MD;  Location: [facility] ENDOSCOPY;  Service: Gastroenterology;  Laterality: N/A;   ESOPHAGOGASTRODUODENOSCOPY (EGD) WITH PROPOFOL N/A 07/09/2019   Procedure: ESOPHAGOGASTRODUODENOSCOPY (EGD) WITH PROPOFOL with Dilation;  Surgeon: [name], MD;  Location: [facility] ENDOSCOPY;  Service: Gastroenterology;  Laterality: N/A;  Pt requests early morning   ESOPHAGOGASTRODUODENOSCOPY (EGD) WITH PROPOFOL N/A 08/11/2019   Procedure: ESOPHAGOGASTRODUODENOSCOPY (EGD) WITH PROPOFOL with Dilation;  Surgeon: [name], MD;  Location: [facility] ENDOSCOPY;  Service: Gastroenterology;  Laterality: N/A;   ESOPHAGOGASTRODUODENOSCOPY (EGD) WITH PROPOFOL N/A 09/11/2019   Procedure:  ESOPHAGOGASTRODUODENOSCOPY (EGD) WITH PROPOFOL  with Dilation;  Surgeon: [name], MD;  Location: [facility] ENDOSCOPY;  Service: Gastroenterology;  Laterality: N/A;   ESOPHAGOGASTRODUODENOSCOPY (EGD) WITH PROPOFOL N/A 10/02/2019   Procedure: ESOPHAGOGASTRODUODENOSCOPY (EGD) WITH PROPOFOL with Dilation;  Surgeon: [name], MD;  Location: [facility] ENDOSCOPY;  Service: Gastroenterology;  Laterality: N/A;   ESOPHAGOGASTRODUODENOSCOPY (EGD) WITH PROPOFOL N/A 11/04/2019   Procedure: ESOPHAGOGASTRODUODENOSCOPY (EGD) WITH PROPOFOL;  Surgeon: [name], MD;  Location[facility] ENDOSCOPY;  Service: Gastroenterology;  Laterality: N/A;   ESOPHAGOGASTRODUODENOSCOPY (EGD) WITH PROPOFOL N/A 02/05/2020   Procedure: ESOPHAGOGASTRODUODENOSCOPY (EGD) WITH PROPOFOL;  Surgeon: [name], MD;  Location: [facility] ENDOSCOPY;  Service: Gastroenterology;  Laterality: N/A;   ESOPHAGOGASTRODUODENOSCOPY (EGD) WITH PROPOFOL N/A 02/19/2020   Procedure: ESOPHAGOGASTRODUODENOSCOPY (EGD) WITH PROPOFOL;  Surgeon: [name], MD;  Location: [facility] ENDOSCOPY;  Service: Gastroenterology;  Laterality: N/A;   ESOPHAGOGASTRODUODENOSCOPY (EGD) WITH PROPOFOL N/A 03/10/2020   Procedure: ESOPHAGOGASTRODUODENOSCOPY (EGD) WITH PROPOFOL;  Surgeon: [name], MD;  Location: [facility] ENDOSCOPY;  Service: Gastroenterology;  Laterality: N/A;   ESOPHAGOGASTRODUODENOSCOPY (EGD) WITH PROPOFOL N/A 03/31/2020   Procedure: ESOPHAGOGASTRODUODENOSCOPY (EGD) WITH PROPOFOL;  Surgeon: [name], MD;  Location: [facility] ENDOSCOPY;  Service: Gastroenterology;  Laterality: N/A;  7:30 AM PROCEDURE PER DR ANNA C-19 TEST ON 03/30/2020 AM   ESOPHAGOGASTRODUODENOSCOPY (EGD) WITH PROPOFOL N/A 04/22/2020   Procedure: ESOPHAGOGASTRODUODENOSCOPY (EGD) WITH PROPOFOL;  Surgeon: [name], MD;  Location: [facility] ENDOSCOPY;  Service: Gastroenterology;  Laterality: N/A;   ESOPHAGOGASTRODUODENOSCOPY (EGD) WITH PROPOFOL N/A 05/26/2020   Procedure: ESOPHAGOGASTRODUODENOSCOPY (EGD) WITH PROPOFOL;   Surgeon: [name], MD;  Location: [facility] ENDOSCOPY;  Service: Gastroenterology;  Laterality: N/A;   EYE SURGERY     retinal detatchment   FRACTURE SURGERY     radical prostate     THORACIC LAMINECTOMY FOR EPIDURAL ABSCESS Bilateral 07/02/2021   Procedure: THORACIC LAMINECTOMY FOR EPIDURAL ABSCESS;  Surgeon: Meade Maw, MD;  Location: [facility] ORS;  Service: Neurosurgery;  Laterality: Bilateral;   TONSILLECTOMY      Current Medications: Current Meds  Medication Sig   apixaban (ELIQUIS) 5 MG TABS tablet Take 1 tablet (5 mg total) by mouth 2 (two) times daily.   atorvastatin (LIPITOR) 40 MG tablet Take 1 tablet (40 mg total) by mouth daily at 6 PM.   famciclovir (FAMVIR) 250 MG tablet Take 250 mg by mouth 2 (two) times daily.   famotidine (PEPCID) 20 MG tablet Take 1 tablet by mouth at bedtime.   losartan (COZAAR) 100 MG tablet Take 1 tablet (100 mg total) by mouth daily.   metoprolol tartrate (LOPRESSOR) 25 MG tablet Take 25 mg by mouth 2 (two) times daily.   Multiple Vitamins-Minerals (MULTIVITAMIN ADULT) CHEW Chew 2 tablets by mouth every morning.   omeprazole (PRILOSEC) 20 MG capsule Take 20 mg by mouth daily.   traZODone (DESYREL) 50 MG tablet Take 0.5-1 tablets (25-50 mg total) by mouth at bedtime as needed for sleep.     Allergies:   Hydralazine   Social History   Socioeconomic History   Marital status: Married    Spouse name: Not on file   Number of children: Not on file   Years of education: Not on file   Highest education level: Not on file  Occupational History   Not on file  Tobacco Use   Smoking status: Former   Smokeless tobacco: Never  Vaping Use   Vaping Use: Never used  Substance and Sexual Activity   Alcohol use: Not Currently   Drug use: Never   Sexual activity: Not on file  Other Topics  Concern   Not on file  Social History Narrative   Lives at home with wife   Social Determinants of Health   Financial Resource Strain: Not on file  Food  Insecurity: Not on file  Transportation Needs: Not on file  Physical Activity: Not on file  Stress: Not on file  Social Connections: Not on file     Family History: The patient's family history is not on file.  ROS:   Please see the history of present illness.     All other systems reviewed and are negative.  EKGs/Labs/Other Studies Reviewed:    The following studies were reviewed today:   EKG:  EKG not ordered today.   Recent Labs: 07/07/2021: Magnesium 2.0 07/09/2021: TSH 2.706 09/29/2021: ALT 30; BUN 20; Creat 0.62; Hemoglobin 10.5; Platelets 400; Potassium 4.3; Sodium 137  Recent Lipid Panel    Component Value Date/Time   CHOL 206 (H) 09/18/2018 0511   TRIG 132 09/18/2018 0511   HDL 31 (L) 09/18/2018 0511   CHOLHDL 6.6 09/18/2018 0511   VLDL 26 09/18/2018 0511   LDLCALC 149 (H) 09/18/2018 0511     Risk Assessment/Calculations:         Physical Exam:    VS:  BP (!) 148/84 (BP Location: Left Arm, Patient Position: Sitting, Cuff Size: Normal)   Pulse 67   Wt 181 lb (82.1 kg)   SpO2 98%   BMI 25.24 kg/m     Wt Readings from Last 3 Encounters:  12/21/21 181 lb (82.1 kg)  11/24/21 179 lb 12.8 oz (81.6 kg)  11/02/21 178 lb 12.8 oz (81.1 kg)     GEN:  Well nourished, well developed in no acute distress HEENT: Normal NECK: No JVD; No carotid bruits CARDIAC: RRR, no murmurs, rubs, gallops RESPIRATORY:  Clear to auscultation without rales, wheezing or rhonchi  ABDOMEN: Soft, non-tender, non-distended MUSCULOSKELETAL:  No edema; No deformity  SKIN: Warm and dry NEUROLOGIC:  Alert and oriented x 3 PSYCHIATRIC:  Normal affect   ASSESSMENT:    1. Paroxysmal atrial fibrillation (HCC)   2. Primary hypertension   3. Pure hypercholesterolemia   4. Ascending aorta dilation (HCC)    PLAN:    In order of problems listed above:  Paroxysmal atrial fibrillation, CHA2DS2-VASc of 4.  A-fib occurred in the context of esophagectomy postop.  Cardiac monitor showed  paroxysmal SVTs, no evidence for A-fib.  While this may be a lone event, patient had a stroke 3 years ago.  Continue Lopressor, Eliquis.  Echo was normal.  Keep appointment with A-fib clinic.  Amiodarone previously stopped.  Due to history of CVA, wondering if ILR will be beneficial. Hypertension, BP elevated today, usually controlled.  Did not take BP meds this a.m.  Losartan 100 mg, Lopressor. Hyperlipidemia, continue Lipitor 40 mg daily Mild ascending aorta dilatation, measuring 4.3 cm on chest CT 06/2021.  Continue serial monitoring yearly.  Follow-up in 3 months.     Medication Adjustments/Labs and Tests Ordered: Current medicines are reviewed at length with the patient today.  Concerns regarding medicines are outlined above.  No orders of the defined types were placed in this encounter.  No orders of the defined types were placed in this encounter.   Patient Instructions  Medication Instructions:   STOP AMIODARONE   *If you need a refill on your cardiac medications before your next appointment, please call your pharmacy*   Lab Work:  None Ordered  If you have labs (blood work) drawn today and your  tests are completely normal, you will receive your results only by: MyChart Message (if you have MyChart) OR A paper copy in the mail If you have any lab test that is abnormal or we need to change your treatment, we will call you to review the results.   Testing/Procedures:  1. none Ordered   Follow-Up: At [facility], you and your health needs are our priority.  As part of our continuing mission to provide you with exceptional heart care, we have created designated Provider Care Teams.  These Care Teams include your primary Cardiologist (physician) and Advanced Practice Providers (APPs -  Physician Assistants and Nurse Practitioners) who all work together to provide you with the care you need, when you need it.  We recommend signing up for the patient portal called  "MyChart".  Sign up information is provided on this After Visit Summary.  MyChart is used to connect with patients for Virtual Visits (Telemedicine).  Patients are able to view lab/test results, encounter notes, upcoming appointments, etc.  Non-urgent messages can be sent to your provider as well.   To learn more about what you can do with MyChart, go to [email or web address].    Your next appointment:   3 month(s)  The format for your next appointment:   In Person  Provider:   You may see Kate Sable, MD or one of the following Advanced Practice Providers on your designated Care Team:   Murray Hodgkins, NP Christell Faith, PA-C Cadence Kathlen Mody, PA-C Gerrie Nordmann, NP       Signed, Kate Sable, MD  12/21/2021 12:42 PM    [place]

## 2021-12-21 NOTE — Patient Instructions (Signed)
Medication Instructions:   STOP AMIODARONE   *If you need a refill on your cardiac medications before your next appointment, please call your pharmacy*   Lab Work:  None Ordered  If you have labs (blood work) drawn today and your tests are completely normal, you will receive your results only by: [place] (if you have MyChart) OR A paper copy in the mail If you have any lab test that is abnormal or we need to change your treatment, we will call you to review the results.   Testing/Procedures:  1. none Ordered   Follow-Up: At [facility], you and your health needs are our priority.  As part of our continuing mission to provide you with exceptional heart care, we have created designated Provider Care Teams.  These Care Teams include your primary Cardiologist (physician) and Advanced Practice Providers (APPs -  Physician Assistants and Nurse Practitioners) who all work together to provide you with the care you need, when you need it.  We recommend signing up for the patient portal called "MyChart".  Sign up information is provided on this After Visit Summary.  MyChart is used to connect with patients for Virtual Visits (Telemedicine).  Patients are able to view lab/test results, encounter notes, upcoming appointments, etc.  Non-urgent messages can be sent to your provider as well.   To learn more about what you can do with MyChart, go to [email or web address].    Your next appointment:   3 month(s)  The format for your next appointment:   In Person  Provider:   You may see Kate Sable, MD or one of the following Advanced Practice Providers on your designated Care Team:   Murray Hodgkins, NP Christell Faith, PA-C Cadence Kathlen Mody, PA-C Gerrie Nordmann, NP

## 2021-12-21 NOTE — Telephone Encounter (Signed)
Pt called stating he has a rectal problem and he has been in pain for three days.  Pt thinks its a growth. Sent to access nurse

## 2021-12-22 ENCOUNTER — Encounter: Payer: Self-pay | Admitting: Nurse Practitioner

## 2021-12-22 ENCOUNTER — Ambulatory Visit (INDEPENDENT_AMBULATORY_CARE_PROVIDER_SITE_OTHER): Payer: Medicare Other | Admitting: Nurse Practitioner

## 2021-12-22 ENCOUNTER — Telehealth: Payer: Self-pay

## 2021-12-22 VITALS — BP 138/82 | HR 70 | Temp 97.6°F | Ht 71.0 in | Wt 180.0 lb

## 2021-12-22 DIAGNOSIS — K645 Perianal venous thrombosis: Secondary | ICD-10-CM | POA: Diagnosis not present

## 2021-12-22 MED ORDER — HYDROCORTISONE (PERIANAL) 2.5 % EX CREA: 1.0000 | TOPICAL_CREAM | Freq: Two times a day (BID) | CUTANEOUS | 0 refills | Status: DC

## 2021-12-22 NOTE — Telephone Encounter (Signed)
Access nurse note sent to PCP

## 2021-12-22 NOTE — Assessment & Plan Note (Signed)
Thrombosed hemorrhoid on exam.  Will write for the following 2.5% external cream to help with size reduction.  Patient can use witch hazel wipes over-the-counter for as needed relief.  Ambulatory referral to surgery placed today.  Encourage patient to make sure stool soft by consuming plenty of fluid and fiber.  If he needs to get over-the-counter stool softeners

## 2021-12-22 NOTE — Telephone Encounter (Signed)
Pt was sent to Access nurse due to this phone note below:    Andrew Mullins routed conversation to Andrew Mullins (9:37 AM)   Andrew Mullins (9:37 AM)   Andrew Mullins Pt called stating he has a rectal problem and he has been in pain for three days.  Pt thinks its a growth. Sent to access nurse      Note   Andrew Mullins, Andrew Mullins [phone]  Andrew Knack DYesterday (9:35 AM)    Pt was advised to see pcp withing 24 hrs pt was scheduled with Andrew Mullins in [facility] on today. See access nurse note below:

## 2021-12-22 NOTE — Telephone Encounter (Signed)
Noted. He is scheduled to be seen.

## 2021-12-22 NOTE — Patient Instructions (Signed)
Nice tosee you today You can use the prescription cream twice a day for a week You can get over the counter witch hazel wipes for added comfort.  I have placed a referral for surgery, they will reach out to you

## 2021-12-22 NOTE — Progress Notes (Signed)
Acute Office Visit  Subjective:     Patient ID: Andrew Mullins, male    DOB: 02-Jan-1956, 66 y.o.   MRN: [id number]  Chief Complaint  Patient presents with   Rectal Pain    Notice Mullins growth about 3 days ago    HPI Patient is in today for rectal pain  States that he noticed it approx 3 days go Last colonoscopy was 11/11/2020 did show Mullins 3 mm polyp and diverticulosis. Recently under went upper Endoscopy on 11/02/2021  States that it started hurting and he ignored it the first day. States that it has been presistent. States tha twhen he was in the shower and noticed Mullins mass on the inside of his rectum. Painful to the [place]. States that when he has Mullins bm does not hurt. States cleaning hisself will irratate it. No blood or itching. Never had Mullins hemmorhoid  States dx esop [facility] in 2010. States that he did the highest dose of treatmetn. Has hd 89 endoscopys. States that he had an esphojectomy and 4 dilations  Review of Systems  Constitutional:  Negative for fever.  Respiratory:  Negative for shortness of breath.   Cardiovascular:  Negative for chest pain.  Gastrointestinal:  Negative for abdominal pain, constipation, diarrhea, nausea and vomiting.       Mass in rectum         Objective:    BP 138/82 (BP Location: Left Arm, Patient Position: Sitting)   Pulse 70   Temp 97.6 F (36.4 C) (Skin)   Ht '5\' 11"'$  (1.803 m)   Wt 180 lb (81.6 kg)   SpO2 98%   BMI 25.10 kg/m    Physical Exam Vitals and nursing note reviewed. Exam conducted with Mullins chaperone present [employer], LPN).  Constitutional:      Appearance: Normal appearance.  Cardiovascular:     Rate and Rhythm: Normal rate and regular rhythm.     Heart sounds: Normal heart sounds.  Pulmonary:     Effort: Pulmonary effort is normal.     Breath sounds: Normal breath sounds.  Abdominal:     General: Bowel sounds are normal. There is no distension.     Palpations: There is no mass.     Tenderness: There is no abdominal  tenderness.     Hernia: No hernia is present.  Genitourinary:   Neurological:     Mental Status: He is alert.     No results found for any visits on 12/22/21.      Assessment & Plan:   Problem List Items Addressed This Visit       Cardiovascular and Mediastinum   External thrombosed hemorrhoids - Primary    Thrombosed hemorrhoid on exam.  Will write for the following 2.5% external cream to help with size reduction.  Patient can use witch hazel wipes over-the-counter for as needed relief.  Ambulatory referral to surgery placed today.  Encourage patient to make sure stool soft by consuming plenty of fluid and fiber.  If he needs to get over-the-counter stool softeners      Relevant Medications   hydrocortisone (PROCTO-MED HC) 2.5 % rectal cream   Other Relevant Orders   Ambulatory referral to General Surgery    Meds ordered this encounter  Medications   hydrocortisone (PROCTO-MED HC) 2.5 % rectal cream    Sig: Place 1 Application rectally 2 (two) times daily.    Dispense:  30 g    Refill:  0    Order Specific Question:  Supervising Provider    Answer:   Andrew Mullins [[id number]]    Return if symptoms worsen or fail to improve.  Romilda Garret, NP

## 2021-12-25 ENCOUNTER — Encounter: Payer: Self-pay | Admitting: Surgery

## 2021-12-25 ENCOUNTER — Ambulatory Visit (INDEPENDENT_AMBULATORY_CARE_PROVIDER_SITE_OTHER): Payer: Medicare Other | Admitting: Surgery

## 2021-12-25 ENCOUNTER — Other Ambulatory Visit: Payer: Self-pay

## 2021-12-25 VITALS — BP 158/87 | HR 73 | Temp 97.9°F | Ht 71.0 in | Wt 179.8 lb

## 2021-12-25 DIAGNOSIS — K645 Perianal venous thrombosis: Secondary | ICD-10-CM

## 2021-12-25 MED ORDER — LIDOCAINE 5 % EX OINT: 1.0000 | TOPICAL_OINTMENT | CUTANEOUS | 0 refills | Status: DC | PRN

## 2021-12-25 NOTE — Patient Instructions (Addendum)
Take Miralx or Colace for constipation.   Constipation, Adult Constipation is when a person has fewer than three bowel movements in a week, has difficulty having a bowel movement, or has stools (feces) that are dry, hard, or larger than normal. Constipation may be caused by an underlying condition. It may become worse with age if a person takes certain medicines and does not take in enough fluids. Follow these instructions at home: Eating and drinking  Eat foods that have a lot of fiber, such as beans, whole grains, and fresh fruits and vegetables. Limit foods that are low in fiber and high in fat and processed sugars, such as fried or sweet foods. These include french fries, hamburgers, cookies, candies, and soda. Drink enough fluid to keep your urine pale yellow. General instructions Exercise regularly or as told by your health care provider. Try to do 150 minutes of moderate exercise each week. Use the bathroom when you have the urge to go. Do not hold it in. Take over-the-counter and prescription medicines only as told by your health care provider. This includes any fiber supplements. During bowel movements: Practice deep breathing while relaxing the lower abdomen. Practice pelvic floor relaxation. Watch your condition for any changes. Let your health care provider know about them. Keep all follow-up visits as told by your health care provider. This is important. Contact a health care provider if: You have pain that gets worse. You have a fever. You do not have a bowel movement after 4 days. You vomit. You are not hungry or you lose weight. You are bleeding from the opening between the buttocks (anus). You have thin, pencil-like stools. Get help right away if: You have a fever and your symptoms suddenly get worse. You leak stool or have blood in your stool. Your abdomen is bloated. You have severe pain in your abdomen. You feel dizzy or you faint. Summary Constipation is when a  person has fewer than three bowel movements in a week, has difficulty having a bowel movement, or has stools (feces) that are dry, hard, or larger than normal. Eat foods that have a lot of fiber, such as beans, whole grains, and fresh fruits and vegetables. Drink enough fluid to keep your urine pale yellow. Take over-the-counter and prescription medicines only as told by your health care provider. This includes any fiber supplements. This information is not intended to replace advice given to you by your health care provider. Make sure you discuss any questions you have with your health care provider. Document Revised: 11/19/2018 Document Reviewed: 11/19/2018 Elsevier Patient Education  [place]. How to Take a [employer] A sitz bath is a warm water bath that may be used to care for your rectum, genital area, or the area between your rectum and genitals (perineum). In a sitz bath, the water only comes up to your hips and covers your buttocks. A sitz bath may be done in a bathtub or with a portable sitz bath that fits over the toilet. Your health care provider may recommend a sitz bath to help: Relieve pain and discomfort after delivering a baby. Relieve pain and itching from hemorrhoids or anal fissures. Relieve pain after certain surgeries. Relax muscles that are sore or tight. How to take a sitz bath Take 2-4 sitz baths a day, or as many as told by your health care provider. Bathtub sitz bath To take a sitz bath in a bathtub: Partially fill a bathtub with warm water. The water should be deep  enough to cover your hips and buttocks when you are sitting in the bathtub. Follow your health care provider's instructions if you are told to put medicine in the water. Sit in the water. Open the bathtub drain a little, and leave it open during your bath. Turn on the warm water again, enough to replace the water that is draining out. Keep the water running throughout your bath. This helps keep the  water at the right level and temperature. Soak in the water for 15-20 minutes, or as long as told by your health care provider. When you are done, be careful when you stand up. You may feel dizzy. After the sitz bath, pat yourself dry. Do not rub your skin to dry it.  Over-the-toilet sitz bath To take a sitz bath with an over-the-toilet basin: Follow the manufacturer's instructions. Fill the basin with warm water. Follow your health care provider's instructions if you were told to put medicine in the water. Sit on the seat. Make sure the water covers your buttocks and perineum. Soak in the water for 15-20 minutes, or as long as told by your health care provider. After the sitz bath, pat yourself dry. Do not rub your skin to dry it. Clean and dry the basin between uses. Discard the basin if it cracks, or according to the manufacturer's instructions.  Contact a health care provider if: Your pain or itching gets worse. Stop doing sitz baths if your symptoms get worse. You have new symptoms. Stop doing sitz baths until you talk with your health care provider. Summary A sitz bath is a warm water bath in which the water only comes up to your hips and covers your buttocks. Your health care provider may recommend a sitz bath to help relieve pain and discomfort after delivering a baby, relieve pain and itching from hemorrhoids or anal fissures, relieve pain after certain surgeries, or help to relax muscles that are sore or tight. Take 2-4 sitz baths a day, or as many as told by your health care provider. Soak in the water for 15-20 minutes. Stop doing sitz baths if your symptoms get worse. This information is not intended to replace advice given to you by your health care provider. Make sure you discuss any questions you have with your health care provider. Document Revised: 04/04/2021 Document Reviewed: 04/04/2021 Elsevier Patient Education  [place]. Hemorrhoids Hemorrhoids are swollen  veins in and around the rectum or anus. There are two types of hemorrhoids: Internal hemorrhoids. These occur in the veins that are just inside the rectum. They may poke through to the outside and become irritated and painful. External hemorrhoids. These occur in the veins that are outside the anus and can be felt as a painful swelling or hard lump near the anus. Most hemorrhoids do not cause serious problems, and they can be managed with home treatments such as diet and lifestyle changes. If home treatments do not help the symptoms, procedures can be done to shrink or remove the hemorrhoids. What are the causes? This condition is caused by increased pressure in the anal area. This pressure may result from various things, including: Constipation. Straining to have a bowel movement. Diarrhea. Pregnancy. Obesity. Sitting for long periods of time. Heavy lifting or other activity that causes you to strain. Anal sex. Riding a bike for a long period of time. What are the signs or symptoms? Symptoms of this condition include: Pain. Anal itching or irritation. Rectal bleeding. Leakage of stool (feces). Anal  swelling. One or more lumps around the anus. How is this diagnosed? This condition can often be diagnosed through a visual exam. Other exams or tests may also be done, such as: An exam that involves feeling the rectal area with a gloved hand (digital rectal exam). An exam of the anal canal that is done using a small tube (anoscope). A blood test, if you have lost a significant amount of blood. A test to look inside the colon using a flexible tube with a camera on the end (sigmoidoscopy or colonoscopy). How is this treated? This condition can usually be treated at home. However, various procedures may be done if dietary changes, lifestyle changes, and other home treatments do not help your symptoms. These procedures can help make the hemorrhoids smaller or remove them completely. Some of  these procedures involve surgery, and others do not. Common procedures include: Rubber band ligation. Rubber bands are placed at the base of the hemorrhoids to cut off their blood supply. Sclerotherapy. Medicine is injected into the hemorrhoids to shrink them. Infrared coagulation. A type of light energy is used to get rid of the hemorrhoids. Hemorrhoidectomy surgery. The hemorrhoids are surgically removed, and the veins that supply them are tied off. Stapled hemorrhoidopexy surgery. The surgeon staples the base of the hemorrhoid to the rectal wall. Follow these instructions at home: Eating and drinking  Eat foods that have a lot of fiber in them, such as whole grains, beans, nuts, fruits, and vegetables. Ask your health care provider about taking products that have added fiber (fiber supplements). Reduce the amount of fat in your diet. You can do this by eating low-fat dairy products, eating less red meat, and avoiding processed foods. Drink enough fluid to keep your urine pale yellow. Managing pain and swelling  Take warm sitz baths for 20 minutes, 3-4 times a day to ease pain and discomfort. You may do this in a bathtub or using a portable sitz bath that fits over the toilet. If directed, apply ice to the affected area. Using ice packs between sitz baths may be helpful. Put ice in a plastic bag. Place a towel between your skin and the bag. Leave the ice on for 20 minutes, 2-3 times a day. General instructions Take over-the-counter and prescription medicines only as told by your health care provider. Use medicated creams or suppositories as told. Get regular exercise. Ask your health care provider how much and what kind of exercise is best for you. In general, you should do moderate exercise for at least 30 minutes on most days of the week (150 minutes each week). This can include activities such as walking, biking, or yoga. Go to the bathroom when you have the urge to have a bowel  movement. Do not wait. Avoid straining to have bowel movements. Keep the anal area dry and clean. Use wet toilet paper or moist towelettes after a bowel movement. Do not sit on the toilet for long periods of time. This increases blood pooling and pain. Keep all follow-up visits as told by your health care provider. This is important. Contact a health care provider if you have: Increasing pain and swelling that are not controlled by treatment or medicine. Difficulty having a bowel movement, or you are unable to have a bowel movement. Pain or inflammation outside the area of the hemorrhoids. Get help right away if you have: Uncontrolled bleeding from your rectum. Summary Hemorrhoids are swollen veins in and around the rectum or anus. Most hemorrhoids can  be managed with home treatments such as diet and lifestyle changes. Taking warm sitz baths can help ease pain and discomfort. In severe cases, procedures or surgery can be done to shrink or remove the hemorrhoids. This information is not intended to replace advice given to you by your health care provider. Make sure you discuss any questions you have with your health care provider. Document Revised: 07/12/2020 Document Reviewed: 07/13/2020 Elsevier Patient Education  [place].

## 2021-12-25 NOTE — Progress Notes (Signed)
12/25/2021  Reason for Visit:  Thrombosed hemorrhoid  Requesting Provider:  Karl Ito, NP  History of Present Illness: Andrew Mullins is a 66 y.o. male presenting for evaluation of a thrombosed hemorrhoid.  The patient reports that about a week ago, he started having tenderness in the perianal area.  Then halfway through the week he felt a hard mass/bump in the perianal area when he was showering and called his PCP for an appointment.  He was seen on 12/22/21 and diagnosed with a thrombosed hemorrhoid.  He was referred to [place] for further evaluation.  He was given a prescription for Anusol ointment, but he has not started it yet.  He reports that the pain is doing a bit better, and he's able to manage it better now.  There is tenderness when he wipes, but has not noticed any blood.  This is his first episode of hemorrhoid issues.    He reports having a history of esophageal cancer about 15 years ago which was treated with chemoradiation successfully.  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  He reports that the narcotic pain medication did cause constipation.  He's now recovering and is 2 weeks with more solid food intake and starting to gain weight.    Past Medical History: Past Medical History:  Diagnosis Date   Cancer ([place])    esophageal   Complication of anesthesia    GERD (gastroesophageal reflux disease)    History of CVA (cerebrovascular accident) 10/10/2018   Hypertension    Prostate cancer ([place])    Stroke ([place]) 2020   no wealness     Past Surgical History: Past Surgical History:  Procedure Laterality Date   broken bones repair     COLONOSCOPY WITH PROPOFOL N/A 11/11/2020   Procedure: COLONOSCOPY WITH PROPOFOL;  Surgeon: [name], MD;  Location[facility] ENDOSCOPY;  Service: Gastroenterology;  Laterality: N/A;   ESOPHAGOGASTRODUODENOSCOPY N/A  11/02/2021   Procedure: ESOPHAGOGASTRODUODENOSCOPY (EGD);  Surgeon: Lucilla Lame, MD;  Location: [facility] ENDOSCOPY;  Service: Endoscopy;  Laterality: N/A;   ESOPHAGOGASTRODUODENOSCOPY (EGD) WITH PROPOFOL N/A 02/18/2018   Procedure: ESOPHAGOGASTRODUODENOSCOPY (EGD) WITH PROPOFOL;  Surgeon: [name], MD;  Location: [facility] ENDOSCOPY;  Service: Gastroenterology;  Laterality: N/A;   ESOPHAGOGASTRODUODENOSCOPY (EGD) WITH PROPOFOL N/A 04/01/2018   Procedure: ESOPHAGOGASTRODUODENOSCOPY (EGD) WITH PROPOFOL;  Surgeon: [name], MD;  Location: [facility] ENDOSCOPY;  Service: Gastroenterology;  Laterality: N/A;   ESOPHAGOGASTRODUODENOSCOPY (EGD) WITH PROPOFOL N/A 12/02/2018   Procedure: ESOPHAGOGASTRODUODENOSCOPY (EGD) WITH PROPOFOL with Dilation;  Surgeon: [name], MD;  Location: [facility] ENDOSCOPY;  Service: Gastroenterology;  Laterality: N/A;   ESOPHAGOGASTRODUODENOSCOPY (EGD) WITH PROPOFOL N/A 05/19/2019   Procedure: ESOPHAGOGASTRODUODENOSCOPY (EGD) WITH PROPOFOL;  Surgeon: [name], MD;  Location: [facility] ENDOSCOPY;  Service: Gastroenterology;  Laterality: N/A;   ESOPHAGOGASTRODUODENOSCOPY (EGD) WITH PROPOFOL N/A 05/18/2019   Procedure: ESOPHAGOGASTRODUODENOSCOPY (EGD) WITH PROPOFOL with Dilation;  Surgeon: [name], MD;  Location: [facility] ENDOSCOPY;  Service: Gastroenterology;  Laterality: N/A;   ESOPHAGOGASTRODUODENOSCOPY (EGD) WITH PROPOFOL N/A 06/25/2019   Procedure: ESOPHAGOGASTRODUODENOSCOPY (EGD) WITH PROPOFOL  with Dilation;  Surgeon: [name], MD;  Location: [facility] ENDOSCOPY;  Service: Gastroenterology;  Laterality: N/A;   ESOPHAGOGASTRODUODENOSCOPY (EGD) WITH PROPOFOL N/A 07/09/2019   Procedure: ESOPHAGOGASTRODUODENOSCOPY (EGD) WITH PROPOFOL with Dilation;  Surgeon: [name], MD;  Location: [facility] ENDOSCOPY;  Service: Gastroenterology;  Laterality: N/A;  Pt requests early morning   ESOPHAGOGASTRODUODENOSCOPY (EGD) WITH PROPOFOL N/A 08/11/2019  Procedure: ESOPHAGOGASTRODUODENOSCOPY (EGD) WITH  PROPOFOL with Dilation;  Surgeon: [name], MD;  Location: [facility] ENDOSCOPY;  Service: Gastroenterology;  Laterality: N/A;   ESOPHAGOGASTRODUODENOSCOPY (EGD) WITH PROPOFOL N/A 09/11/2019   Procedure: ESOPHAGOGASTRODUODENOSCOPY (EGD) WITH PROPOFOL with Dilation;  Surgeon: [name], MD;  Location: [facility] ENDOSCOPY;  Service: Gastroenterology;  Laterality: N/A;   ESOPHAGOGASTRODUODENOSCOPY (EGD) WITH PROPOFOL N/A 10/02/2019   Procedure: ESOPHAGOGASTRODUODENOSCOPY (EGD) WITH PROPOFOL with Dilation;  Surgeon: [name], MD;  Location: [facility] ENDOSCOPY;  Service: Gastroenterology;  Laterality: N/A;   ESOPHAGOGASTRODUODENOSCOPY (EGD) WITH PROPOFOL N/A 11/04/2019   Procedure: ESOPHAGOGASTRODUODENOSCOPY (EGD) WITH PROPOFOL;  Surgeon: [name], MD;  Location: [facility] ENDOSCOPY;  Service: Gastroenterology;  Laterality: N/A;   ESOPHAGOGASTRODUODENOSCOPY (EGD) WITH PROPOFOL N/A 02/05/2020   Procedure: ESOPHAGOGASTRODUODENOSCOPY (EGD) WITH PROPOFOL;  Surgeon: [name], MD;  Location: [facility] ENDOSCOPY;  Service: Gastroenterology;  Laterality: N/A;   ESOPHAGOGASTRODUODENOSCOPY (EGD) WITH PROPOFOL N/A 02/19/2020   Procedure: ESOPHAGOGASTRODUODENOSCOPY (EGD) WITH PROPOFOL;  Surgeon: [name], MD;  Location: [facility] ENDOSCOPY;  Service: Gastroenterology;  Laterality: N/A;   ESOPHAGOGASTRODUODENOSCOPY (EGD) WITH PROPOFOL N/A 03/10/2020   Procedure: ESOPHAGOGASTRODUODENOSCOPY (EGD) WITH PROPOFOL;  Surgeon: [name], MD;  Location: [facility] ENDOSCOPY;  Service: Gastroenterology;  Laterality: N/A;   ESOPHAGOGASTRODUODENOSCOPY (EGD) WITH PROPOFOL N/A 03/31/2020   Procedure: ESOPHAGOGASTRODUODENOSCOPY (EGD) WITH PROPOFOL;  Surgeon: [name], MD;  Location: [facility] ENDOSCOPY;  Service: Gastroenterology;  Laterality: N/A;  7:30 AM PROCEDURE PER DR ANNA C-19 TEST ON 03/30/2020 AM   ESOPHAGOGASTRODUODENOSCOPY (EGD) WITH PROPOFOL N/A 04/22/2020   Procedure: ESOPHAGOGASTRODUODENOSCOPY (EGD) WITH PROPOFOL;  Surgeon: [name],  MD;  Location: [facility] ENDOSCOPY;  Service: Gastroenterology;  Laterality: N/A;   ESOPHAGOGASTRODUODENOSCOPY (EGD) WITH PROPOFOL N/A 05/26/2020   Procedure: ESOPHAGOGASTRODUODENOSCOPY (EGD) WITH PROPOFOL;  Surgeon: [name], MD;  Location: [facility] ENDOSCOPY;  Service: Gastroenterology;  Laterality: N/A;   EYE SURGERY     retinal detatchment   FRACTURE SURGERY     radical prostate     THORACIC LAMINECTOMY FOR EPIDURAL ABSCESS Bilateral 07/02/2021   Procedure: THORACIC LAMINECTOMY FOR EPIDURAL ABSCESS;  Surgeon: Meade Maw, MD;  Location: [facility] ORS;  Service: Neurosurgery;  Laterality: Bilateral;   TONSILLECTOMY      Home Medications: Prior to Admission medications   Medication Sig Start Date End Date Taking? Authorizing Provider  apixaban (ELIQUIS) 5 MG TABS tablet Take 1 tablet (5 mg total) by mouth 2 (two) times daily. 12/13/21  Yes Leone Haven, MD  atorvastatin (LIPITOR) 40 MG tablet Take 1 tablet (40 mg total) by mouth daily at 6 PM. 11/10/21  Yes Leone Haven, MD  famciclovir (FAMVIR) 250 MG tablet Take 250 mg by mouth 2 (two) times daily.   Yes [provider]  famotidine (PEPCID) 20 MG tablet Take 1 tablet by mouth at bedtime. 10/24/21 02/21/22 Yes [provider]  hydrocortisone (PROCTO-MED HC) 2.5 % rectal cream Place 1 Application rectally 2 (two) times daily. 12/22/21  Yes Michela Pitcher, NP  losartan (COZAAR) 100 MG tablet TAKE 1 TABLET BY MOUTH DAILY 12/21/21  Yes Leone Haven, MD  metoprolol tartrate (LOPRESSOR) 25 MG tablet Take 25 mg by mouth 2 (two) times daily.   Yes [provider]  Multiple Vitamins-Minerals (MULTIVITAMIN ADULT) CHEW Chew 2 tablets by mouth every morning.   Yes [provider]  omeprazole (PRILOSEC) 20 MG capsule Take 20 mg by mouth daily.   Yes [provider]  traZODone (DESYREL) 50 MG tablet TAKE 1/2 TO 1 TABLET BY MOUTH AT BEDTIME AS NEEDED FOR SLEEP  12/25/21  Yes Leone Haven, MD     Allergies: Allergies  Allergen Reactions   Hydralazine Other (See Comments)    Flushing, anxiety-per [facility] Flushing, anxiety, shaking after procedure.    Social History:  reports that he has quit smoking. He has never used smokeless tobacco. He reports that he does not currently use alcohol. He reports that he does not use drugs.   Family History: History reviewed. No pertinent family history.  Review of Systems: Review of Systems  Constitutional:  Negative for chills and fever.  Respiratory:  Negative for shortness of breath.   Cardiovascular:  Negative for chest pain.  Gastrointestinal:  Negative for abdominal pain, blood in stool, constipation, nausea and vomiting.    Physical Exam BP (!) 158/87   Pulse 73   Temp 97.9 F (36.6 C) (Oral)   Ht '5\' 11"'$  (1.803 m)   Wt 179 lb 12.8 oz (81.6 kg)   SpO2 99%   BMI 25.08 kg/m  CONSTITUTIONAL: No acute distress, well nourished. HEENT:  Normocephalic, atraumatic, extraocular motion intact. RESPIRATORY:  Normal respiratory effort without pathologic use of accessory muscles. CARDIOVASCULAR: Regular rhythm and rate. RECTAL: External exam reveal a mildly enlarged right posterior external hemorrhoid with evidence of recent thrombosis.  The tissue is soft with still some mild bluish discoloration, but overall with less inflammation and softer.  Digital rectal exam reveals some mild enlargement of the internal posterior component as well, no gross blood. MUSCULOSKELETAL:  Normal muscle strength and tone in all four extremities.  No peripheral edema or cyanosis. SKIN: Skin turgor is normal. There are no pathologic skin lesions.  NEUROLOGIC:  Motor and sensation is grossly normal.  Cranial nerves are grossly intact. PSYCH:  Alert and oriented to person, place and time. Affect is normal.  Laboratory Analysis: Labs from 10/09/21: Na 137, K 4.3, Cl 100, CO2 27, BUN 20, Cr 0.62.  LFTs within normal.  WBC 8.8, Hgb 10.5, Hct 31.5, Plt  400  Imaging: No results found.  Assessment and Plan: This is a 66 y.o. male with a thrombosed external hemorrhoid.  --Discussed with the patient that the posterior external hemorrhoid did have thrombosis, but that at this point it's starting to reabsorb and improve.  Based on the timeline when this started, he would not benefit from I&D and evacuation of clot, as this procedure may just cause more inflammation and pain rather than improve things.  For now, discussed with him that he can start with the Anusol ointment twice daily, and we'll add a prescription for Lidocaine 5% gel to use as needed for pain, and he can also do Sitz baths twice daily to help soothe the tissue, and add stool softener or Miralax as needed to help prevent any constipation.  He's in agreement. --Follow up as needed.  Return precautions given.  I spent 30 minutes dedicated to the care of this patient on the date of this encounter to include pre-visit review of records, face-to-face time with the patient discussing diagnosis and management, and any post-visit coordination of care.   Melvyn Neth, [facility]

## 2022-01-19 ENCOUNTER — Other Ambulatory Visit: Payer: Self-pay

## 2022-01-19 DIAGNOSIS — I4891 Unspecified atrial fibrillation: Secondary | ICD-10-CM

## 2022-01-19 MED ORDER — APIXABAN 5 MG PO TABS: 5.0000 mg | ORAL_TABLET | Freq: Two times a day (BID) | ORAL | 1 refills | Status: DC

## 2022-02-13 ENCOUNTER — Other Ambulatory Visit: Payer: Self-pay | Admitting: Family Medicine

## 2022-02-13 ENCOUNTER — Other Ambulatory Visit: Payer: Self-pay

## 2022-02-13 DIAGNOSIS — E785 Hyperlipidemia, unspecified: Secondary | ICD-10-CM

## 2022-02-13 DIAGNOSIS — I10 Essential (primary) hypertension: Secondary | ICD-10-CM

## 2022-02-13 NOTE — Progress Notes (Deleted)
Electrophysiology Office Note:    Date:  02/13/2022   ID:  Andrew Mullins, DOB May 12, 1955, MRN [id number]  PCP:  Leone Haven, MD  [facility] Cardiologist:  Kate Sable, MD  [facility] Electrophysiologist:  Vickie Epley, MD   Referring MD: Leone Haven, MD   Chief Complaint: Atrial fibrillation  History of Present Illness:    Andrew Mullins is a 67 y.o. male who presents for an evaluation of atrial fibrillation at the request of Dr. Garen Lah. Their medical history includes esophageal cancer, GERD, history of stroke, hypertension.  The patient last saw Dr. Garen Lah December 21, 2021.  He is on Eliquis for stroke prophylaxis.  He was previously on amiodarone but this was stopped.  It seems that his atrial fibrillation was thought to be related to his esophagectomy.  He was referred to consider loop recorder implant for atrial fibrillation surveillance.     Past Medical History:  Diagnosis Date   Cancer [facility])    esophageal   Complication of anesthesia    GERD (gastroesophageal reflux disease)    History of CVA (cerebrovascular accident) 10/10/2018   Hypertension    Prostate cancer ([place])    Stroke ([place]) 2020   no wealness    Past Surgical History:  Procedure Laterality Date   broken bones repair     COLONOSCOPY WITH PROPOFOL N/A 11/11/2020   Procedure: COLONOSCOPY WITH PROPOFOL;  Surgeon: [name], MD;  Location: [facility] ENDOSCOPY;  Service: Gastroenterology;  Laterality: N/A;   ESOPHAGECTOMY  09/08/2021   three incision esophagectomy with gastric conduit and feeding J tube   ESOPHAGOGASTRODUODENOSCOPY N/A 11/02/2021   Procedure: ESOPHAGOGASTRODUODENOSCOPY (EGD);  Surgeon: Lucilla Lame, MD;  Location: [facility] ENDOSCOPY;  Service: Endoscopy;  Laterality: N/A;   ESOPHAGOGASTRODUODENOSCOPY (EGD) WITH PROPOFOL N/A 02/18/2018   Procedure: ESOPHAGOGASTRODUODENOSCOPY (EGD) WITH PROPOFOL;  Surgeon: [name], MD;  Location: [facility] ENDOSCOPY;   Service: Gastroenterology;  Laterality: N/A;   ESOPHAGOGASTRODUODENOSCOPY (EGD) WITH PROPOFOL N/A 04/01/2018   Procedure: ESOPHAGOGASTRODUODENOSCOPY (EGD) WITH PROPOFOL;  Surgeon: [name], MD;  Location: [facility] ENDOSCOPY;  Service: Gastroenterology;  Laterality: N/A;   ESOPHAGOGASTRODUODENOSCOPY (EGD) WITH PROPOFOL N/A 12/02/2018   Procedure: ESOPHAGOGASTRODUODENOSCOPY (EGD) WITH PROPOFOL with Dilation;  Surgeon: [name], MD;  Location: [facility] ENDOSCOPY;  Service: Gastroenterology;  Laterality: N/A;   ESOPHAGOGASTRODUODENOSCOPY (EGD) WITH PROPOFOL N/A 05/19/2019   Procedure: ESOPHAGOGASTRODUODENOSCOPY (EGD) WITH PROPOFOL;  Surgeon: [name], MD;  Location: [facility] ENDOSCOPY;  Service: Gastroenterology;  Laterality: N/A;   ESOPHAGOGASTRODUODENOSCOPY (EGD) WITH PROPOFOL N/A 05/18/2019   Procedure: ESOPHAGOGASTRODUODENOSCOPY (EGD) WITH PROPOFOL with Dilation;  Surgeon: [name], MD;  Location: [facility] ENDOSCOPY;  Service: Gastroenterology;  Laterality: N/A;   ESOPHAGOGASTRODUODENOSCOPY (EGD) WITH PROPOFOL N/A 06/25/2019   Procedure: ESOPHAGOGASTRODUODENOSCOPY (EGD) WITH PROPOFOL  with Dilation;  Surgeon: [name], MD;  Location: [facility] ENDOSCOPY;  Service: Gastroenterology;  Laterality: N/A;   ESOPHAGOGASTRODUODENOSCOPY (EGD) WITH PROPOFOL N/A 07/09/2019   Procedure: ESOPHAGOGASTRODUODENOSCOPY (EGD) WITH PROPOFOL with Dilation;  Surgeon: [name], MD;  Location: [facility] ENDOSCOPY;  Service: Gastroenterology;  Laterality: N/A;  Pt requests early morning   ESOPHAGOGASTRODUODENOSCOPY (EGD) WITH PROPOFOL N/A 08/11/2019   Procedure: ESOPHAGOGASTRODUODENOSCOPY (EGD) WITH PROPOFOL with Dilation;  Surgeon: [name], MD;  Location: [facility] ENDOSCOPY;  Service: Gastroenterology;  Laterality: N/A;   ESOPHAGOGASTRODUODENOSCOPY (EGD) WITH PROPOFOL N/A 09/11/2019   Procedure: ESOPHAGOGASTRODUODENOSCOPY (EGD) WITH PROPOFOL with Dilation;  Surgeon: [name], MD;  Location: [facility] ENDOSCOPY;  Service:  Gastroenterology;  Laterality: N/A;   ESOPHAGOGASTRODUODENOSCOPY (EGD) WITH PROPOFOL N/A 10/02/2019  Procedure: ESOPHAGOGASTRODUODENOSCOPY (EGD) WITH PROPOFOL with Dilation;  Surgeon: [name], MD;  Location: [facility] ENDOSCOPY;  Service: Gastroenterology;  Laterality: N/A;   ESOPHAGOGASTRODUODENOSCOPY (EGD) WITH PROPOFOL N/A 11/04/2019   Procedure: ESOPHAGOGASTRODUODENOSCOPY (EGD) WITH PROPOFOL;  Surgeon: [name], MD;  Location: [facility] ENDOSCOPY;  Service: Gastroenterology;  Laterality: N/A;   ESOPHAGOGASTRODUODENOSCOPY (EGD) WITH PROPOFOL N/A 02/05/2020   Procedure: ESOPHAGOGASTRODUODENOSCOPY (EGD) WITH PROPOFOL;  Surgeon: [name], MD;  Location: [facility] ENDOSCOPY;  Service: Gastroenterology;  Laterality: N/A;   ESOPHAGOGASTRODUODENOSCOPY (EGD) WITH PROPOFOL N/A 02/19/2020   Procedure: ESOPHAGOGASTRODUODENOSCOPY (EGD) WITH PROPOFOL;  Surgeon: [name], MD;  Location: [facility] ENDOSCOPY;  Service: Gastroenterology;  Laterality: N/A;   ESOPHAGOGASTRODUODENOSCOPY (EGD) WITH PROPOFOL N/A 03/10/2020   Procedure: ESOPHAGOGASTRODUODENOSCOPY (EGD) WITH PROPOFOL;  Surgeon: [name], MD;  Location: [facility] ENDOSCOPY;  Service: Gastroenterology;  Laterality: N/A;   ESOPHAGOGASTRODUODENOSCOPY (EGD) WITH PROPOFOL N/A 03/31/2020   Procedure: ESOPHAGOGASTRODUODENOSCOPY (EGD) WITH PROPOFOL;  Surgeon: [name], MD;  Location: [facility] ENDOSCOPY;  Service: Gastroenterology;  Laterality: N/A;  7:30 AM PROCEDURE PER DR ANNA C-19 TEST ON 03/30/2020 AM   ESOPHAGOGASTRODUODENOSCOPY (EGD) WITH PROPOFOL N/A 04/22/2020   Procedure: ESOPHAGOGASTRODUODENOSCOPY (EGD) WITH PROPOFOL;  Surgeon: [name], MD;  Location: [facility] ENDOSCOPY;  Service: Gastroenterology;  Laterality: N/A;   ESOPHAGOGASTRODUODENOSCOPY (EGD) WITH PROPOFOL N/A 05/26/2020   Procedure: ESOPHAGOGASTRODUODENOSCOPY (EGD) WITH PROPOFOL;  Surgeon: [name], MD;  Location: [facility] ENDOSCOPY;  Service: Gastroenterology;  Laterality: N/A;   EYE SURGERY      retinal detatchment   FRACTURE SURGERY     radical prostate     THORACIC LAMINECTOMY FOR EPIDURAL ABSCESS Bilateral 07/02/2021   Procedure: THORACIC LAMINECTOMY FOR EPIDURAL ABSCESS;  Surgeon: Meade Maw, MD;  Location: [facility] ORS;  Service: Neurosurgery;  Laterality: Bilateral;   TONSILLECTOMY      Current Medications: No outpatient medications have been marked as taking for the 02/14/22 encounter (Appointment) with Vickie Epley, MD.     Allergies:   Hydralazine   Social History   Socioeconomic History   Marital status: Married    Spouse name: Not on file   Number of children: Not on file   Years of education: Not on file   Highest education level: Not on file  Occupational History   Not on file  Tobacco Use   Smoking status: Former   Smokeless tobacco: Never  Vaping Use   Vaping Use: Never used  Substance and Sexual Activity   Alcohol use: Not Currently   Drug use: Never   Sexual activity: Not on file  Other Topics Concern   Not on file  Social History Narrative   Lives at home with wife   Social Determinants of Health   Financial Resource Strain: Not on file  Food Insecurity: Not on file  Transportation Needs: Not on file  Physical Activity: Not on file  Stress: Not on file  Social Connections: Not on file     Family History: The patient's family history is not on file.  ROS:   Please see the history of present illness.    All other systems reviewed and are negative.  EKGs/Labs/Other Studies Reviewed:    The following studies were reviewed today:  November 28, 2021 ZIO monitor No evidence of atrial fibrillation      Recent Labs: 07/07/2021: Magnesium 2.0 07/09/2021: TSH 2.706 09/29/2021: ALT 30; BUN 20; Creat 0.62; Hemoglobin 10.5; Platelets 400; Potassium 4.3; Sodium 137  Recent Lipid Panel    Component Value Date/Time   CHOL 206 (H) 09/18/2018 [id number]  TRIG 132 09/18/2018 0511   HDL 31 (L) 09/18/2018 0511   CHOLHDL 6.6 09/18/2018  0511   VLDL 26 09/18/2018 0511   LDLCALC 149 (H) 09/18/2018 0511    Physical Exam:    VS:  There were no vitals taken for this visit.    Wt Readings from Last 3 Encounters:  12/25/21 179 lb 12.8 oz (81.6 kg)  12/22/21 180 lb (81.6 kg)  12/21/21 181 lb (82.1 kg)     GEN: *** Well nourished, well developed in no acute distress CARDIAC: ***RRR, no murmurs, rubs, gallops RESPIRATORY:  Clear to auscultation without rales, wheezing or rhonchi  PSYCHIATRIC:  Normal affect       ASSESSMENT:    No diagnosis found. PLAN:    In order of problems listed above:  #History of paroxysmal atrial fibrillation On Eliquis for stroke prophylaxis.  Atrial fibrillation originally occurred in the setting of esophagectomy.  No evidence of atrial fibrillation on recent ZIO monitor.  Referred to discuss loop recorder as a mechanism to monitor heart rhythm moving forward.***  #History of stroke High index of suspicion that this could be cardioembolic given previous atrial fibrillation episode.  Continue Eliquis for now.   Medication Adjustments/Labs and Tests Ordered: Current medicines are reviewed at length with the patient today.  Concerns regarding medicines are outlined above.  No orders of the defined types were placed in this encounter.  No orders of the defined types were placed in this encounter.    Signed, Andrew Cork. Quentin Ore, MD, [facility], [facility] 02/13/2022 8:55 PM    Electrophysiology [facility]

## 2022-02-14 ENCOUNTER — Encounter: Payer: Self-pay | Admitting: Cardiology

## 2022-02-14 ENCOUNTER — Ambulatory Visit: Payer: Medicare Other | Attending: Cardiology | Admitting: Cardiology

## 2022-02-14 DIAGNOSIS — I10 Essential (primary) hypertension: Secondary | ICD-10-CM

## 2022-02-14 DIAGNOSIS — I480 Paroxysmal atrial fibrillation: Secondary | ICD-10-CM

## 2022-02-26 ENCOUNTER — Ambulatory Visit: Payer: Medicare Other | Admitting: Family Medicine

## 2022-02-26 VITALS — BP 140/98 | HR 70 | Temp 98.1°F | Ht 71.0 in | Wt 176.8 lb

## 2022-02-26 DIAGNOSIS — I4891 Unspecified atrial fibrillation: Secondary | ICD-10-CM | POA: Diagnosis not present

## 2022-02-26 DIAGNOSIS — A6001 Herpesviral infection of penis: Secondary | ICD-10-CM

## 2022-02-26 DIAGNOSIS — I10 Essential (primary) hypertension: Secondary | ICD-10-CM | POA: Diagnosis not present

## 2022-02-26 DIAGNOSIS — M25551 Pain in right hip: Secondary | ICD-10-CM | POA: Insufficient documentation

## 2022-02-26 DIAGNOSIS — E785 Hyperlipidemia, unspecified: Secondary | ICD-10-CM

## 2022-02-26 DIAGNOSIS — K219 Gastro-esophageal reflux disease without esophagitis: Secondary | ICD-10-CM

## 2022-02-26 DIAGNOSIS — K222 Esophageal obstruction: Secondary | ICD-10-CM

## 2022-02-26 LAB — LIPID PANEL
Cholesterol: 150 mg/dL (ref 0–200)
HDL: 52 mg/dL (ref >39.00)
LDL Cholesterol: 89 mg/dL (ref 0–99)
NonHDL: 97.87
Total CHOL/HDL Ratio: 3
Triglycerides: 46 mg/dL (ref 0.0–149.0)
VLDL: 9.2 mg/dL (ref 0.0–40.0)

## 2022-02-26 LAB — COMPREHENSIVE METABOLIC PANEL
ALT: 10 U/L (ref 0–53)
AST: 12 U/L (ref 0–37)
Albumin: 3.9 g/dL (ref 3.5–5.2)
Alkaline Phosphatase: 68 U/L (ref 39–117)
BUN: 19 mg/dL (ref 6–23)
CO2: 28 mEq/L (ref 19–32)
Calcium: 9.3 mg/dL (ref 8.4–10.5)
Chloride: 103 mEq/L (ref 96–112)
Creatinine, Ser: 0.8 mg/dL (ref 0.40–1.50)
GFR: 92.15 mL/min (ref >60.00)
Glucose, Bld: 97 mg/dL (ref 70–99)
Potassium: 3.9 mEq/L (ref 3.5–5.1)
Sodium: 138 mEq/L (ref 135–145)
Total Bilirubin: 0.5 mg/dL (ref 0.2–1.2)
Total Protein: 6.4 g/dL (ref 6.0–8.3)

## 2022-02-26 MED ORDER — OMEPRAZOLE 20 MG PO CPDR: 20.0000 mg | DELAYED_RELEASE_CAPSULE | Freq: Two times a day (BID) | ORAL | 1 refills | Status: DC

## 2022-02-26 MED ORDER — FAMCICLOVIR 250 MG PO TABS: 250.0000 mg | ORAL_TABLET | Freq: Two times a day (BID) | ORAL | 3 refills | Status: DC

## 2022-02-26 MED ORDER — APIXABAN 5 MG PO TABS: 5.0000 mg | ORAL_TABLET | Freq: Two times a day (BID) | ORAL | 3 refills | Status: DC

## 2022-02-26 MED ORDER — METOPROLOL TARTRATE 25 MG PO TABS: 25.0000 mg | ORAL_TABLET | Freq: Two times a day (BID) | ORAL | 3 refills | Status: DC

## 2022-02-26 NOTE — Assessment & Plan Note (Signed)
Chronic issue.  I will refill famciclovir 250 mg twice daily for the patient.

## 2022-02-26 NOTE — Assessment & Plan Note (Signed)
Possibly related to bursitis though the lack of discomfort on palpation or when laying on the area at night makes this a little less likely.  Discussed if it is not improving with his chiropractic treatments we could have him see a specialist for further evaluation.  I did advise in general I am not a huge fan of chiropractors as I do not think they tend to have long-term solutions for the issues they treat.

## 2022-02-26 NOTE — Patient Instructions (Addendum)
Nice to see you. We will get labs today and contact you with the results.  Please let me know if your hip does not get better and I can get you to see a specialist.  Please start checking your BP at home and keep a list for our next visit.

## 2022-02-26 NOTE — Addendum Note (Signed)
Addended by: Cordelia Pen on: 02/26/2022 10:43 AM   Modules accepted: Orders

## 2022-02-26 NOTE — Assessment & Plan Note (Signed)
Chronic issue.  Above goal.  I will have the patient start to check at home.  Discussed a goal of less than 130/80.  He will return in 6 weeks for follow-up though if his blood pressure is consistently above 130/80 over the next several weeks he will let me know sooner.  He will continue metoprolol 25 mg twice daily, losartan 100 mg daily, and amlodipine 5 mg daily.

## 2022-02-26 NOTE — Assessment & Plan Note (Signed)
Chronic issue.  Continue omeprazole 20 mg twice daily.

## 2022-02-26 NOTE — Assessment & Plan Note (Signed)
Sinus rhythm today.  He will continue Eliquis 5 mg twice daily.  He will continue to follow with cardiology.

## 2022-02-26 NOTE — Assessment & Plan Note (Signed)
Chronic issue.  He will continue to follow with his specialists on this.

## 2022-02-26 NOTE — Progress Notes (Signed)
[name], MD Phone: [phone]  Andrew Mullins is a 67 y.o. male who presents today for f/u.  HYPERTENSION Disease Monitoring Home BP Monitoring not checking Chest pain- no    Dyspnea- no Medications Compliance-  taking losartan, amlodipine, metoprolol.  Edema- chronic in his right ankle due to MSK issues BMET    Component Value Date/Time   NA 137 09/29/2021 1434   K 4.3 09/29/2021 1434   CL 100 09/29/2021 1434   CO2 27 09/29/2021 1434   GLUCOSE 100 (H) 09/29/2021 1434   BUN 20 09/29/2021 1434   CREATININE 0.62 (L) 09/29/2021 1434   CALCIUM 9.0 09/29/2021 1434   GFRNONAA >60 08/17/2021 1257   GFRAA >60 09/17/2018 1201   Esophageal stricture: Patient has an EGD scheduled tomorrow.  He last had an EGD 3.5 weeks ago and notes he has not had any issues since then.  He takes omeprazole 20 mg twice daily and this is beneficial for reflux symptoms.  History of genital herpes: Patient reports he takes famciclovir for this.  He was previously on Valtrex and that stopped working as well.  He notes if he comes off of the medicine for more than a few days he will start to have symptoms.  Back pain/right hip pain: Patient notes he had some back issues recently and he saw a chiropractor.  The back issues have resolved and the pain radiating down his right leg has resolved though he has been having some right lateral hip discomfort which the chiropractor feels may be related to bursitis.  He notes it feels better when he puts pressure on the area at night.  Notes prior to his most recent treatment the pain was a 1-2/10 though after treatment it got up to a 6 out of 10.  He sees a Restaurant manager, fast food again today.  Social History   Tobacco Use  Smoking Status Former  Smokeless Tobacco Never    Current Outpatient Medications on File Prior to Visit  Medication Sig Dispense Refill   amLODipine (NORVASC) 5 MG tablet Take 5 mg by mouth daily.     atorvastatin (LIPITOR) 40 MG tablet TAKE 1  TABLET BY MOUTH DAILY AT 6PM 90 tablet 1   hydrocortisone (PROCTO-MED HC) 2.5 % rectal cream Place 1 Application rectally 2 (two) times daily. 30 g 0   lidocaine (XYLOCAINE) 5 % ointment Apply 1 Application topically as needed. 35.44 g 0   losartan (COZAAR) 100 MG tablet TAKE 1 TABLET BY MOUTH DAILY 90 tablet 1   Multiple Vitamins-Minerals (MULTIVITAMIN ADULT) CHEW Chew 2 tablets by mouth every morning.     traZODone (DESYREL) 50 MG tablet TAKE 1/2 TO 1 TABLET BY MOUTH AT BEDTIME AS NEEDED FOR SLEEP 90 tablet 0   famotidine (PEPCID) 20 MG tablet Take 1 tablet by mouth at bedtime.     No current facility-administered medications on file prior to visit.     ROS see history of present illness  Objective  Physical Exam Vitals:   02/26/22 0946 02/26/22 1017  BP: 136/88 (!) 140/98  Pulse: 70   Temp: 98.1 F (36.7 C)   SpO2: 98%     BP Readings from Last 3 Encounters:  02/26/22 (!) 140/98  12/25/21 (!) 158/87  12/22/21 138/82   Wt Readings from Last 3 Encounters:  02/26/22 176 lb 12.8 oz (80.2 kg)  12/25/21 179 lb 12.8 oz (81.6 kg)  12/22/21 180 lb (81.6 kg)    Physical Exam Constitutional:      General:  He is not in acute distress.    Appearance: He is not diaphoretic.  Cardiovascular:     Rate and Rhythm: Normal rate and regular rhythm.     Heart sounds: Normal heart sounds.  Pulmonary:     Effort: Pulmonary effort is normal.     Breath sounds: Normal breath sounds.  Musculoskeletal:     Comments: Right and left hips with good internal and external range of motion with no discomfort, right hip with no discomfort on palpation of the greater trochanter area  Skin:    General: Skin is warm and dry.  Neurological:     Mental Status: He is alert.      Assessment/Plan: Please see individual problem list.  Essential hypertension Assessment & Plan: Chronic issue.  Above goal.  I will have the patient start to check at home.  Discussed a goal of less than 130/80.  He  will return in 6 weeks for follow-up though if his blood pressure is consistently above 130/80 over the next several weeks he will let me know sooner.  He will continue metoprolol 25 mg twice daily, losartan 100 mg daily, and amlodipine 5 mg daily.   Atrial fibrillation, unspecified type [facility]) Assessment & Plan: Sinus rhythm today.  He will continue Eliquis 5 mg twice daily.  He will continue to follow with cardiology.  Orders: -     Apixaban; Take 1 tablet (5 mg total) by mouth 2 (two) times daily.  Dispense: 180 tablet; Refill: 3 -     Metoprolol Tartrate; Take 1 tablet (25 mg total) by mouth 2 (two) times daily.  Dispense: 180 tablet; Refill: 3  Stricture and stenosis of esophagus Assessment & Plan: Chronic issue.  He will continue to follow with his specialists on this.   Hyperlipidemia, unspecified hyperlipidemia type -     Lipid panel -     Comprehensive metabolic panel  Gastroesophageal reflux disease without esophagitis Assessment & Plan: Chronic issue.  Continue omeprazole 20 mg twice daily.  Orders: -     Omeprazole; Take 1 capsule (20 mg total) by mouth 2 (two) times daily before a meal.  Dispense: 180 capsule; Refill: 1  Right hip pain Assessment & Plan: Possibly related to bursitis though the lack of discomfort on palpation or when laying on the area at night makes this a little less likely.  Discussed if it is not improving with his chiropractic treatments we could have him see a specialist for further evaluation.  I did advise in general I am not a huge fan of chiropractors as I do not think they tend to have long-term solutions for the issues they treat.   Herpes simplex infection of penis Assessment & Plan: Chronic issue.  I will refill famciclovir 250 mg twice daily for the patient.  Orders: -     Famciclovir; Take 1 tablet (250 mg total) by mouth 2 (two) times daily.  Dispense: 180 tablet; Refill: 3    Return in about 6 weeks (around 04/09/2022) for  BP.   [name], MD [place]

## 2022-02-28 ENCOUNTER — Telehealth: Payer: Self-pay | Admitting: Family Medicine

## 2022-02-28 NOTE — Telephone Encounter (Signed)
[facility] called, patient thinks he is suppose to be on amLODipine (NORVASC) 5 MG tablet and they do not have a prescription for that. Please call [phone]

## 2022-03-01 NOTE — Telephone Encounter (Signed)
[facility]weeter called in regarding previous message. As per CMA, we waiting for a response from provider.

## 2022-03-02 ENCOUNTER — Other Ambulatory Visit: Payer: Self-pay

## 2022-03-02 ENCOUNTER — Telehealth: Payer: Self-pay | Admitting: Family Medicine

## 2022-03-02 DIAGNOSIS — I10 Essential (primary) hypertension: Secondary | ICD-10-CM

## 2022-03-02 NOTE — Telephone Encounter (Signed)
Called to inform pt that rx was sent to pharmacy and he stated they already called him to tell him it was ready.

## 2022-03-02 NOTE — Telephone Encounter (Signed)
This was sent in today.

## 2022-03-02 NOTE — Telephone Encounter (Signed)
Pharmacy called stating pt need a refill on amlodipine 58m instead of 165m

## 2022-03-21 ENCOUNTER — Institutional Professional Consult (permissible substitution): Payer: Medicare Other | Admitting: Cardiology

## 2022-03-22 ENCOUNTER — Ambulatory Visit: Payer: Medicare Other | Admitting: Cardiology

## 2022-04-05 ENCOUNTER — Inpatient Hospital Stay
Admission: RE | Admit: 2022-04-05 | Discharge: 2022-04-05 | Disposition: A | Discharge status: Home / Self Care | Payer: Self-pay | Source: Ambulatory Visit | Attending: Neurosurgery | Admitting: Neurosurgery

## 2022-04-05 ENCOUNTER — Other Ambulatory Visit: Payer: Self-pay

## 2022-04-05 ENCOUNTER — Ambulatory Visit (INDEPENDENT_AMBULATORY_CARE_PROVIDER_SITE_OTHER): Payer: Medicare Other | Admitting: Neurosurgery

## 2022-04-05 ENCOUNTER — Encounter: Payer: Self-pay | Admitting: Neurosurgery

## 2022-04-05 VITALS — BP 130/82 | Ht 71.0 in | Wt 176.0 lb

## 2022-04-05 DIAGNOSIS — G8929 Other chronic pain: Secondary | ICD-10-CM

## 2022-04-05 DIAGNOSIS — M546 Pain in thoracic spine: Secondary | ICD-10-CM

## 2022-04-05 DIAGNOSIS — Z049 Encounter for examination and observation for unspecified reason: Secondary | ICD-10-CM

## 2022-04-05 NOTE — Progress Notes (Signed)
   DOS: 07/02/21 (T1-2 laminotomy for epidural abscess)  HISTORY OF PRESENT ILLNESS: 04/05/2022 [name] is 36-month status post thoracic laminotomy for epidural abscess.  He had an esophagectomy and has recovered from that.  He has been having approximate 1 month of pain in between his shoulder blades.  It worsens through the day.  He has no other neurologic symptoms.  He is unable to take NSAIDs.  08/24/2021 [name] is status post laminotomy for epidural abscess.  He is now off antibiotics and is starting to see some improvement in his physical condition from this.  He has had worsening of his esophageal strictures, and will undergo surgery for this on August 25th at [facility].   PHYSICAL EXAMINATION:   Vitals:   04/05/22 0934  BP: 130/82   General: Patient is well developed, well nourished, calm, collected, and in no apparent distress.  NEUROLOGICAL:  General: In no acute distress.  Awake, alert, oriented to person, place, and time. Pupils equal round and reactive to light.   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  He is having continued pain in between his shoulder blades that worsens through the day.  I feel this is likely musculoskeletal.  I would like to start him on physical therapy.  If his pain continues to worsen, I have asked him to contact me and I will begin with imaging with a cervical x-ray to evaluate T1 and T2 in addition to his cervical spine.  May ultimately need an MRI scan.  We did discuss that a nontrivial percentage of patients who have discitis and epidural abscess and up with chronic pain.  I would normally want to treat this with NSAIDs as a for start, but he is unable to take that due to his of esophagectomy.  I will  see him back in 6 weeks. I spent a total of 15 minutes in face-to-face and non-face-to-face activities related to this patient's care today.   Meade Maw MD, [facility] Department of Neurosurgery

## 2022-04-11 ENCOUNTER — Other Ambulatory Visit: Payer: Self-pay | Admitting: Family Medicine

## 2022-04-11 DIAGNOSIS — G479 Sleep disorder, unspecified: Secondary | ICD-10-CM

## 2022-04-16 ENCOUNTER — Ambulatory Visit: Payer: Medicare Other | Admitting: Family Medicine

## 2022-04-17 NOTE — Progress Notes (Unsigned)
  Electrophysiology Office Note:    Date:  04/18/2022   ID:  Andrew Mullins, DOB 02/06/1955, MRN [id number]  [place] Cardiologist:  Kate Sable, MD  [facility] Electrophysiologist:  Vickie Epley, MD   Referring MD: Leone Haven, MD   Chief Complaint: Atrial fibrillation  History of Present Illness:    Andrew Mullins is a 67 y.o. male who I am seeing today for evaluation of atrial fibrillation at the request of Dr. Garen Lah.  The patient has a history of hypertension, paroxysmal atrial fibrillation, hyperlipidemia, stroke, prior tobacco abuse, esophageal cancer postresection.  The patient saw Dr. Garen Lah December 21, 2021.  His previous atrial fibrillation occurred in the context of an endoscopic procedure.  He has been maintained on Eliquis.  The patient did have a stroke 3 years ago.  Dr. Garen Lah referred him to me to discuss possible implantable loop recorder for ongoing rhythm surveillance in an effort to avoid long-term exposure to anticoagulation.  The patient is unaware of any other episodes of atrial fibrillation.  He is interested in avoiding long-term exposure to anticoagulation if at all possible.       Their past medical, social and family history was reveiwed.   ROS:   Please see the history of present illness.    All other systems reviewed and are negative.  EKGs/Labs/Other Studies Reviewed:    The following studies were reviewed today:  November 2023 ZIO monitor showed no evidence of atrial fibrillation.  EKG:  The ekg ordered today demonstrates sinus rhythm.   Physical Exam:    VS:  BP 136/78   Pulse 70   Ht 5\' 11"  (1.803 m)   Wt 175 lb (79.4 kg)   SpO2 96%   BMI 24.41 kg/m     Wt Readings from Last 3 Encounters:  04/18/22 175 lb (79.4 kg)  04/05/22 176 lb (79.8 kg)  02/26/22 176 lb 12.8 oz (80.2 kg)     GEN:  Well nourished, well developed in no acute distress CARDIAC: RRR, no murmurs, rubs,  gallops RESPIRATORY:  Clear to auscultation without rales, wheezing or rhonchi       ASSESSMENT AND PLAN:    1. Paroxysmal atrial fibrillation   2. Primary hypertension     #Paroxysmal atrial fibrillation Appears to have been a lone episode of atrial fibrillation in the setting of esophagectomy.  No recurrence that he is aware of.  He has been maintained on Eliquis but prefers to avoid long-term exposure to anticoagulation if at all possible.  We discussed his stroke risk given his history of prior stroke.  We discussed atrial fibrillation surveillance strategies including loop recorder monitoring. He is interested in proceeding with loop recorder implant.  Plan will be for him to continue his anticoagulant until the loop recorder is implanted.  He then will stop this medication and start aspirin 81 mg by mouth once daily while we are surveilling for A-fib.  I discussed loop recorder monitoring in detail including the risks and monthly monitoring cost and he wishes to proceed.   #Hypertension At goal today.  Recommend checking blood pressures 1-2 times per week at home and recording the values.  Recommend bringing these recordings to the primary care physician.      Signed, Hilton Cork. Quentin Ore, MD, [facility], [facility] 04/18/2022 2:20 PM    Electrophysiology [facility]

## 2022-04-18 ENCOUNTER — Encounter: Payer: Self-pay | Admitting: Cardiology

## 2022-04-18 ENCOUNTER — Ambulatory Visit: Payer: Medicare Other | Attending: Cardiology | Admitting: Cardiology

## 2022-04-18 VITALS — BP 136/78 | HR 70 | Ht 71.0 in | Wt 175.0 lb

## 2022-04-18 DIAGNOSIS — I480 Paroxysmal atrial fibrillation: Secondary | ICD-10-CM | POA: Insufficient documentation

## 2022-04-18 DIAGNOSIS — I10 Essential (primary) hypertension: Secondary | ICD-10-CM | POA: Insufficient documentation

## 2022-04-18 NOTE — Patient Instructions (Signed)
Medication Instructions:  Your physician recommends that you continue on your current medications as directed. Please refer to the Current Medication list given to you today.  *If you need a refill on your cardiac medications before your next appointment, please call your pharmacy*  Testing/Procedures: You will have a loop recorder implanted at your next office visit. There are no restrictions or special instructions prior to this appointment.   Follow-Up: At [facility], you and your health needs are our priority.  As part of our continuing mission to provide you with exceptional heart care, we have created designated Provider Care Teams.  These Care Teams include your primary Cardiologist (physician) and Advanced Practice Providers (APPs -  Physician Assistants and Nurse Practitioners) who all work together to provide you with the care you need, when you need it.  Your next appointment:   Next available for a loop recorder implant  Provider:   Lars Mage, MD

## 2022-04-30 ENCOUNTER — Encounter: Payer: Self-pay | Admitting: Cardiology

## 2022-04-30 ENCOUNTER — Ambulatory Visit: Payer: Medicare Other | Attending: Cardiology | Admitting: Cardiology

## 2022-04-30 VITALS — BP 146/80 | HR 53 | Ht 71.0 in | Wt 173.0 lb

## 2022-04-30 DIAGNOSIS — I77810 Thoracic aortic ectasia: Secondary | ICD-10-CM | POA: Diagnosis present

## 2022-04-30 DIAGNOSIS — I10 Essential (primary) hypertension: Secondary | ICD-10-CM | POA: Diagnosis present

## 2022-04-30 DIAGNOSIS — I480 Paroxysmal atrial fibrillation: Secondary | ICD-10-CM | POA: Insufficient documentation

## 2022-04-30 DIAGNOSIS — E7800 Pure hypercholesterolemia, unspecified: Secondary | ICD-10-CM

## 2022-04-30 NOTE — Progress Notes (Signed)
Cardiology Office Note:    Date:  04/30/2022   ID:  Andrew Mullins, DOB August 01, 1955, MRN [id number]  PCP:  Glori Luis, MD   [facility] Providers Cardiologist:  Debbe Odea, MD Electrophysiologist:  Lanier Prude, MD     Referring MD: Glori Luis, MD   Chief Complaint  Patient presents with   Follow-up    Patient denies new or acute cardiac problems/concerns today.      History of Present Illness:    Andrew Mullins is a 67 y.o. male with a hx of hypertension, paroxysmal atrial fibrillation, hyperlipidemia, CVA 2020, former smoker x20 years, esophageal cancer s/p resection 08/2021 who presents for follow-up.   Had 1 episode of atrial fibrillation after esophageal dilatation procedure.  Has not had any episodes since.  Denies palpitations, has occasional bruising with Eliquis, no significant bleeding.  Evaluated by EP, due to history of CVA, ILR is being planned.  BP adequately controlled at home with systolics in the 120s to 130s.  Has no new concerns at this time.  Prior notes Echocardiogram 06/2021 EF 55 to 60%.  Past Medical History:  Diagnosis Date   Cancer    esophageal   Complication of anesthesia    GERD (gastroesophageal reflux disease)    History of CVA (cerebrovascular accident) 10/10/2018   Hypertension    Prostate cancer    Stroke 2020   no wealness    Past Surgical History:  Procedure Laterality Date   broken bones repair     COLONOSCOPY WITH PROPOFOL N/A 11/11/2020   Procedure: COLONOSCOPY WITH PROPOFOL;  Surgeon: Wyline Mood, MD;  Location: [facility] ENDOSCOPY;  Service: Gastroenterology;  Laterality: N/A;   ESOPHAGECTOMY  09/08/2021   three incision esophagectomy with gastric conduit and feeding J tube   ESOPHAGOGASTRODUODENOSCOPY N/A 11/02/2021   Procedure: ESOPHAGOGASTRODUODENOSCOPY (EGD);  Surgeon: Midge Minium, MD;  Location: [facility] ENDOSCOPY;  Service: Endoscopy;  Laterality: N/A;   ESOPHAGOGASTRODUODENOSCOPY (EGD)  WITH PROPOFOL N/A 02/18/2018   Procedure: ESOPHAGOGASTRODUODENOSCOPY (EGD) WITH PROPOFOL;  Surgeon: Wyline Mood, MD;  Location: [facility] ENDOSCOPY;  Service: Gastroenterology;  Laterality: N/A;   ESOPHAGOGASTRODUODENOSCOPY (EGD) WITH PROPOFOL N/A 04/01/2018   Procedure: ESOPHAGOGASTRODUODENOSCOPY (EGD) WITH PROPOFOL;  Surgeon: Wyline Mood, MD;  Location: [facility] ENDOSCOPY;  Service: Gastroenterology;  Laterality: N/A;   ESOPHAGOGASTRODUODENOSCOPY (EGD) WITH PROPOFOL N/A 12/02/2018   Procedure: ESOPHAGOGASTRODUODENOSCOPY (EGD) WITH PROPOFOL with Dilation;  Surgeon: Wyline Mood, MD;  Location: [facility] ENDOSCOPY;  Service: Gastroenterology;  Laterality: N/A;   ESOPHAGOGASTRODUODENOSCOPY (EGD) WITH PROPOFOL N/A 05/19/2019   Procedure: ESOPHAGOGASTRODUODENOSCOPY (EGD) WITH PROPOFOL;  Surgeon: Wyline Mood, MD;  Location: [facility] ENDOSCOPY;  Service: Gastroenterology;  Laterality: N/A;   ESOPHAGOGASTRODUODENOSCOPY (EGD) WITH PROPOFOL N/A 05/18/2019   Procedure: ESOPHAGOGASTRODUODENOSCOPY (EGD) WITH PROPOFOL with Dilation;  Surgeon: Wyline Mood, MD;  Location: [facility] ENDOSCOPY;  Service: Gastroenterology;  Laterality: N/A;   ESOPHAGOGASTRODUODENOSCOPY (EGD) WITH PROPOFOL N/A 06/25/2019   Procedure: ESOPHAGOGASTRODUODENOSCOPY (EGD) WITH PROPOFOL  with Dilation;  Surgeon: Wyline Mood, MD;  Location: [facility] ENDOSCOPY;  Service: Gastroenterology;  Laterality: N/A;   ESOPHAGOGASTRODUODENOSCOPY (EGD) WITH PROPOFOL N/A 07/09/2019   Procedure: ESOPHAGOGASTRODUODENOSCOPY (EGD) WITH PROPOFOL with Dilation;  Surgeon: Wyline Mood, MD;  Location: [facility] ENDOSCOPY;  Service: Gastroenterology;  Laterality: N/A;  Pt requests early morning   ESOPHAGOGASTRODUODENOSCOPY (EGD) WITH PROPOFOL N/A 08/11/2019   Procedure: ESOPHAGOGASTRODUODENOSCOPY (EGD) WITH PROPOFOL with Dilation;  Surgeon: Wyline Mood, MD;  Location: [facility] ENDOSCOPY;  Service: Gastroenterology;  Laterality: N/A;   ESOPHAGOGASTRODUODENOSCOPY (EGD) WITH PROPOFOL N/A 09/11/2019  Procedure: ESOPHAGOGASTRODUODENOSCOPY (EGD) WITH PROPOFOL with Dilation;  Surgeon: Wyline Mood, MD;  Location: [facility] ENDOSCOPY;  Service: Gastroenterology;  Laterality: N/A;   ESOPHAGOGASTRODUODENOSCOPY (EGD) WITH PROPOFOL N/A 10/02/2019   Procedure: ESOPHAGOGASTRODUODENOSCOPY (EGD) WITH PROPOFOL with Dilation;  Surgeon: Wyline Mood, MD;  Location: [facility] ENDOSCOPY;  Service: Gastroenterology;  Laterality: N/A;   ESOPHAGOGASTRODUODENOSCOPY (EGD) WITH PROPOFOL N/A 11/04/2019   Procedure: ESOPHAGOGASTRODUODENOSCOPY (EGD) WITH PROPOFOL;  Surgeon: Wyline Mood, MD;  Location: [facility] ENDOSCOPY;  Service: Gastroenterology;  Laterality: N/A;   ESOPHAGOGASTRODUODENOSCOPY (EGD) WITH PROPOFOL N/A 02/05/2020   Procedure: ESOPHAGOGASTRODUODENOSCOPY (EGD) WITH PROPOFOL;  Surgeon: Wyline Mood, MD;  Location: [facility] ENDOSCOPY;  Service: Gastroenterology;  Laterality: N/A;   ESOPHAGOGASTRODUODENOSCOPY (EGD) WITH PROPOFOL N/A 02/19/2020   Procedure: ESOPHAGOGASTRODUODENOSCOPY (EGD) WITH PROPOFOL;  Surgeon: Wyline Mood, MD;  Location: [facility] ENDOSCOPY;  Service: Gastroenterology;  Laterality: N/A;   ESOPHAGOGASTRODUODENOSCOPY (EGD) WITH PROPOFOL N/A 03/10/2020   Procedure: ESOPHAGOGASTRODUODENOSCOPY (EGD) WITH PROPOFOL;  Surgeon: Wyline Mood, MD;  Location: [facility] ENDOSCOPY;  Service: Gastroenterology;  Laterality: N/A;   ESOPHAGOGASTRODUODENOSCOPY (EGD) WITH PROPOFOL N/A 03/31/2020   Procedure: ESOPHAGOGASTRODUODENOSCOPY (EGD) WITH PROPOFOL;  Surgeon: Wyline Mood, MD;  Location: [facility] ENDOSCOPY;  Service: Gastroenterology;  Laterality: N/A;  7:30 AM PROCEDURE PER DR ANNA C-19 TEST ON 03/30/2020 AM   ESOPHAGOGASTRODUODENOSCOPY (EGD) WITH PROPOFOL N/A 04/22/2020   Procedure: ESOPHAGOGASTRODUODENOSCOPY (EGD) WITH PROPOFOL;  Surgeon: Wyline Mood, MD;  Location: [facility] ENDOSCOPY;  Service: Gastroenterology;  Laterality: N/A;   ESOPHAGOGASTRODUODENOSCOPY (EGD) WITH PROPOFOL N/A 05/26/2020   Procedure: ESOPHAGOGASTRODUODENOSCOPY (EGD)  WITH PROPOFOL;  Surgeon: Wyline Mood, MD;  Location: [facility] ENDOSCOPY;  Service: Gastroenterology;  Laterality: N/A;   EYE SURGERY     retinal detatchment   FRACTURE SURGERY     radical prostate     THORACIC LAMINECTOMY FOR EPIDURAL ABSCESS Bilateral 07/02/2021   Procedure: THORACIC LAMINECTOMY FOR EPIDURAL ABSCESS;  Surgeon: Venetia Night, MD;  Location: [facility] ORS;  Service: Neurosurgery;  Laterality: Bilateral;   TONSILLECTOMY      Current Medications: Current Meds  Medication Sig   amLODipine (NORVASC) 5 MG tablet TAKE 1 TABLET BY MOUTH DAILY   apixaban (ELIQUIS) 5 MG TABS tablet Take 1 tablet (5 mg total) by mouth 2 (two) times daily.   atorvastatin (LIPITOR) 40 MG tablet TAKE 1 TABLET BY MOUTH DAILY AT 6PM   cetirizine (ZYRTEC) 5 MG tablet Take 5 mg by mouth daily.   famciclovir (FAMVIR) 250 MG tablet Take 1 tablet (250 mg total) by mouth 2 (two) times daily.   famotidine (PEPCID) 20 MG tablet Take 1 tablet by mouth at bedtime.   hydrocortisone (PROCTO-MED HC) 2.5 % rectal cream Place 1 Application rectally 2 (two) times daily.   lidocaine (XYLOCAINE) 5 % ointment Apply 1 Application topically as needed.   losartan (COZAAR) 100 MG tablet TAKE 1 TABLET BY MOUTH DAILY   metoprolol tartrate (LOPRESSOR) 25 MG tablet Take 1 tablet (25 mg total) by mouth 2 (two) times daily.   Multiple Vitamins-Minerals (MULTIVITAMIN ADULT) CHEW Chew 2 tablets by mouth every morning.   omeprazole (PRILOSEC) 20 MG capsule Take 1 capsule (20 mg total) by mouth 2 (two) times daily before a meal.   traZODone (DESYREL) 50 MG tablet TAKE 1/2 TO 1 TABLET BY MOUTH AT BEDTIME AS NEEDED FOR SLEEP     Allergies:   Hydralazine   Social History   Socioeconomic History   Marital status: Married    Spouse name: Not on file   Number of children: Not on file   Years of  education: Not on file   Highest education level: Not on file  Occupational History   Not on file  Tobacco Use   Smoking status: Former    Smokeless tobacco: Never  Vaping Use   Vaping Use: Never used  Substance and Sexual Activity   Alcohol use: Not Currently   Drug use: Never   Sexual activity: Not on file  Other Topics Concern   Not on file  Social History Narrative   Lives at home with wife   Social Determinants of Health   Financial Resource Strain: Not on file  Food Insecurity: Not on file  Transportation Needs: Not on file  Physical Activity: Not on file  Stress: Not on file  Social Connections: Not on file     Family History: The patient's family history is not on file.  ROS:   Please see the history of present illness.     All other systems reviewed and are negative.  EKGs/Labs/Other Studies Reviewed:    The following studies were reviewed today:   EKG:  EKG not ordered today.   Recent Labs: 07/07/2021: Magnesium 2.0 07/09/2021: TSH 2.706 09/29/2021: Hemoglobin 10.5; Platelets 400 02/26/2022: ALT 10; BUN 19; Creatinine, Ser 0.80; Potassium 3.9; Sodium 138  Recent Lipid Panel    Component Value Date/Time   CHOL 150 02/26/2022 1028   TRIG 46.0 02/26/2022 1028   HDL 52.00 02/26/2022 1028   CHOLHDL 3 02/26/2022 1028   VLDL 9.2 02/26/2022 1028   LDLCALC 89 02/26/2022 1028     Risk Assessment/Calculations:         Physical Exam:    VS:  BP (!) 146/80 (BP Location: Left Arm, Patient Position: Sitting, Cuff Size: Normal)   Pulse (!) 53   Ht 5\' 11"  (1.803 m)   Wt 173 lb (78.5 kg)   SpO2 98%   BMI 24.13 kg/m     Wt Readings from Last 3 Encounters:  04/30/22 173 lb (78.5 kg)  04/18/22 175 lb (79.4 kg)  04/05/22 176 lb (79.8 kg)     GEN:  Well nourished, well developed in no acute distress HEENT: Normal NECK: No JVD; No carotid bruits CARDIAC: RRR, no murmurs, rubs, gallops RESPIRATORY:  Clear to auscultation without rales, wheezing or rhonchi  ABDOMEN: Soft, non-tender, non-distended MUSCULOSKELETAL:  No edema; No deformity  SKIN: Warm and dry NEUROLOGIC:  Alert and oriented x  3 PSYCHIATRIC:  Normal affect   ASSESSMENT:    1. Paroxysmal atrial fibrillation   2. Primary hypertension   3. Pure hypercholesterolemia   4. Ascending aorta dilation    PLAN:    In order of problems listed above:  Paroxysmal atrial fibrillation, CHA2DS2-VASc of 4.  A-fib occurred in the context of esophagectomy postop.  History of CVA.  ILR being planned, appreciate input from EP.  Continue Eliquis until loop recorder implantation.  Continue Lopressor. Hypertension, BP elevated today, usually controlled.  Continue losartan 100 mg, Lopressor. Hyperlipidemia, continue Lipitor 40 mg daily Mild ascending aorta dilatation, measuring 4.3 cm on chest CT 06/2021.  Continue serial monitoring yearly.  Follow-up in 12 months.     Medication Adjustments/Labs and Tests Ordered: Current medicines are reviewed at length with the patient today.  Concerns regarding medicines are outlined above.  Orders Placed This Encounter  Procedures   EKG 12-Lead   No orders of the defined types were placed in this encounter.   Patient Instructions  Medication Instructions:   Your physician recommends that you continue on your current medications  as directed. Please refer to the Current Medication list given to you today.  *If you need a refill on your cardiac medications before your next appointment, please call your pharmacy*   Lab Work:  None Ordered  If you have labs (blood work) drawn today and your tests are completely normal, you will receive your results only by: MyChart Message (if you have MyChart) OR A paper copy in the mail If you have any lab test that is abnormal or we need to change your treatment, we will call you to review the results.   Testing/Procedures:  None Ordered    Follow-Up: At [facility], you and your health needs are our priority.  As part of our continuing mission to provide you with exceptional heart care, we have created designated Provider Care  Teams.  These Care Teams include your primary Cardiologist (physician) and Advanced Practice Providers (APPs -  Physician Assistants and Nurse Practitioners) who all work together to provide you with the care you need, when you need it.  We recommend signing up for the patient portal called "MyChart".  Sign up information is provided on this After Visit Summary.  MyChart is used to connect with patients for Virtual Visits (Telemedicine).  Patients are able to view lab/test results, encounter notes, upcoming appointments, etc.  Non-urgent messages can be sent to your provider as well.   To learn more about what you can do with MyChart, go to [email or web address].    Your next appointment:   12 month(s)  Provider:   You may see Debbe Odea, MD or one of the following Advanced Practice Providers on your designated Care Team:   Nicolasa Ducking, NP Eula Listen, PA-C Cadence Fransico Michael, PA-C Charlsie Quest, NP    Signed, Debbe Odea, MD  04/30/2022 11:26 AM    [facility]

## 2022-04-30 NOTE — Patient Instructions (Signed)
Medication Instructions:   Your physician recommends that you continue on your current medications as directed. Please refer to the Current Medication list given to you today.  *If you need a refill on your cardiac medications before your next appointment, please call your pharmacy*   Lab Work:  None Ordered  If you have labs (blood work) drawn today and your tests are completely normal, you will receive your results only by: MyChart Message (if you have MyChart) OR A paper copy in the mail If you have any lab test that is abnormal or we need to change your treatment, we will call you to review the results.   Testing/Procedures:  None Ordered    Follow-Up: At [facility], you and your health needs are our priority.  As part of our continuing mission to provide you with exceptional heart care, we have created designated Provider Care Teams.  These Care Teams include your primary Cardiologist (physician) and Advanced Practice Providers (APPs -  Physician Assistants and Nurse Practitioners) who all work together to provide you with the care you need, when you need it.  We recommend signing up for the patient portal called "MyChart".  Sign up information is provided on this After Visit Summary.  MyChart is used to connect with patients for Virtual Visits (Telemedicine).  Patients are able to view lab/test results, encounter notes, upcoming appointments, etc.  Non-urgent messages can be sent to your provider as well.   To learn more about what you can do with MyChart, go to [email or web address].    Your next appointment:   12 month(s)  Provider:   You may see Brian Agbor-Etang, MD or one of the following Advanced Practice Providers on your designated Care Team:   Christopher Berge, NP Ryan Dunn, PA-C Cadence Furth, PA-C Sheri Hammock, NP  

## 2022-05-01 NOTE — Progress Notes (Unsigned)
Electrophysiology Office Follow up Visit Note:    Date:  05/02/2022   ID:  Andrew Mullins, DOB March 08, 1955, MRN [id number]  PCP:  Glori Luis, MD  [facility] Cardiologist:  Debbe Odea, MD  [facility] Electrophysiologist:  Lanier Prude, MD    Interval History:    Andrew Mullins is a 67 y.o. male who presents for a follow up visit.   I last saw him April 18, 2022 for his paroxysmal atrial fibrillation.  We discussed atrial fibrillation surveillance strategies at the last appointment and he presents today for loop recorder implant.  He is very interested in avoiding long-term exposure to anticoagulation.       Past medical, surgical, social and family history were reviewed.  ROS:   Please see the history of present illness.    All other systems reviewed and are negative.  EKGs/Labs/Other Studies Reviewed:    The following studies were reviewed today:  Physical Exam:    VS:  BP (!) 142/80   Pulse 65   Ht  (1.803 m)   Wt 173 lb 12.8 oz (78.8 kg)   SpO2 98%   BMI 24.24 kg/m     Wt Readings from Last 3 Encounters:  05/02/22 173 lb 12.8 oz (78.8 kg)  04/30/22 173 lb (78.5 kg)  04/18/22 175 lb (79.4 kg)     GEN:  Well nourished, well developed in no acute distress CARDIAC: RRR, no murmurs, rubs, gallops RESPIRATORY:  Clear to auscultation without rales, wheezing or rhonchi       ASSESSMENT:    1. Paroxysmal atrial fibrillation   2. Primary hypertension    PLAN:    In order of problems listed above:  #Paroxysmal atrial fibrillation Appears to have been a lone episode in the setting of esophagectomy.  He presents for loop recorder as a atrial fibrillation surveillance strategy.  He understands that he is at a slightly increased risk of stroke compared to staying on anticoagulation but he is most concerned about staying on daily anticoagulation given its associated bleeding risks.  I think this is a reasonable strategy for him  moving forward.  He will stop taking his anticoagulant today and start taking aspirin 81 mg by mouth once daily.  I discussed loop recorder implant procedure in detail including the risks and monthly monitoring cost and he wishes to proceed.  #Hypertension Slightly above goal today.  Recommend checking blood pressures 1-2 times per week at home and recording the values.  Recommend bringing these recordings to the primary care physician.         Signed, Steffanie Dunn, MD, [facility], [facility] 05/02/2022 11:26 AM    Electrophysiology [facility]  -------------------  SURGEON:  Lanier Prude, MD     PREPROCEDURE DIAGNOSIS:  Atrial fibrillation    POSTPROCEDURE DIAGNOSIS: Atrial fibrillation     PROCEDURES:   1. Implantable loop recorder implantation    INTRODUCTION:  Andrew Mullins presents with a history of atrial fibrillation The costs of loop recorder monitoring have been discussed with the patient.    DESCRIPTION OF PROCEDURE:  Informed written consent was obtained.  The patient required no sedation for the procedure today.  A time-out was performed. Mapping over the patient's chest was performed to identify the area where electrograms were most prominent for ILR recording.  This area was found to be the left parasternal region over the 4th intercostal space. The patients left chest was therefore prepped and draped  in the usual sterile fashion. The skin overlying the left parasternal region was infiltrated with lidocaine for local analgesia.  A 0.5-cm incision was made over the left parasternal region over the 3rd intercostal space.  A subcutaneous ILR pocket was fashioned using a combination of sharp and blunt dissection.  A [employer] [id number]-Dx [id number]) implantable loop recorder was then placed into the pocket  R waves were very prominent and measured >0.15mV.  Steri- Strips and a sterile dressing were then applied.  There were no early apparent  complications.     CONCLUSIONS:   1. Successful implantation of a implantable loop recorder for Atrial fibrillation  2. No early apparent complications.   Sheria Lang T. Lalla Brothers, MD, [facility], [facility] Cardiac Electrophysiology

## 2022-05-02 ENCOUNTER — Encounter: Payer: Self-pay | Admitting: Family Medicine

## 2022-05-02 ENCOUNTER — Encounter: Payer: Self-pay | Admitting: Cardiology

## 2022-05-02 ENCOUNTER — Ambulatory Visit (INDEPENDENT_AMBULATORY_CARE_PROVIDER_SITE_OTHER): Payer: Medicare Other | Admitting: Family Medicine

## 2022-05-02 ENCOUNTER — Ambulatory Visit: Payer: Medicare Other | Attending: Cardiology | Admitting: Cardiology

## 2022-05-02 VITALS — BP 116/70 | HR 71 | Temp 97.6°F | Ht 71.0 in | Wt 173.0 lb

## 2022-05-02 VITALS — BP 142/80 | HR 65 | Ht 71.0 in | Wt 173.8 lb

## 2022-05-02 DIAGNOSIS — M542 Cervicalgia: Secondary | ICD-10-CM

## 2022-05-02 DIAGNOSIS — I10 Essential (primary) hypertension: Secondary | ICD-10-CM

## 2022-05-02 DIAGNOSIS — I480 Paroxysmal atrial fibrillation: Secondary | ICD-10-CM

## 2022-05-02 DIAGNOSIS — G8929 Other chronic pain: Secondary | ICD-10-CM | POA: Diagnosis not present

## 2022-05-02 MED ORDER — ASPIRIN 81 MG PO TBEC
81.0000 mg | DELAYED_RELEASE_TABLET | Freq: Every day | ORAL | 3 refills | Status: DC
Start: 2022-05-02 — End: 2023-04-29

## 2022-05-02 MED ORDER — AMLODIPINE BESYLATE 10 MG PO TABS: 10.0000 mg | ORAL_TABLET | Freq: Every day | ORAL | 3 refills | Status: DC

## 2022-05-02 NOTE — Progress Notes (Signed)
Marikay Alar, MD Phone: [phone]  Andrew Mullins is a 67 y.o. male who presents today for f/u.  HYPERTENSION Disease Monitoring Home BP Monitoring generally 130s-low 140s systolic Chest pain- no    Dyspnea- no Medications Compliance-  taking amlodipine, losartan, metoprolol. Edema- no BMET    Component Value Date/Time   NA 138 02/26/2022 1028   K 3.9 02/26/2022 1028   CL 103 02/26/2022 1028   CO2 28 02/26/2022 1028   GLUCOSE 97 02/26/2022 1028   BUN 19 02/26/2022 1028   CREATININE 0.80 02/26/2022 1028   CREATININE 0.62 (L) 09/29/2021 1434   CALCIUM 9.3 02/26/2022 1028   GFRNONAA >60 08/17/2021 1257   GFRAA >60 09/17/2018 1201   Chronic neck pain: Patient has been seeing physical therapy.  Neck pain resulted from him having osteomyelitis previously.  Notes the neck pain is resulting in headaches and it particularly hurts if he turns to the left.  Social History   Tobacco Use  Smoking Status Former  Smokeless Tobacco Never    Current Outpatient Medications on File Prior to Visit  Medication Sig Dispense Refill   atorvastatin (LIPITOR) 40 MG tablet TAKE 1 TABLET BY MOUTH DAILY AT 6PM 90 tablet 1   cetirizine (ZYRTEC) 5 MG tablet Take 5 mg by mouth daily.     famciclovir (FAMVIR) 250 MG tablet Take 1 tablet (250 mg total) by mouth 2 (two) times daily. 180 tablet 3   famotidine (PEPCID) 20 MG tablet Take 1 tablet by mouth at bedtime.     hydrocortisone (PROCTO-MED HC) 2.5 % rectal cream Place 1 Application rectally 2 (two) times daily. 30 g 0   lidocaine (XYLOCAINE) 5 % ointment Apply 1 Application topically as needed. 35.44 g 0   losartan (COZAAR) 100 MG tablet TAKE 1 TABLET BY MOUTH DAILY 90 tablet 1   metoprolol tartrate (LOPRESSOR) 25 MG tablet Take 1 tablet (25 mg total) by mouth 2 (two) times daily. 180 tablet 3   Multiple Vitamins-Minerals (MULTIVITAMIN ADULT) CHEW Chew 2 tablets by mouth every morning.     omeprazole (PRILOSEC) 20 MG capsule Take 1 capsule  (20 mg total) by mouth 2 (two) times daily before a meal. 180 capsule 1   traZODone (DESYREL) 50 MG tablet TAKE 1/2 TO 1 TABLET BY MOUTH AT BEDTIME AS NEEDED FOR SLEEP 90 tablet 0   No current facility-administered medications on file prior to visit.     ROS see history of present illness  Objective  Physical Exam Vitals:   05/02/22 1623 05/02/22 1642  BP: 134/78 116/70  Pulse: 71   Temp: 97.6 F (36.4 C)   SpO2: 97%     BP Readings from Last 3 Encounters:  05/02/22 116/70  05/02/22 (!) 142/80  04/30/22 (!) 146/80   Wt Readings from Last 3 Encounters:  05/02/22 173 lb (78.5 kg)  05/02/22 173 lb 12.8 oz (78.8 kg)  04/30/22 173 lb (78.5 kg)    Physical Exam Constitutional:      General: He is not in acute distress.    Appearance: He is not diaphoretic.  Cardiovascular:     Rate and Rhythm: Normal rate and regular rhythm.     Heart sounds: Normal heart sounds.  Pulmonary:     Effort: Pulmonary effort is normal.     Breath sounds: Normal breath sounds.  Skin:    General: Skin is warm and dry.  Neurological:     Mental Status: He is alert.      Assessment/Plan: Please  see individual problem list.  Essential hypertension Assessment & Plan: Chronic issue.  Above goal.  Goal is less than 130/80.  We will increase amlodipine to 10 mg daily.  He will continue metoprolol 25 mg twice daily and losartan 100 mg daily.  Follow-up with me in 3 months for blood pressure check.  If the patient starts to have lightheadedness he will reduce his amlodipine back to 5 mg daily.  Orders: -     amLODIPine Besylate; Take 1 tablet (10 mg total) by mouth daily.  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  Discussed continuing the physical therapy.  Discussed the potential for trying an antidepressant to help with headaches and his pain though he defers at this time.     Return in about 3  months (around 08/01/2022).   Marikay Alar, MD [facility]

## 2022-05-02 NOTE — Assessment & Plan Note (Signed)
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  Discussed continuing the physical therapy.  Discussed the potential for trying an antidepressant to help with headaches and his pain though he defers at this time.

## 2022-05-02 NOTE — Assessment & Plan Note (Addendum)
Chronic issue.  Above goal.  Goal is less than 130/80.  We will increase amlodipine to 10 mg daily.  He will continue metoprolol 25 mg twice daily and losartan 100 mg daily.  Follow-up with me in 3 months for blood pressure check.  If the patient starts to have lightheadedness he will reduce his amlodipine back to 5 mg daily.

## 2022-05-02 NOTE — Patient Instructions (Addendum)
Medication Instructions:  Your physician has recommended you make the following change in your medication: STOP TAKING: Eliquis, today; 05/02/2022;  START TAKING: Aspirin 81 mg, by mouth, once daily.   You will-  Take 1 tablet (81 mg total) by mouth daily.   Labwork: None ordered.  Testing/Procedures: None ordered.  Follow-Up:  Your physician wants you to follow-up in: one year with Dr. Steffanie Dunn or EP APP.  You will receive a reminder letter in the mail two months in advance. If you don't receive a letter, please call our office to schedule the follow-up appointment.    Implantable Loop Recorder Placement, Care After This sheet gives you information about how to care for yourself after your procedure. Your health care provider may also give you more specific instructions. If you have problems or questions, contact your health care provider. What can I expect after the procedure? After the procedure, it is common to have: Soreness or discomfort near the incision. Some swelling or bruising near the incision.  Follow these instructions at home: Incision care  Monitor your cardiac device site for redness, swelling, and drainage. Call the device clinic at [phone] if you experience these symptoms or fever/chills.  Keep the large square bandage on your site for 24 hours and then you may remove it yourself. Keep the steri-strips underneath in place.   You may shower after 72 hours / 3 days from your procedure with the steri-strips in place. They will usually fall off on their own, or may be removed after 10 days. Pat dry.   Avoid lotions, ointments, or perfumes over your incision until it is well-healed.  Please do not submerge in water until your site is completely healed.   Your device is MRI compatible.   Remote monitoring is used to monitor your cardiac device from home. This monitoring is scheduled every month by our office. It allows [place] to keep an eye on the function  of your device to ensure it is working properly.  If your wound site starts to bleed apply pressure.    For help with the monitor please call [employer] Monitor Support Specialist directly at [phone].    If you have any questions/concerns please call the device clinic at [phone].  Activity  Return to your normal activities.  General instructions Follow instructions from your health care provider about how to manage your implantable loop recorder and transmit the information. Learn how to activate a recording if this is necessary for your type of device. You may go through a metal detection gate, and you may let someone hold a metal detector over your chest. Show your ID card if needed. Do not have an MRI unless you check with your health care provider first. Take over-the-counter and prescription medicines only as told by your health care provider. Keep all follow-up visits as told by your health care provider. This is important. Contact a health care provider if: You have redness, swelling, or pain around your incision. You have a fever. You have pain that is not relieved by your pain medicine. You have triggered your device because of fainting (syncope) or because of a heartbeat that feels like it is racing, slow, fluttering, or skipping (palpitations). Get help right away if you have: Chest pain. Difficulty breathing. Summary After the procedure, it is common to have soreness or discomfort near the incision. Change your dressing as told by your health care provider. Follow instructions from your health care provider about how to manage your  implantable loop recorder and transmit the information. Keep all follow-up visits as told by your health care provider. This is important. This information is not intended to replace advice given to you by your health care provider. Make sure you discuss any questions you have with your health care provider. Document Released:  12/13/2014 Document Revised: 02/16/2017 Document Reviewed: 02/16/2017 Elsevier Patient Education  2020 [employer].

## 2022-05-24 ENCOUNTER — Ambulatory Visit (INDEPENDENT_AMBULATORY_CARE_PROVIDER_SITE_OTHER): Payer: Medicare Other | Admitting: Neurosurgery

## 2022-05-24 ENCOUNTER — Encounter: Payer: Self-pay | Admitting: Neurosurgery

## 2022-05-24 VITALS — BP 125/70 | Ht 71.0 in | Wt 173.0 lb

## 2022-05-24 DIAGNOSIS — R42 Dizziness and giddiness: Secondary | ICD-10-CM | POA: Diagnosis not present

## 2022-05-24 DIAGNOSIS — R519 Headache, unspecified: Secondary | ICD-10-CM

## 2022-05-24 DIAGNOSIS — M546 Pain in thoracic spine: Secondary | ICD-10-CM

## 2022-05-24 DIAGNOSIS — G8929 Other chronic pain: Secondary | ICD-10-CM | POA: Diagnosis not present

## 2022-05-24 NOTE — Progress Notes (Signed)
   DOS: 07/02/21 (T1-2 laminotomy for epidural abscess)  HISTORY OF PRESENT ILLNESS: 05/24/2022 Mr.Anastos is doing better than he previously was.  Physical therapy has been helpful.  He is having some issues with headaches and as well as imbalance or disequilibrium.  He was previously worked up for this but did not have resolution of what was causing it.  04/05/2022 Mr. Denner is 14-month status post thoracic laminotomy for epidural abscess.  He had an esophagectomy and has recovered from that.  He has been having approximate 1 month of pain in between his shoulder blades.  It worsens through the day.  He has no other neurologic symptoms.  He is unable to take NSAIDs.  08/24/2021 Mr. Carr Mattinson is status post laminotomy for epidural abscess.  He is now off antibiotics and is starting to see some improvement in his physical condition from this.  He has had worsening of his esophageal strictures, and will undergo surgery for this on August 25th at [facility].   PHYSICAL EXAMINATION:   Vitals:   05/24/22 1323  BP: 125/70    General: Patient is well developed, well nourished, calm, collected, and in no apparent distress. 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  He is having continued pain in between his shoulder blades that worsens through the day.  I feel this is likely musculoskeletal.  He has done well with physical therapy.  He has stopped Eliquis, so will not be able to start back on ibuprofen.  I recommended 600 and milligrams up to 3 times per day.  I will refer him to neurology for evaluation.     I spent a total of 15 minutes in face-to-face and non-face-to-face activities related to this patient's care today.   Venetia Night MD, [facility] Department of Neurosurgery

## 2022-05-31 ENCOUNTER — Ambulatory Visit: Payer: Medicare Other | Admitting: Neurosurgery

## 2022-06-04 ENCOUNTER — Ambulatory Visit (INDEPENDENT_AMBULATORY_CARE_PROVIDER_SITE_OTHER): Payer: Medicare Other

## 2022-06-04 DIAGNOSIS — I480 Paroxysmal atrial fibrillation: Secondary | ICD-10-CM

## 2022-06-05 LAB — CUP PACEART REMOTE DEVICE CHECK
Date Time Interrogation Session: 20240520021300
Implantable Pulse Generator Implant Date: 20240417
Pulse Gen Serial Number: 107561

## 2022-06-14 ENCOUNTER — Encounter: Payer: Self-pay | Admitting: Family Medicine

## 2022-06-22 ENCOUNTER — Other Ambulatory Visit: Payer: Self-pay | Admitting: Family Medicine

## 2022-06-22 DIAGNOSIS — I10 Essential (primary) hypertension: Secondary | ICD-10-CM

## 2022-06-22 DIAGNOSIS — G479 Sleep disorder, unspecified: Secondary | ICD-10-CM

## 2022-06-22 DIAGNOSIS — E785 Hyperlipidemia, unspecified: Secondary | ICD-10-CM

## 2022-07-02 NOTE — Progress Notes (Signed)
BSX LOOP RECORDER 

## 2022-08-13 ENCOUNTER — Ambulatory Visit (INDEPENDENT_AMBULATORY_CARE_PROVIDER_SITE_OTHER): Payer: Medicare Other

## 2022-08-13 DIAGNOSIS — I77810 Thoracic aortic ectasia: Secondary | ICD-10-CM

## 2022-08-13 LAB — CUP PACEART REMOTE DEVICE CHECK
Date Time Interrogation Session: 20240729001800
Implantable Pulse Generator Implant Date: 20240417
Pulse Gen Serial Number: 107561

## 2022-08-17 ENCOUNTER — Ambulatory Visit: Payer: Medicare Other | Admitting: Family Medicine

## 2022-08-29 NOTE — Progress Notes (Signed)
Merlin Loop Recorder  

## 2022-09-19 ENCOUNTER — Other Ambulatory Visit: Payer: Self-pay | Admitting: Family Medicine

## 2022-09-19 DIAGNOSIS — K219 Gastro-esophageal reflux disease without esophagitis: Secondary | ICD-10-CM

## 2022-10-15 ENCOUNTER — Ambulatory Visit (INDEPENDENT_AMBULATORY_CARE_PROVIDER_SITE_OTHER): Payer: Medicare Other

## 2022-10-15 DIAGNOSIS — I480 Paroxysmal atrial fibrillation: Secondary | ICD-10-CM | POA: Diagnosis not present

## 2022-10-15 LAB — CUP PACEART REMOTE DEVICE CHECK
Date Time Interrogation Session: 20240930141900
Implantable Pulse Generator Implant Date: 20240417
Pulse Gen Serial Number: 107561

## 2022-10-29 NOTE — Progress Notes (Signed)
[place] [employer]

## 2022-11-15 ENCOUNTER — Ambulatory Visit (INDEPENDENT_AMBULATORY_CARE_PROVIDER_SITE_OTHER): Payer: Medicare Other

## 2022-11-15 DIAGNOSIS — I480 Paroxysmal atrial fibrillation: Secondary | ICD-10-CM

## 2022-11-15 LAB — CUP PACEART REMOTE DEVICE CHECK
Date Time Interrogation Session: 20241031012300
Implantable Pulse Generator Implant Date: 20240417
Pulse Gen Serial Number: 107561

## 2022-11-28 NOTE — Progress Notes (Signed)
Carelink Summary Report / Loop Recorder 

## 2022-12-17 ENCOUNTER — Ambulatory Visit (INDEPENDENT_AMBULATORY_CARE_PROVIDER_SITE_OTHER): Payer: Medicare Other

## 2022-12-17 DIAGNOSIS — I480 Paroxysmal atrial fibrillation: Secondary | ICD-10-CM | POA: Diagnosis not present

## 2022-12-17 LAB — CUP PACEART REMOTE DEVICE CHECK
Date Time Interrogation Session: 20241202002800
Implantable Pulse Generator Implant Date: 20240417
Pulse Gen Serial Number: 107561

## 2022-12-31 ENCOUNTER — Other Ambulatory Visit: Payer: Self-pay | Admitting: Family Medicine

## 2022-12-31 DIAGNOSIS — I10 Essential (primary) hypertension: Secondary | ICD-10-CM

## 2023-01-01 NOTE — Telephone Encounter (Signed)
Please contact the patient and see if he is taking amlodipine 5 mg daily or amlodipine 10 mg daily.  He was supposed to follow-up over the summer for his blood pressure though it looks like that did not happen.  Please get him scheduled with me or another provider to transfer care to follow-up on his blood pressure.  Thanks.

## 2023-01-02 ENCOUNTER — Telehealth: Payer: Self-pay

## 2023-01-02 ENCOUNTER — Other Ambulatory Visit: Payer: Self-pay | Admitting: Family Medicine

## 2023-01-02 DIAGNOSIS — I10 Essential (primary) hypertension: Secondary | ICD-10-CM

## 2023-01-02 MED ORDER — AMLODIPINE BESYLATE 10 MG PO TABS: 10.0000 mg | ORAL_TABLET | Freq: Every day | ORAL | 1 refills | Status: DC

## 2023-01-02 NOTE — Telephone Encounter (Signed)
Copied from CRM [id number]. Topic: Clinical - Medication Refill >> Jan 02, 2023 10:30 AM Denese Killings wrote: Most Recent Primary Care Visit:  Provider: Birdie Sons ERIC G  Department: [facility]  Visit Type: OFFICE VISIT  Date: 05/02/2022  Medication: amLODipine (NORVASC) 5 MG  Has the patient contacted their pharmacy? Yes Pharmacy has not received prescription  (Agent: If no, request that the patient contact the pharmacy for the refill. If patient does not wish to contact the pharmacy document the reason why and proceed with request.) (Agent: If yes, when and what did the pharmacy advise?)  Is this the correct pharmacy for this prescription? Yes If no, delete pharmacy and type the correct one.  This is the patient's preferred pharmacy:  [facility] [id number] [place], [place] - [address] [place] [address] Phone: [phone] Fax: [phone]   Has the prescription been filled recently? No  Is the patient out of the medication? No  Has the patient been seen for an appointment in the last year OR does the patient have an upcoming appointment? Yes  Can we respond through MyChart? Yes  Agent: Please be advised that Rx refills may take up to 3 business days. We ask that you follow-up with your pharmacy.

## 2023-01-02 NOTE — Telephone Encounter (Signed)
Copied from CRM [id number]. Topic: Clinical - Medication Refill >> Jan 02, 2023 10:30 AM Denese Killings wrote: Most Recent Primary Care Visit:  Provider: Birdie Sons ERIC G  Department: [facility]  Visit Type: OFFICE VISIT  Date: 05/02/2022  Medication: amLODipine (NORVASC) 5 MG  Has the patient contacted their pharmacy? Yes Pharmacy has not received prescription  (Agent: If no, request that the patient contact the pharmacy for the refill. If patient does not wish to contact the pharmacy document the reason why and proceed with request.) (Agent: If yes, when and what did the pharmacy advise?)  Is this the correct pharmacy for this prescription? Yes If no, delete pharmacy and type the correct one.  This is the patient's preferred pharmacy:  [facility] [id number] [place], [place] - [address] [place] [address] Phone: [phone] Fax: [phone]   Has the prescription been filled recently? No  Is the patient out of the medication? Yes Patient is going out of town for a month and needs it today   Has the patient been seen for an appointment in the last year OR does the patient have an upcoming appointment? Yes  Can we respond through MyChart? Yes  Agent: Please be advised that Rx refills may take up to 3 business days. We ask that you follow-up with your pharmacy.

## 2023-01-02 NOTE — Telephone Encounter (Signed)
Medication has been refilled.

## 2023-01-04 NOTE — Telephone Encounter (Signed)
Left message for Patient to call the office back regarding his medication and scheduling an appointment,

## 2023-01-07 NOTE — Telephone Encounter (Signed)
Please make sure you follow-up with the patient to get him scheduled for follow-up as outlined in my prior message.

## 2023-01-17 ENCOUNTER — Ambulatory Visit (INDEPENDENT_AMBULATORY_CARE_PROVIDER_SITE_OTHER): Payer: Medicare Other

## 2023-01-17 DIAGNOSIS — I480 Paroxysmal atrial fibrillation: Secondary | ICD-10-CM

## 2023-01-17 LAB — CUP PACEART REMOTE DEVICE CHECK
Date Time Interrogation Session: 20250101193200
Implantable Pulse Generator Implant Date: 20240417
Pulse Gen Serial Number: 107561

## 2023-01-25 ENCOUNTER — Other Ambulatory Visit: Payer: Self-pay | Admitting: Family Medicine

## 2023-01-25 DIAGNOSIS — E785 Hyperlipidemia, unspecified: Secondary | ICD-10-CM

## 2023-01-25 DIAGNOSIS — G479 Sleep disorder, unspecified: Secondary | ICD-10-CM

## 2023-01-25 NOTE — Telephone Encounter (Signed)
 Copied from CRM [id number]. Topic: Clinical - Medication Refill >> Jan 25, 2023 10:57 AM Powell NOVAK wrote: Most Recent Primary Care Visit:  Provider: MARIBETH ERIC G  Department: [facility]  Visit Type: OFFICE VISIT  Date: 05/02/2022  Medication: ***  Has the patient contacted their pharmacy?  (Agent: If no, request that the patient contact the pharmacy for the refill. If patient does not wish to contact the pharmacy document the reason why and proceed with request.) (Agent: If yes, when and what did the pharmacy advise?)  Is this the correct pharmacy for this prescription?  If no, delete pharmacy and type the correct one.  This is the patient's preferred pharmacy:  [facility] [id number] [place], [place] - [address] [place] [address] Phone: [phone] Fax: [phone]  [id number] [place], [place] - [address] AT [facility] OF [address] [place] [address] Phone: [phone] Fax: [phone]  [facility] - [place], [place] - [address] [place] [address] Phone: [phone] Fax: [phone]   Has the prescription been filled recently?   Is the patient out of the medication?   Has the patient been seen for an appointment in the last year OR does the patient have an upcoming appointment?   Can we respond through MyChart?   Agent: Please be advised that Rx refills may take up to 3 business days. We ask that you follow-up with your pharmacy.

## 2023-02-18 ENCOUNTER — Ambulatory Visit (INDEPENDENT_AMBULATORY_CARE_PROVIDER_SITE_OTHER): Payer: Medicare Other

## 2023-02-18 DIAGNOSIS — I480 Paroxysmal atrial fibrillation: Secondary | ICD-10-CM | POA: Diagnosis not present

## 2023-02-18 LAB — CUP PACEART REMOTE DEVICE CHECK
Date Time Interrogation Session: 20250203023700
Implantable Pulse Generator Implant Date: 20240417
Pulse Gen Serial Number: 107561

## 2023-02-23 ENCOUNTER — Encounter: Payer: Self-pay | Admitting: Cardiology

## 2023-02-27 NOTE — Progress Notes (Signed)
Carelink Summary Report / Loop Recorder

## 2023-03-25 ENCOUNTER — Ambulatory Visit: Payer: Medicare Other

## 2023-03-25 DIAGNOSIS — I480 Paroxysmal atrial fibrillation: Secondary | ICD-10-CM

## 2023-03-26 LAB — CUP PACEART REMOTE DEVICE CHECK
Date Time Interrogation Session: 20250310004200
Implantable Pulse Generator Implant Date: 20240417
Pulse Gen Serial Number: 107561

## 2023-03-27 ENCOUNTER — Telehealth: Payer: Self-pay | Admitting: Family Medicine

## 2023-03-27 NOTE — Telephone Encounter (Signed)
 Dr Birdie Sons is no longer at this location. Please call the office to schedule a Transfer of Care to either Dr Charlann Lange, Darleen Crocker or Kara Dies, NP. E2C2 please schedule.  Thank you

## 2023-03-27 NOTE — Progress Notes (Signed)
 Bsx Loop Recorder

## 2023-03-28 ENCOUNTER — Encounter: Payer: Self-pay | Admitting: Cardiology

## 2023-04-25 ENCOUNTER — Ambulatory Visit (INDEPENDENT_AMBULATORY_CARE_PROVIDER_SITE_OTHER): Admitting: Neurosurgery

## 2023-04-25 ENCOUNTER — Encounter: Payer: Self-pay | Admitting: Neurosurgery

## 2023-04-25 VITALS — BP 154/86 | Ht 71.0 in | Wt 173.0 lb

## 2023-04-25 DIAGNOSIS — M4644 Discitis, unspecified, thoracic region: Secondary | ICD-10-CM

## 2023-04-25 DIAGNOSIS — G8929 Other chronic pain: Secondary | ICD-10-CM | POA: Diagnosis not present

## 2023-04-25 DIAGNOSIS — M532X4 Spinal instabilities, thoracic region: Secondary | ICD-10-CM

## 2023-04-25 MED ORDER — METHOCARBAMOL 500 MG PO TABS: 500.0000 mg | ORAL_TABLET | Freq: Four times a day (QID) | ORAL | 0 refills | Status: AC | PRN

## 2023-04-25 NOTE — Progress Notes (Signed)
   DOS: 07/02/21 (T1-2 laminotomy for epidural abscess)  HISTORY OF PRESENT ILLNESS: 04/25/2023 Andrew Mullins has been having difficulty with pain in his intrascapular region that has been worsening over the past year.  He particular has difficulty later in the afternoons.  This is after he has been active.  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  Of note, he had surgery for an epidural abscess at T1-2 almost 2 years ago.  05/24/2022 Andrew Mullins is doing better than he previously was.  Physical therapy has been helpful.  He is having some issues with headaches and as well as imbalance or disequilibrium.  He was previously worked up for this but did not have resolution of what was causing it.  04/05/2022 Andrew Mullins is 5-month status post thoracic laminotomy for epidural abscess.  He had an esophagectomy and has recovered from that.  He has been having approximate 1 month of pain in between his shoulder blades.  It worsens through the day.  He has no other neurologic symptoms.  He is unable to take NSAIDs.  08/24/2021 Andrew Mullins is status post laminotomy for epidural abscess.  He is now off antibiotics and is starting to see some improvement in his physical condition from this.  He has had worsening of his esophageal strictures, and will undergo surgery for this on August 25th at [facility].   PHYSICAL EXAMINATION:   Vitals:   04/25/23 0941  BP: (!) 154/86    General: Patient is well developed, well nourished, calm, collected, and in no apparent distress. ROS (Neurologic): Negative except as noted above   5 out of 5 strength in his bilateral upper and lower extremities.  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   I am concerned that he may have developed  instability of the thoracic spine due to his T1 to changes from discitis.  I would like to get updated imaging.  Will review this after his imaging is performed.   I spent a total of 15 minutes in face-to-face and non-face-to-face activities related to this patient's care today.   Venetia Night MD, [facility] Department of Neurosurgery

## 2023-04-26 NOTE — Progress Notes (Signed)
 Bsx Loop Recorder

## 2023-04-26 NOTE — Addendum Note (Signed)
 Addended by: Geralyn Flash D on: 04/26/2023 03:40 PM   Modules accepted: Orders

## 2023-04-27 ENCOUNTER — Other Ambulatory Visit: Payer: Self-pay | Admitting: Cardiology

## 2023-04-27 DIAGNOSIS — I480 Paroxysmal atrial fibrillation: Secondary | ICD-10-CM

## 2023-04-30 ENCOUNTER — Other Ambulatory Visit: Payer: Self-pay

## 2023-04-30 ENCOUNTER — Telehealth: Payer: Self-pay

## 2023-04-30 DIAGNOSIS — I480 Paroxysmal atrial fibrillation: Secondary | ICD-10-CM

## 2023-04-30 DIAGNOSIS — I10 Essential (primary) hypertension: Secondary | ICD-10-CM

## 2023-04-30 MED ORDER — ASPIRIN 81 MG PO TBEC: 81.0000 mg | DELAYED_RELEASE_TABLET | Freq: Every day | ORAL | 1 refills | Status: AC

## 2023-04-30 NOTE — Telephone Encounter (Signed)
 Copied from CRM [id number]. Topic: Clinical - Medication Refill >> Apr 30, 2023 10:52 AM Jethro Morrison wrote: Most Recent Primary Care Visit:  Provider: Lovetta Rucks ERIC G  Department: [facility]  Visit Type: OFFICE VISIT  Date: 05/02/2022  Medication: losartan (COZAAR) 100 MG tablet, amLODipine (NORVASC) 10 MG  Has the patient contacted their pharmacy? Yes (Agent: If no, request that the patient contact the pharmacy for the refill. If patient does not wish to contact the pharmacy document the reason why and proceed with request.) (Agent: If yes, when and what did the pharmacy advise?)  Is this the correct pharmacy for this prescription? Yes If no, delete pharmacy and type the correct one.  This is the patient's preferred pharmacy:  [facility] [id number] [place], [place] - [address] [place] [address] Phone: [phone] Fax: [phone]     Has the prescription been filled recently? No  Is the patient out of the medication? Yes  Has the patient been seen for an appointment in the last year OR does the patient have an upcoming appointment? Yes  Can we respond through MyChart? Yes  Agent: Please be advised that Rx refills may take up to 3 business days. We ask that you follow-up with your pharmacy.

## 2023-04-30 NOTE — Telephone Encounter (Signed)
 Can not fulfill medication refill request at this time. Pt is not established with new PCP. RN called to notify pt. Unable to speak with pt, but left voicemail explaining that pt needs a sooner appt of TOC than December 2025.

## 2023-04-30 NOTE — Telephone Encounter (Signed)
 Toc needed before refills can be sent. Mychart msg sent to pt to schedule as well

## 2023-05-01 ENCOUNTER — Other Ambulatory Visit: Payer: Self-pay

## 2023-05-01 ENCOUNTER — Ambulatory Visit

## 2023-05-01 VITALS — BP 138/72 | HR 61 | Temp 98.1°F | Ht 71.0 in | Wt 176.2 lb

## 2023-05-01 DIAGNOSIS — D649 Anemia, unspecified: Secondary | ICD-10-CM

## 2023-05-01 DIAGNOSIS — I4891 Unspecified atrial fibrillation: Secondary | ICD-10-CM

## 2023-05-01 DIAGNOSIS — Z79899 Other long term (current) drug therapy: Secondary | ICD-10-CM | POA: Diagnosis not present

## 2023-05-01 DIAGNOSIS — M79641 Pain in right hand: Secondary | ICD-10-CM | POA: Insufficient documentation

## 2023-05-01 DIAGNOSIS — A6001 Herpesviral infection of penis: Secondary | ICD-10-CM | POA: Diagnosis not present

## 2023-05-01 DIAGNOSIS — G479 Sleep disorder, unspecified: Secondary | ICD-10-CM

## 2023-05-01 DIAGNOSIS — E785 Hyperlipidemia, unspecified: Secondary | ICD-10-CM

## 2023-05-01 DIAGNOSIS — I10 Essential (primary) hypertension: Secondary | ICD-10-CM | POA: Diagnosis not present

## 2023-05-01 DIAGNOSIS — M79642 Pain in left hand: Secondary | ICD-10-CM

## 2023-05-01 DIAGNOSIS — C159 Malignant neoplasm of esophagus, unspecified: Secondary | ICD-10-CM

## 2023-05-01 DIAGNOSIS — Z131 Encounter for screening for diabetes mellitus: Secondary | ICD-10-CM | POA: Insufficient documentation

## 2023-05-01 DIAGNOSIS — K219 Gastro-esophageal reflux disease without esophagitis: Secondary | ICD-10-CM

## 2023-05-01 DIAGNOSIS — R600 Localized edema: Secondary | ICD-10-CM | POA: Insufficient documentation

## 2023-05-01 LAB — COMPREHENSIVE METABOLIC PANEL WITH GFR
ALT: 11 U/L (ref 0–53)
AST: 15 U/L (ref 0–37)
Albumin: 4.2 g/dL (ref 3.5–5.2)
Alkaline Phosphatase: 59 U/L (ref 39–117)
BUN: 19 mg/dL (ref 6–23)
CO2: 27 meq/L (ref 19–32)
Calcium: 9.1 mg/dL (ref 8.4–10.5)
Chloride: 103 meq/L (ref 96–112)
Creatinine, Ser: 0.85 mg/dL (ref 0.40–1.50)
GFR: 89.73 mL/min (ref >60.00)
Glucose, Bld: 106 mg/dL — ABNORMAL HIGH (ref 70–99)
Potassium: 3.7 meq/L (ref 3.5–5.1)
Sodium: 138 meq/L (ref 135–145)
Total Bilirubin: 0.5 mg/dL (ref 0.2–1.2)
Total Protein: 6.9 g/dL (ref 6.0–8.3)

## 2023-05-01 LAB — CBC WITH DIFFERENTIAL/PLATELET
Basophils Absolute: 0 10*3/uL (ref 0.0–0.1)
Basophils Relative: 1 % (ref 0.0–3.0)
Eosinophils Absolute: 0.2 10*3/uL (ref 0.0–0.7)
Eosinophils Relative: 4.5 % (ref 0.0–5.0)
HCT: 35.9 % — ABNORMAL LOW (ref 39.0–52.0)
Hemoglobin: 11.7 g/dL — ABNORMAL LOW (ref 13.0–17.0)
Lymphocytes Relative: 32.2 % (ref 12.0–46.0)
Lymphs Abs: 1.2 10*3/uL (ref 0.7–4.0)
MCHC: 32.7 g/dL (ref 30.0–36.0)
MCV: 81.1 fl (ref 78.0–100.0)
Monocytes Absolute: 0.6 10*3/uL (ref 0.1–1.0)
Monocytes Relative: 15.1 % — ABNORMAL HIGH (ref 3.0–12.0)
Neutro Abs: 1.8 10*3/uL (ref 1.4–7.7)
Neutrophils Relative %: 47.2 % (ref 43.0–77.0)
Platelets: 159 10*3/uL (ref 150.0–400.0)
RBC: 4.43 Mil/uL (ref 4.22–5.81)
RDW: 15 % (ref 11.5–15.5)
WBC: 3.8 10*3/uL — ABNORMAL LOW (ref 4.0–10.5)

## 2023-05-01 LAB — LIPID PANEL
Cholesterol: 138 mg/dL (ref 0–200)
HDL: 59.7 mg/dL (ref >39.00)
LDL Cholesterol: 72 mg/dL (ref 0–99)
NonHDL: 78.74
Total CHOL/HDL Ratio: 2
Triglycerides: 34 mg/dL (ref 0.0–149.0)
VLDL: 6.8 mg/dL (ref 0.0–40.0)

## 2023-05-01 LAB — VITAMIN B12: Vitamin B-12: 382 pg/mL (ref 211–911)

## 2023-05-01 LAB — HEMOGLOBIN A1C: Hgb A1c MFr Bld: 6.2 % (ref 4.6–6.5)

## 2023-05-01 LAB — TSH: TSH: 1.41 u[IU]/mL (ref 0.35–5.50)

## 2023-05-01 MED ORDER — ATORVASTATIN CALCIUM 40 MG PO TABS: 40.0000 mg | ORAL_TABLET | Freq: Every day | ORAL | 3 refills | Status: AC

## 2023-05-01 MED ORDER — METOPROLOL TARTRATE 25 MG PO TABS: 25.0000 mg | ORAL_TABLET | Freq: Two times a day (BID) | ORAL | 3 refills | Status: AC

## 2023-05-01 MED ORDER — AMLODIPINE BESYLATE 5 MG PO TABS: 5.0000 mg | ORAL_TABLET | Freq: Every day | ORAL | 3 refills | Status: AC

## 2023-05-01 MED ORDER — LOSARTAN POTASSIUM 100 MG PO TABS: 100.0000 mg | ORAL_TABLET | Freq: Every day | ORAL | 1 refills | Status: AC

## 2023-05-01 MED ORDER — OMEPRAZOLE 20 MG PO CPDR: 20.0000 mg | DELAYED_RELEASE_CAPSULE | Freq: Two times a day (BID) | ORAL | 3 refills | Status: AC

## 2023-05-01 MED ORDER — FAMCICLOVIR 250 MG PO TABS: 250.0000 mg | ORAL_TABLET | Freq: Every day | ORAL | 3 refills | Status: AC

## 2023-05-01 MED ORDER — TRAZODONE HCL 50 MG PO TABS: 50.0000 mg | ORAL_TABLET | Freq: Every day | ORAL | 3 refills | Status: AC

## 2023-05-01 NOTE — Assessment & Plan Note (Signed)
 Chronic, on suppressive therapy with famciclovir to 250 mg once a day.  Does not recall last episode.  Continue.  Refill sent today.

## 2023-05-01 NOTE — Assessment & Plan Note (Signed)
 Varicose veins noted.  Likely related to venous insufficiency.  Recommend elevating legs, wearing compression stocking to help reduce swelling.  If this becomes a persistent problem patient will reach out to us .

## 2023-05-01 NOTE — Assessment & Plan Note (Signed)
 Physical exam findings normal today.  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  Recommend stretching, icing when pain occurs.  If symptoms become bothersome I recommend patient reach out to us  and we can refer him to occupational therapy for exercises.  Can consider getting x-ray in the future if pain, weakness persists.

## 2023-05-01 NOTE — Progress Notes (Signed)
 Established Patient Office Visit   Subjective  Patient ID: Andrew Mullins, male    DOB: [birth year]  Age: 68 y.o. MRN: [id number]  Chief Complaint  Patient presents with   Transitions Of Care    Refills     He  has a past medical history of Cancer [facility]), Complication of anesthesia, GERD (gastroesophageal reflux disease), History of CVA (cerebrovascular accident) (10/10/2018), Hypertension, Prostate cancer (HCC), and Stroke (HCC) (2020).  HPI Established patient of Dr. Birdie Sons presenting for transfer of care and medication check. Last visit with Dr. Birdie Sons in 05/02/22.  Patient tells me that since his last visit he ran out of antihypertensive medication and has been taking metoprolol once a day instead of twice a day.  He reached out to our clinic for refill but was told he could not get to see me till December.  He is a little bit frustrated about not getting his medication refill since his PCP left this practice.  1) Essential hypertension: Patient does not check blood pressure at home.  Denies headache, chest pain, palpitations.  Patient is supposed to be taking metoprolol 25 mg twice a day but has been taking metoprolol once a day due to him running out of the medication.  He is also taking amlodipine 5 mg and losartan 100 mg daily.  Patient does not drink alcohol.   2) Insomnia: Currently on trazodone 50 mg once a day.  Denies side effect from medication including failure groggy.  Needs refill.  3) H/O stroke, a fib: Sees cardiology.  Currently on aspirin 81 mg.  4) Hyperlipidemia: On atorvastatin 40 mg once a day.  Needs refill.  With lipid panel rechecked.  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 Has seen Dr. Lawrence Marseilles at [facility], s/p b/l thyroplasty b/l on 10/26/22: GERD symptoms stable on omeprazole 20 mg twice a day and famotidine 20 mg at bedtime.  Patient needs refill.  He is currently not established with  gastroenterology clinic.  6) Discitis of thoracic region, saw neurosurgeon Dr. Venetia Night on 04/25/23, concern for instability of the thoracic spine due to his T1 to changes from discitis and recommends further imaging of thoracic spine.   7) Pain b/l hands, weakness of left hand with repetitive motions on left side: Noticed for the last 3 months.  Patient reports pain, weakness with repeated motions, grasping motions.  8) Left lower leg mild swelling: Has been going on for years.  Has had surgery to right knee ankle in the past.     ROS As per HPI    Objective:     BP 138/72   Pulse 61   Temp 98.1 F (36.7 C) (Oral)   Ht 5\' 11"  (1.803 m)   Wt 176 lb 3.2 oz (79.9 kg)   SpO2 99%   BMI 24.57 kg/m      05/01/2023    8:59 AM 05/02/2022    4:25 PM 02/26/2022    9:44 AM  Depression screen PHQ 2/9  Decreased Interest 0 0 0  Down, Depressed, Hopeless 0 0 0  PHQ - 2 Score 0 0 0  Altered sleeping 0 0 0  Tired, decreased energy 0 0 0  Change in appetite 0 0 0  Feeling bad or failure about yourself  0 0 0  Trouble concentrating 0 0 0  Moving slowly or fidgety/restless 0 0 0  Suicidal thoughts 0 0 0  PHQ-9 Score 0 0 0  Difficult doing work/chores  Not difficult at all       05/01/2023    9:00 AM 05/02/2022    4:25 PM 02/26/2022    9:45 AM  GAD 7 : Generalized Anxiety Score  Nervous, Anxious, on Edge 0 0 0  Control/stop worrying 0 0 0  Worry too much - different things 0 0 0  Trouble relaxing 0 0 0  Restless 0 0 0  Easily annoyed or irritable 0 0 0  Afraid - awful might happen 0 0 0  Total GAD 7 Score 0 0 0  Anxiety Difficulty  Not difficult at all     Physical Exam Constitutional:      Appearance: Normal appearance.  HENT:     Head: Normocephalic and atraumatic.     Right Ear: Tympanic membrane normal.     Left Ear: Tympanic membrane normal.     Ears:     Comments: B/L narrow ear canal    Mouth/Throat:     Mouth: Mucous membranes are moist.  Eyes:      General:        Right eye: No discharge.  Neck:     Thyroid: No thyroid mass or thyroid tenderness.  Cardiovascular:     Rate and Rhythm: Normal rate and regular rhythm.  Pulmonary:     Effort: Pulmonary effort is normal.     Breath sounds: Normal breath sounds.  Abdominal:     General: Bowel sounds are normal. There is no distension.     Palpations: Abdomen is soft.  Musculoskeletal:     Right hand: No swelling or bony tenderness. Normal range of motion. Normal strength. Normal pulse.     Left hand: No swelling or bony tenderness. Normal range of motion. Normal strength. Normal pulse.     Cervical back: Neck supple. No rigidity.     Right lower leg: Edema (mild swelling of right lower leg) present.     Left lower leg: No edema.  Skin:    General: Skin is warm.  Neurological:     Mental Status: He is alert and oriented to person, place, and time.  Psychiatric:        Mood and Affect: Mood normal.        Behavior: Behavior normal.        No results found for any visits on 05/01/23.  The ASCVD Risk score (Arnett DK, et al., 2019) failed to calculate for the following reasons:   Risk score cannot be calculated because patient has a medical history suggesting prior/existing ASCVD    Assessment & Plan:  Essential hypertension Assessment & Plan: Chronic.  Reviewed blood pressure goal less than 130 x 80 mmHg.  Since patient has not been taking metoprolol 25 mg twice a day I anticipate improvement in his blood pressure with starting to take metoprolol 25 mg twice a day and continuing with amlodipine 5 mg and losartan 100 mg daily.  Refill sent.  Will check kidney function and electrolytes today.  Orders: -     Losartan Potassium; Take 1 tablet (100 mg total) by mouth daily.  Dispense: 90 tablet; Refill: 1 -     Comprehensive metabolic panel with GFR -     TSH  Hyperlipidemia, unspecified hyperlipidemia type Assessment & Plan: On atorvastatin 40 mg daily.  Continue.  We will  check lipid panel today.  Orders: -     Atorvastatin Calcium; Take 1 tablet (40 mg total) by mouth daily.  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  Does not recall last episode.  Continue.  Refill sent today.  Orders: -     Famciclovir; Take 1 tablet (250 mg total) by mouth daily.  Dispense: 90 tablet; Refill: 3  Atrial fibrillation, unspecified type [facility]) Assessment & Plan: Chronic, not on blood thinner.  Continue follow-up with cardiology.  Orders: -     Metoprolol Tartrate; Take 1 tablet (25 mg total) by mouth 2 (two) times daily.  Dispense: 180 tablet; Refill: 3  Gastroesophageal reflux disease without esophagitis Assessment & Plan: Symptoms currently stable on omeprazole 20 mg twice a day and famotidine 20 mg at bedtime, continue.  Recommend given patient's extensive history with esophageal cancer, stricture needing dilation establish care with GI.  Patient agreeable.  External referral to [facility] clinic was made today.  Orders: -     Omeprazole; Take 1 capsule (20 mg total) by mouth in the morning and at bedtime.  Dispense: 180 capsule; Refill: 3  Sleeping difficulty Assessment & Plan: Stable on trazodone 50 mg once a day.  Continue refill sent today.  Orders: -     traZODone HCl; Take 1 tablet (50 mg total) by mouth at bedtime.  Dispense: 90 tablet; Refill: 3 -     Ambulatory referral to Gastroenterology  Anemia, unspecified type Assessment & Plan: Repeat CBC, B12 today.   Orders: -     CBC with Differential/Platelet  Encounter for screening examination for intermediate hyperglycemia and diabetes mellitus Assessment & Plan: Recommend checking hemoglobin A1c, lab ordered.  Orders: -     Hemoglobin A1c  Other long term (current) drug therapy Assessment & Plan: On chronic PPI.  Check B12, CBC today.   Orders: -     Vitamin  B12  Malignant neoplasm of esophagus, unspecified location [place]) Assessment & Plan: F/U with [facility] in the past. No new concern today. Referral to GI made.    Edema of right lower leg Assessment & Plan: Varicose veins noted.  Likely related to venous insufficiency.  Recommend elevating legs, wearing compression stocking to help reduce swelling.  If this becomes a persistent problem patient will reach out to us .   Bilateral hand pain Assessment & Plan: Physical exam findings normal today.  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  Recommend stretching, icing when pain occurs.  If symptoms become bothersome I recommend patient reach out to us  and we can refer him to occupational therapy for exercises.  Can consider getting x-ray in the future if pain, weakness persists.   Other orders -     amLODIPine Besylate; Take 1 tablet (5 mg total) by mouth daily.  Dispense: 90 tablet; Refill: 3    Return in about 6 months (around 10/31/2023) for Med check.   Jacklin Mascot, MD

## 2023-05-01 NOTE — Assessment & Plan Note (Signed)
 F/U with [facility] in the past. No new concern today. Referral to GI made.

## 2023-05-01 NOTE — Assessment & Plan Note (Signed)
 Stable on trazodone 50 mg once a day.  Continue refill sent today.

## 2023-05-01 NOTE — Assessment & Plan Note (Signed)
 Repeat CBC, B12 today.

## 2023-05-01 NOTE — Assessment & Plan Note (Signed)
 On atorvastatin 40 mg daily.  Continue.  We will check lipid panel today.

## 2023-05-01 NOTE — Assessment & Plan Note (Signed)
 Chronic.  Reviewed blood pressure goal less than 130 x 80 mmHg.  Since patient has not been taking metoprolol 25 mg twice a day I anticipate improvement in his blood pressure with starting to take metoprolol 25 mg twice a day and continuing with amlodipine 5 mg and losartan 100 mg daily.  Refill sent.  Will check kidney function and electrolytes today.

## 2023-05-01 NOTE — Assessment & Plan Note (Signed)
 Chronic, not on blood thinner.  Continue follow-up with cardiology.

## 2023-05-01 NOTE — Assessment & Plan Note (Signed)
 Symptoms currently stable on omeprazole 20 mg twice a day and famotidine 20 mg at bedtime, continue.  Recommend given patient's extensive history with esophageal cancer, stricture needing dilation establish care with GI.  Patient agreeable.  External referral to [facility] clinic was made today.

## 2023-05-01 NOTE — Assessment & Plan Note (Signed)
 Recommend checking hemoglobin A1c, lab ordered.

## 2023-05-01 NOTE — Assessment & Plan Note (Signed)
 On chronic PPI.  Check B12, CBC today.

## 2023-05-01 NOTE — Progress Notes (Signed)
 1. Anemia, unspecified type (Primary) - CBC w/Diff; Future - Iron, TIBC and Ferritin Panel; Future - B12 and Folate Panel; Future   Jacklin Mascot, MD

## 2023-05-07 NOTE — Progress Notes (Unsigned)
  Electrophysiology Office Follow up Visit Note:    Date:  05/08/2023   ID:  Andrew Mullins, DOB 1955-11-16, MRN [id number]  PCP:  Andrew Mascot, MD  [facility] Cardiologist:  Andrew Delton, MD  [facility] Electrophysiologist:  Andrew Byes, MD    Interval History:      Andrew Mullins is a 68 y.o. male who presents for a follow up visit.  I last saw the patient May 02, 2022 for paroxysmal atrial fibrillation.  A loop recorder was implanted at that appointment for atrial fibrillation surveillance in an effort to avoid long-term exposure to anticoagulation.  His loop recorder tracings have shown no evidence of atrial fibrillation.  He presents today to discuss removal of his loop recorder or stopping active monitoring.       Past medical, surgical, social and family history were reviewed.  ROS:   Please see the history of present illness.    All other systems reviewed and are negative.  EKGs/Labs/Other Studies Reviewed:    The following studies were reviewed today:          Physical Exam:    VS:  BP (!) 142/76   Pulse (!) 57   Resp (!) 99   Ht 5\' 11"  (1.803 m)   Wt 175 lb 12.8 oz (79.7 kg)   BMI 24.52 kg/m     Wt Readings from Last 3 Encounters:  05/08/23 175 lb 12.8 oz (79.7 kg)  05/01/23 176 lb 3.2 oz (79.9 kg)  04/25/23 173 lb (78.5 kg)     GEN: no distress CARD: RRR, No MRG RESP: No IWOB. CTAB.      ASSESSMENT:    1. Paroxysmal atrial fibrillation (HCC)   2. Primary hypertension    PLAN:    In order of problems listed above:  #Paroxysmal atrial fibrillation #Loop recorder in situ No recurrence since loop recorder was implanted approximately 1 year ago.  On aspirin  81 mg by mouth once daily We discussed his loop recorder during today's visit.  We discussed removal of the loop recorder versus stopping active monitoring.  He would like to leave the loop recorder in place but just stop monitoring the heart rhythms.  He  understands that if he were to have an arrhythmia, no one would receive an alert.  He understands that there is theoretically an increased risk of stroke with this strategy given we would not have any knowledge of an atrial fibrillation recurrence unless he is symptomatic.  He understands this and wishes to proceed with this strategy.  I do think this is reasonable.  He will let us  know if he has any symptoms and we can always bring him into clinic for a loop recorder interrogation.   #Hypertension Above goal today.  Recommend checking blood pressures 1-2 times per week at home and recording the values.  Recommend bringing these recordings to the primary care physician. Continue amlodipine , losartan   Follow-up with EP on an as-needed basis.  Signed, Harvie Liner, MD, [facility], [facility] 05/08/2023 8:56 AM    Electrophysiology [facility]

## 2023-05-08 ENCOUNTER — Ambulatory Visit: Attending: Cardiology | Admitting: Cardiology

## 2023-05-08 ENCOUNTER — Encounter: Payer: Self-pay | Admitting: Cardiology

## 2023-05-08 VITALS — BP 142/76 | HR 57 | Resp 99 | Ht 71.0 in | Wt 175.8 lb

## 2023-05-08 DIAGNOSIS — I480 Paroxysmal atrial fibrillation: Secondary | ICD-10-CM | POA: Insufficient documentation

## 2023-05-08 DIAGNOSIS — I10 Essential (primary) hypertension: Secondary | ICD-10-CM | POA: Diagnosis present

## 2023-05-08 NOTE — Patient Instructions (Signed)
 Medication Instructions:  Your physician recommends that you continue on your current medications as directed. Please refer to the Current Medication list given to you today.  *If you need a refill on your cardiac medications before your next appointment, please call your pharmacy*  Follow-Up: At [facility], you and your health needs are our priority.  As part of our continuing mission to provide you with exceptional heart care, our providers are all part of one team.  This team includes your primary Cardiologist (physician) and Advanced Practice Providers or APPs (Physician Assistants and Nurse Practitioners) who all work together to provide you with the care you need, when you need it.  Your next appointment:   As needed with EP

## 2023-05-17 ENCOUNTER — Ambulatory Visit
Admission: RE | Admit: 2023-05-17 | Discharge: 2023-05-17 | Disposition: A | Discharge status: Home / Self Care | Source: Ambulatory Visit | Attending: Neurosurgery | Admitting: Neurosurgery

## 2023-05-17 DIAGNOSIS — M546 Pain in thoracic spine: Secondary | ICD-10-CM | POA: Diagnosis present

## 2023-05-17 DIAGNOSIS — M4644 Discitis, unspecified, thoracic region: Secondary | ICD-10-CM | POA: Diagnosis present

## 2023-05-17 DIAGNOSIS — G8929 Other chronic pain: Secondary | ICD-10-CM | POA: Insufficient documentation

## 2023-05-17 DIAGNOSIS — M532X4 Spinal instabilities, thoracic region: Secondary | ICD-10-CM | POA: Insufficient documentation

## 2023-05-17 MED ORDER — GADOBUTROL 1 MMOL/ML IV SOLN
7.5000 mL | Freq: Once | INTRAVENOUS | Status: AC | PRN
  Administered 2023-05-17: 7.5 mL via INTRAVENOUS

## 2023-05-24 ENCOUNTER — Encounter: Payer: Self-pay | Admitting: Neurosurgery

## 2023-06-21 IMAGING — MR MR ANKLE*L* W/O CM
5 series · 40 of 40 positions shown · non-contrast
Comparison: None.

CLINICAL DATA: Status post fall 4 months ago. Pain radiates down
the left leg.

EXAM:
MRI OF THE LEFT ANKLE WITHOUT CONTRAST
TECHNIQUE: Multiplanar, multisequence MR imaging of the ankle was performed. No
intravenous contrast was administered.

[Series 3: PD fat-sat · axial · left · 3.0mm · 0.50mm/px · z∈[-58,+81]mm · 10 of 36 slices shown]
[im 1/36]
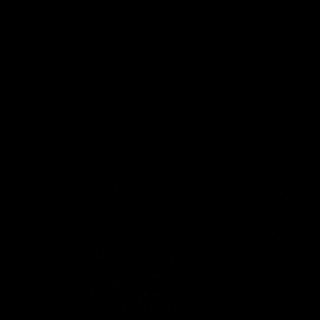
[im 4/36]
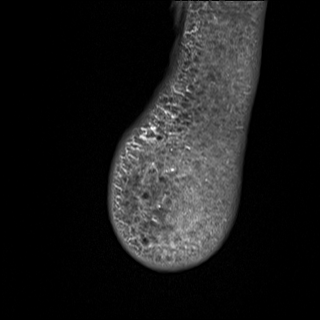
[im 8/36]
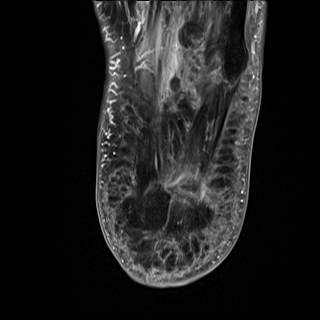
[im 12/36]
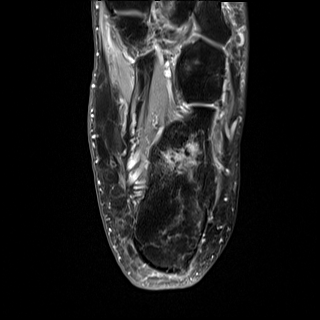
[im 16/36]
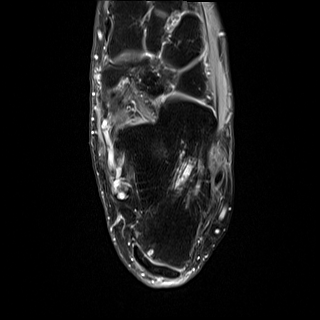
[im 20/36]
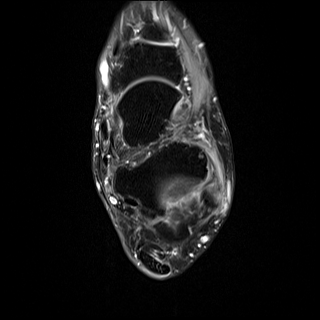
[im 24/36]
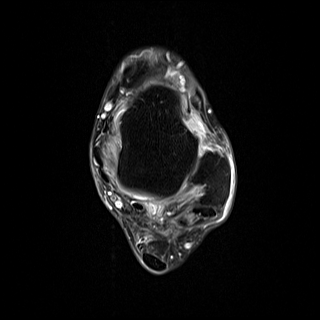
[im 28/36]
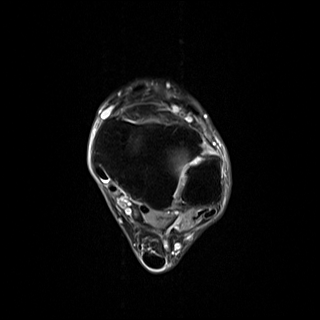
[im 32/36]
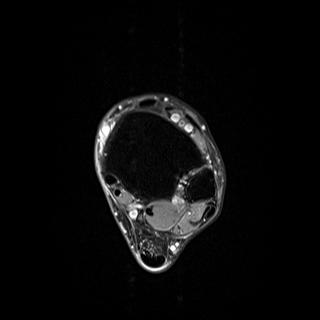
[im 36/36]
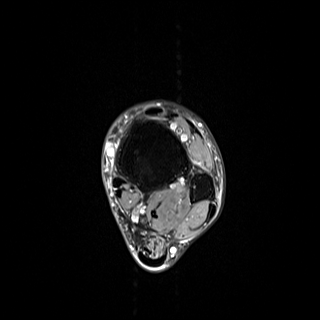

[Series 4: T2 fat-sat · axial · left · 3.0mm · 0.50mm/px · z∈[-58,+81]mm · 10 of 36 slices shown]
[im 1/36]
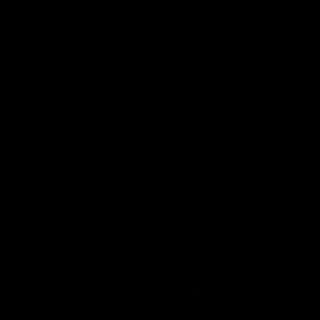
[im 4/36]
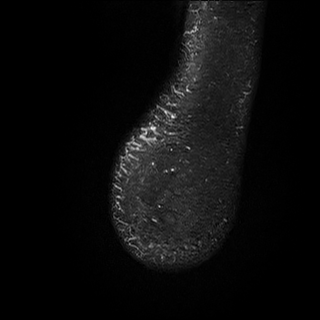
[im 8/36]
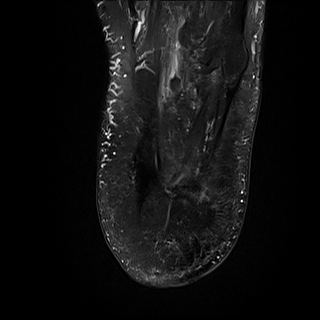
[im 12/36]
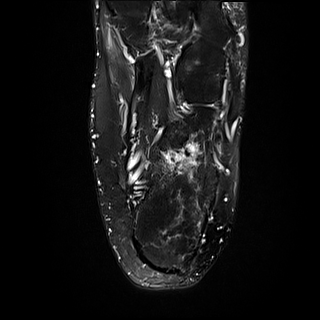
[im 16/36]
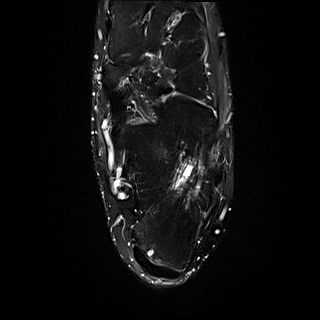
[im 20/36]
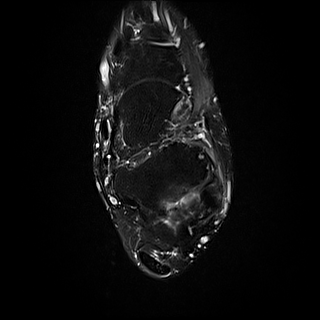
[im 24/36]
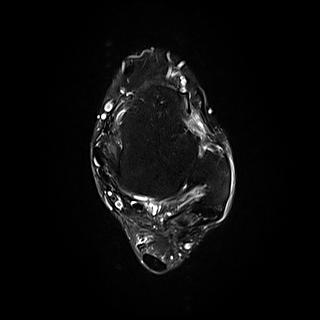
[im 28/36]
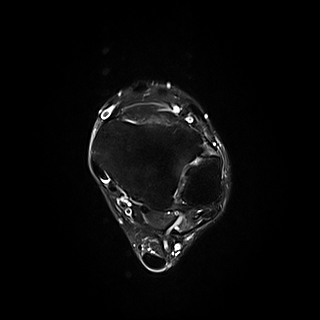
[im 32/36]
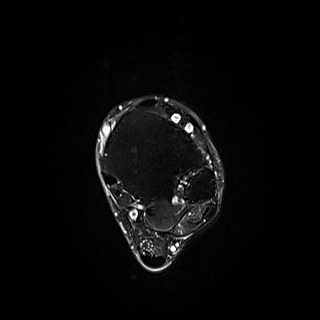
[im 36/36]
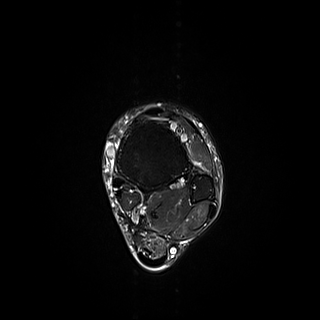

[Series 6: T2 · coronal · left · 3.0mm · 0.62mm/px · 10 of 40 slices shown]
[im 1/40]
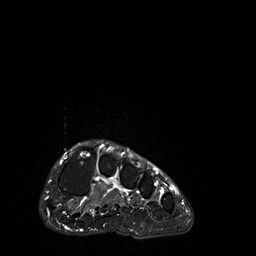
[im 5/40]
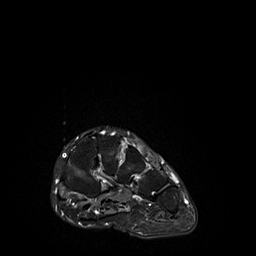
[im 9/40]
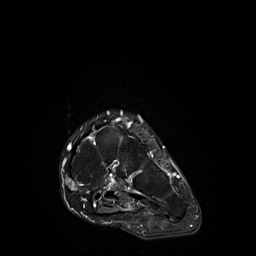
[im 14/40]
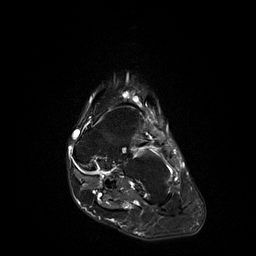
[im 18/40]
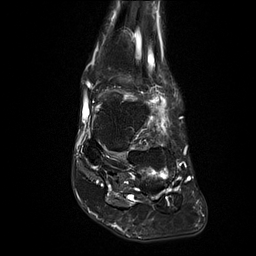
[im 22/40]
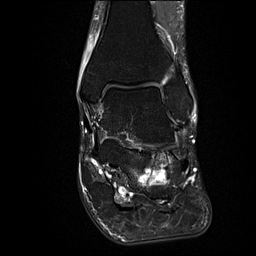
[im 27/40]
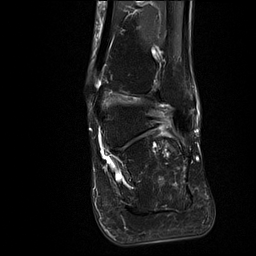
[im 31/40]
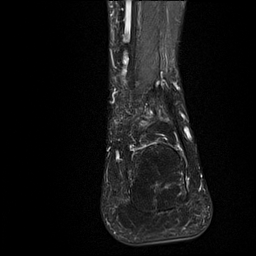
[im 35/40]
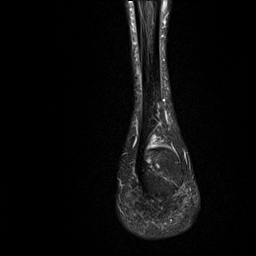
[im 40/40]
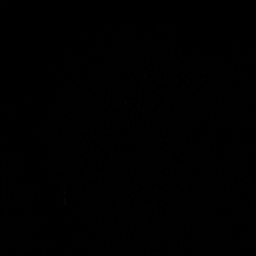

[Series 7: T1 · sagittal · left · 4.0mm · 0.70mm/px · 5 of 21 slices shown]
[im 1/21]
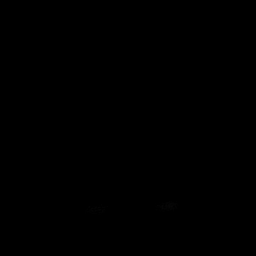
[im 6/21]
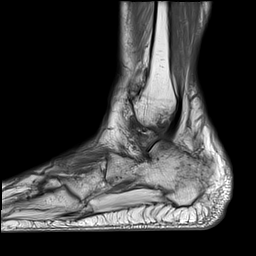
[im 11/21]
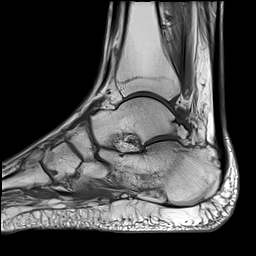
[im 16/21]
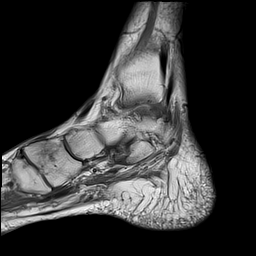
[im 21/21]
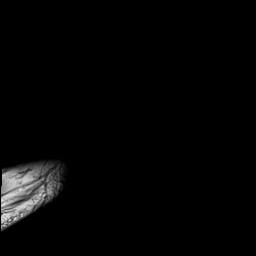

[Series 8: STIR · sagittal · left · 4.0mm · 0.35mm/px · 5 of 21 slices shown]
[im 1/21]
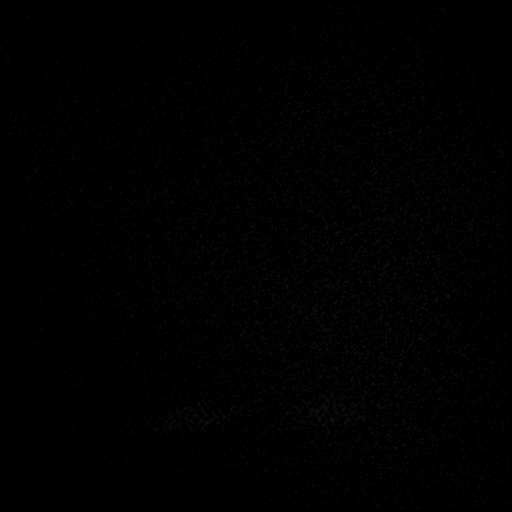
[im 6/21]
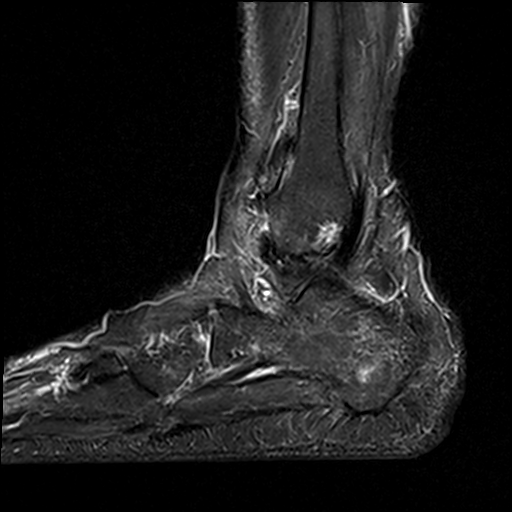
[im 11/21]
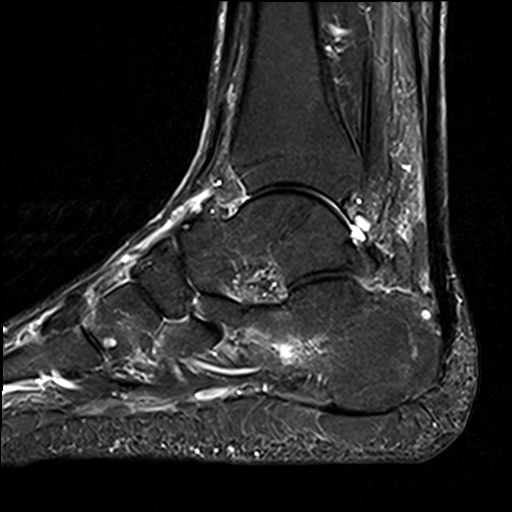
[im 16/21]
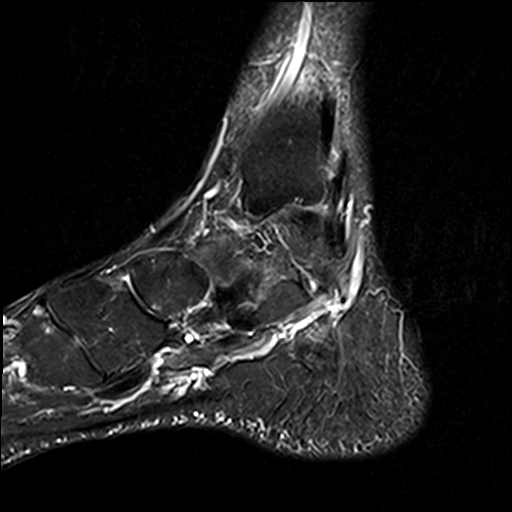
[im 21/21]
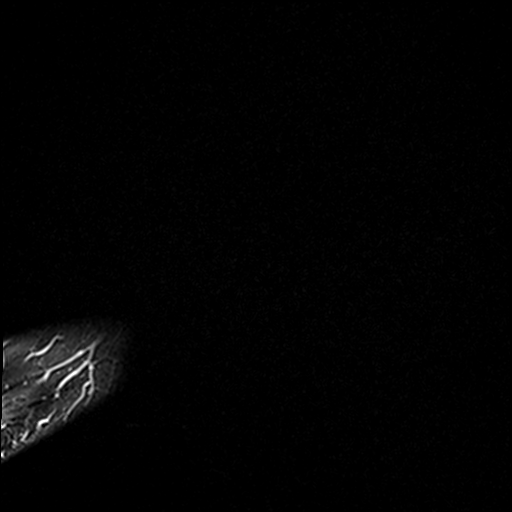

[40 of 40 positions shown; findings below may reference images not displayed]

FINDINGS: TENDONS

Peroneal: Peroneal longus tendon intact. Mild tendinosis of the
peroneus brevis.

Posteromedial: Posterior tibial tendon intact. Flexor hallucis
longus tendon intact. Flexor digitorum longus tendon intact.

Anterior: Tibialis anterior tendon intact. Extensor hallucis longus
tendon intact Extensor digitorum longus tendon intact.

Achilles:  Intact.

Plantar Fascia: Intact.

LIGAMENTS

Lateral: Thickening of the anterior talofibular ligament as can be
seen with prior injury without a complete tear. Calcaneofibular
ligament intact. Posterior talofibular ligament intact. Anterior and
posterior tibiofibular ligaments intact.

Medial: Deltoid ligament intact. Spring ligament intact.

CARTILAGE

Ankle Joint: No joint effusion. Normal ankle mortise. No chondral
defect.

Subtalar Joints/Sinus Tarsi: Subchondral bone marrow edema in the
posterior facet of the calcaneus at the posterior subtalar joint
with cortical irregularity (image 15/series 8) concerning for a
subacute nondisplaced subchondral fracture. Normal sinus tarsi. No
subtalar joint effusion.

Bones: No marrow signal abnormality. No fracture or dislocation.
Prominent intraosseous vasculature in the mid calcaneus.

Soft Tissue: No fluid collection or hematoma. Muscles are normal
without edema or atrophy. Tarsal tunnel is normal.
IMPRESSION: 1. Subchondral bone marrow edema in the posterior facet of the
calcaneus at the posterior subtalar joint with cortical irregularity
concerning for a subacute nondisplaced subchondral fracture.
2. Mild tendinosis of the peroneus brevis.

## 2023-06-28 ENCOUNTER — Other Ambulatory Visit (INDEPENDENT_AMBULATORY_CARE_PROVIDER_SITE_OTHER)

## 2023-06-28 DIAGNOSIS — D649 Anemia, unspecified: Secondary | ICD-10-CM

## 2023-06-28 NOTE — Addendum Note (Signed)
 Addended by: Lindle Rhea on: 06/28/2023 08:57 AM   Modules accepted: Orders

## 2023-06-29 ENCOUNTER — Ambulatory Visit: Payer: Self-pay

## 2023-06-29 DIAGNOSIS — D509 Iron deficiency anemia, unspecified: Secondary | ICD-10-CM

## 2023-06-29 LAB — CBC WITH DIFFERENTIAL/PLATELET
Absolute Lymphocytes: 1635 {cells}/uL (ref 850–3900)
Absolute Monocytes: 760 {cells}/uL (ref 200–950)
Basophils Absolute: 30 {cells}/uL (ref 0–200)
Basophils Relative: 0.6 %
Eosinophils Absolute: 120 {cells}/uL (ref 15–500)
Eosinophils Relative: 2.4 %
HCT: 34.9 % — ABNORMAL LOW (ref 38.5–50.0)
Hemoglobin: 11 g/dL — ABNORMAL LOW (ref 13.2–17.1)
MCH: 25.9 pg — ABNORMAL LOW (ref 27.0–33.0)
MCHC: 31.5 g/dL — ABNORMAL LOW (ref 32.0–36.0)
MCV: 82.1 fL (ref 80.0–100.0)
MPV: 10.9 fL (ref 7.5–12.5)
Monocytes Relative: 15.2 %
Neutro Abs: 2455 {cells}/uL (ref 1500–7800)
Neutrophils Relative %: 49.1 %
Platelets: 167 10*3/uL (ref 140–400)
RBC: 4.25 10*6/uL (ref 4.20–5.80)
RDW: 13.7 % (ref 11.0–15.0)
Total Lymphocyte: 32.7 %
WBC: 5 10*3/uL (ref 3.8–10.8)

## 2023-06-29 LAB — B12 AND FOLATE PANEL
Folate: 12.4 ng/mL
Vitamin B-12: 409 pg/mL (ref 200–1100)

## 2023-06-29 LAB — IRON,TIBC AND FERRITIN PANEL
%SAT: 14 % — ABNORMAL LOW (ref 20–48)
Ferritin: 7 ng/mL — ABNORMAL LOW (ref 24–380)
Iron: 50 ug/dL (ref 50–180)
TIBC: 365 ug/dL (ref 250–425)

## 2023-06-29 MED ORDER — FERROUS SULFATE 324 (65 FE) MG PO TBEC: 1.0000 | DELAYED_RELEASE_TABLET | Freq: Two times a day (BID) | ORAL | 0 refills | Status: AC

## 2023-06-29 MED ORDER — FERROUS SULFATE 324 (65 FE) MG PO TBEC: 1.0000 | DELAYED_RELEASE_TABLET | Freq: Every day | ORAL | 0 refills | Status: DC

## 2023-07-17 ENCOUNTER — Other Ambulatory Visit: Payer: Self-pay

## 2023-07-17 DIAGNOSIS — D509 Iron deficiency anemia, unspecified: Secondary | ICD-10-CM

## 2023-08-26 ENCOUNTER — Ambulatory Visit: Admitting: Cardiology

## 2023-10-31 ENCOUNTER — Ambulatory Visit

## 2023-12-16 ENCOUNTER — Encounter

## 2024-01-13 ENCOUNTER — Other Ambulatory Visit: Payer: Self-pay

## 2024-01-13 DIAGNOSIS — I10 Essential (primary) hypertension: Secondary | ICD-10-CM

## 2024-01-14 ENCOUNTER — Other Ambulatory Visit: Payer: Self-pay

## 2024-01-14 DIAGNOSIS — I10 Essential (primary) hypertension: Secondary | ICD-10-CM

## 2024-01-14 NOTE — Telephone Encounter (Signed)
 Spoke with pt and he is no longer a pt here at our office. Refill has been denied.

## 2024-01-19 ENCOUNTER — Other Ambulatory Visit: Payer: Self-pay

## 2024-01-19 DIAGNOSIS — I10 Essential (primary) hypertension: Secondary | ICD-10-CM
